# Patient Record
Sex: Female | Born: 1940 | Race: White | Hispanic: No | Marital: Single | Smoking: Never smoker
Health system: Southern US, Community
[De-identification: ages and names within clinical notes are randomized; demographics above are authoritative.]

## PROBLEM LIST (undated history)

## (undated) DIAGNOSIS — I10 Essential (primary) hypertension: Secondary | ICD-10-CM

## (undated) DIAGNOSIS — R079 Chest pain, unspecified: Secondary | ICD-10-CM

## (undated) DIAGNOSIS — G20 Parkinson's disease: Secondary | ICD-10-CM

## (undated) DIAGNOSIS — Z7289 Other problems related to lifestyle: Secondary | ICD-10-CM

## (undated) DIAGNOSIS — I5189 Other ill-defined heart diseases: Secondary | ICD-10-CM

## (undated) DIAGNOSIS — F411 Generalized anxiety disorder: Secondary | ICD-10-CM

## (undated) DIAGNOSIS — R296 Repeated falls: Secondary | ICD-10-CM

## (undated) DIAGNOSIS — G20A1 Parkinson's disease without dyskinesia, without mention of fluctuations: Secondary | ICD-10-CM

## (undated) DIAGNOSIS — Z789 Other specified health status: Secondary | ICD-10-CM

## (undated) DIAGNOSIS — F329 Major depressive disorder, single episode, unspecified: Secondary | ICD-10-CM

## (undated) DIAGNOSIS — E039 Hypothyroidism, unspecified: Secondary | ICD-10-CM

## (undated) DIAGNOSIS — F410 Panic disorder [episodic paroxysmal anxiety] without agoraphobia: Secondary | ICD-10-CM

## (undated) DIAGNOSIS — F32A Depression, unspecified: Secondary | ICD-10-CM

## (undated) DIAGNOSIS — W19XXXA Unspecified fall, initial encounter: Secondary | ICD-10-CM

## (undated) DIAGNOSIS — F1090 Alcohol use, unspecified, uncomplicated: Secondary | ICD-10-CM

## (undated) DIAGNOSIS — E785 Hyperlipidemia, unspecified: Secondary | ICD-10-CM

## (undated) HISTORY — DX: Hypothyroidism, unspecified: E03.9

## (undated) HISTORY — PX: AUGMENTATION MAMMAPLASTY: SUR837

## (undated) HISTORY — PX: DILATION AND CURETTAGE OF UTERUS: SHX78

## (undated) HISTORY — DX: Parkinson's disease: G20

## (undated) HISTORY — DX: Parkinson's disease without dyskinesia, without mention of fluctuations: G20.A1

---

## 2000-06-04 ENCOUNTER — Inpatient Hospital Stay (HOSPITAL_COMMUNITY): Admission: EM | Admit: 2000-06-04 | Discharge: 2000-06-07 | Payer: Self-pay | Admitting: *Deleted

## 2000-06-10 ENCOUNTER — Other Ambulatory Visit (HOSPITAL_COMMUNITY): Admission: RE | Admit: 2000-06-10 | Discharge: 2000-06-28 | Payer: Self-pay | Admitting: Psychiatry

## 2004-06-08 ENCOUNTER — Ambulatory Visit: Payer: Self-pay | Admitting: Psychiatry

## 2004-08-29 ENCOUNTER — Ambulatory Visit: Payer: Self-pay | Admitting: Psychiatry

## 2004-11-01 ENCOUNTER — Ambulatory Visit (HOSPITAL_COMMUNITY): Admission: RE | Admit: 2004-11-01 | Discharge: 2004-11-01 | Payer: Self-pay | Admitting: Family Medicine

## 2004-11-14 ENCOUNTER — Ambulatory Visit: Payer: Self-pay | Admitting: Psychiatry

## 2005-02-13 ENCOUNTER — Ambulatory Visit: Payer: Self-pay | Admitting: Psychiatry

## 2005-05-29 ENCOUNTER — Ambulatory Visit: Payer: Self-pay | Admitting: Psychiatry

## 2005-05-31 ENCOUNTER — Other Ambulatory Visit: Admission: RE | Admit: 2005-05-31 | Discharge: 2005-05-31 | Payer: Self-pay | Admitting: Dermatology

## 2005-08-28 ENCOUNTER — Ambulatory Visit: Payer: Self-pay | Admitting: Psychiatry

## 2005-10-30 ENCOUNTER — Ambulatory Visit (HOSPITAL_COMMUNITY): Payer: Self-pay | Admitting: Psychiatry

## 2005-11-20 ENCOUNTER — Ambulatory Visit (HOSPITAL_COMMUNITY): Payer: Self-pay | Admitting: Psychiatry

## 2005-12-27 ENCOUNTER — Ambulatory Visit (HOSPITAL_COMMUNITY): Payer: Self-pay | Admitting: Psychiatry

## 2006-02-26 ENCOUNTER — Ambulatory Visit (HOSPITAL_COMMUNITY): Payer: Self-pay | Admitting: Psychiatry

## 2006-04-25 ENCOUNTER — Ambulatory Visit (HOSPITAL_COMMUNITY): Payer: Self-pay | Admitting: Psychiatry

## 2006-06-27 ENCOUNTER — Ambulatory Visit (HOSPITAL_COMMUNITY): Payer: Self-pay | Admitting: Psychiatry

## 2006-08-27 ENCOUNTER — Ambulatory Visit (HOSPITAL_COMMUNITY): Payer: Self-pay | Admitting: Psychiatry

## 2006-09-03 ENCOUNTER — Ambulatory Visit (HOSPITAL_COMMUNITY): Payer: Self-pay | Admitting: Psychiatry

## 2006-10-04 ENCOUNTER — Ambulatory Visit (HOSPITAL_COMMUNITY): Payer: Self-pay | Admitting: Psychiatry

## 2006-10-31 ENCOUNTER — Ambulatory Visit (HOSPITAL_COMMUNITY): Payer: Self-pay | Admitting: Psychiatry

## 2006-11-04 ENCOUNTER — Ambulatory Visit (HOSPITAL_COMMUNITY): Payer: Self-pay | Admitting: Psychiatry

## 2006-12-04 ENCOUNTER — Ambulatory Visit (HOSPITAL_COMMUNITY): Payer: Self-pay | Admitting: Psychiatry

## 2006-12-24 ENCOUNTER — Ambulatory Visit (HOSPITAL_COMMUNITY): Payer: Self-pay | Admitting: Psychiatry

## 2007-02-25 ENCOUNTER — Ambulatory Visit (HOSPITAL_COMMUNITY): Payer: Self-pay | Admitting: Psychiatry

## 2007-04-24 ENCOUNTER — Ambulatory Visit (HOSPITAL_COMMUNITY): Payer: Self-pay | Admitting: Psychiatry

## 2007-05-22 ENCOUNTER — Ambulatory Visit (HOSPITAL_COMMUNITY): Payer: Self-pay | Admitting: Psychiatry

## 2007-07-15 ENCOUNTER — Ambulatory Visit (HOSPITAL_COMMUNITY): Payer: Self-pay | Admitting: Psychiatry

## 2007-09-09 ENCOUNTER — Ambulatory Visit (HOSPITAL_COMMUNITY): Payer: Self-pay | Admitting: Psychiatry

## 2007-12-16 ENCOUNTER — Ambulatory Visit (HOSPITAL_COMMUNITY): Payer: Self-pay | Admitting: Psychiatry

## 2008-02-12 ENCOUNTER — Ambulatory Visit (HOSPITAL_COMMUNITY): Payer: Self-pay | Admitting: Psychiatry

## 2008-04-19 ENCOUNTER — Ambulatory Visit (HOSPITAL_COMMUNITY): Admission: RE | Admit: 2008-04-19 | Discharge: 2008-04-19 | Payer: Self-pay | Admitting: Internal Medicine

## 2010-12-15 NOTE — Discharge Summary (Signed)
[facility]  Patient:    Wendy Reynolds, Wendy Reynolds                         MRN: [id number] Adm. Date:  [id number] Disc. Date: [id number] Attending:  Otilio Saber Dictator:   Candi Leash. Orsini, N.P.                           Discharge Summary  HISTORY OF PRESENT ILLNESS:  This client is a 70 year old white divorced female admitted on a voluntary basis for alcohol abuse with increased intake of alcohol the past weekend prior to admission.  The patient states that she has been drinking heavily, drinking approximately 4 L of wine per day.  The patient was drinking to induce sleep and because of loneliness.  The patient states that she has been a problem drinker for about the past 25 years, denies DTs, blackouts, or seizure.  Her sleep has been poor.  Her appetite has been poor.  She has had weight loss of about 4 pounds in about a month.  She also reports depression.  The patient denies any suicidal ideation or homicidal ideation.  The patient has decreased energy, decreased concentration, anhedonia, and some withdrawal.  PAST PSYCHIATRIC HISTORY:  The patient has had outpatient treatment for the past 11 years, sees Dr. Claudette Head, her psychiatrist at [facility].  PAST MEDICAL HISTORY:  Primary care Amma Crear is Dr. Sherwood Gambler with her medical problems including GERD.  Admission medications: Celexa 40 mg p.o. one q.a.m., BuSpar 50 mg one q.a.m. and q.h.s., Neurontin 100 mg q.a.m. and two q.h.s., Klonopin 1 mg q.h.s.  Drug allergies include ASPIRIN and PENICILLIN.  PHYSICAL EXAMINATION:  Physical examination was performed at [facility].  Blood pressure was mildly elevated at 142/94.  Laboratory data: TSH 8.866, slightly elevated.  CBC with differential: Normal limits.  CMET: Within normal limits except CO2 elevated at 72.  Blood alcohol was 6.  UA showed protein 30, bilirubin 1, leukocytes moderate, bacteria were many.  MENTAL STATUS  EXAMINATION:  Older white female casually dressed, cooperative, thin.  Speech is normal and relevant.  The patient does speak pretty fast. Mood is anxious.  Affect is anxious.  Denies any suicidal or homicidal ideation.  Thought processes are logical and coherent without evidence of psychosis.  No hallucinations, no delusions.  Cognitive: Alert and oriented, cognitive functioning is intact.  Judgment is poor.  ADMITTING DIAGNOSES: Axis I:    1. Major depression, recurrent.            2. Alcohol dependence. Axis II:   Deferred. Axis III:  Gastroesophageal reflux disease. Axis IV:   Severe related to economic problems and substance abuse. Axis V:    Current global assessment of functioning is 30, highest this past            year was 70.  HOSPITAL COURSE:  The patient was admitted to the [facility] for alcohol dependence, major depressive disorder.  The patient was continued on her routine medications and Klonopin was ordered on a q.h.s. basis. Phenobarbital protocol was initiated.  Fluids were ordered at a rate of six bottles per day.  The patient was considered to be a fall risk.  The patient was feeling better today.  Her mood and affect were brighter.  There was no suicidal thoughts, no severe withdrawal symptoms.  Detoxification protocol was continued in addition to her current  medicines.  CONDITION:  The patient reported feeling much better.  Her mood and affect were brighter.  She was sleeping and eating well.  There were no current withdrawal symptoms.  The patient denied any suicidal thoughts.  It was felt the patient could be discharged on an outpatient basis.  FOLLOWUP:  CD IOP on the evening of 06/10/00 at 4 p.m.  DISCHARGE MEDICATIONS: 1. Celexa 40 mg one tablet q.d. 2. BuSpar 50 mg one tablet b.i.d. 3. Neurontin 100 mg one tablet q.a.m., two q.h.s. 4. Klonopin 1 mg one tablet q.h.s. 5. Phenobarbital 15 mg one tablet b.i.d. for the next two  days.  DISCHARGE INSTRUCTIONS:  No activity or diet restrictions.  The patient was advised not to drink.  DISCHARGE DIAGNOSES: Axis I:    1. Major depression, recurrent.            2. Alcohol dependence. Axis II:   Deferred. Axis III:  Gastroesophageal reflux disease. Axis IV:   Severe related to economic problems and substance abuse. Axis V:    Current global assessment of functioning is 55, highest this past            year was 70. DD:  07/17/00 TD:  07/17/00 Job: [id number]

## 2012-03-04 ENCOUNTER — Encounter (HOSPITAL_COMMUNITY): Payer: Self-pay

## 2012-03-04 ENCOUNTER — Emergency Department (HOSPITAL_COMMUNITY): Payer: Medicare PPO

## 2012-03-04 ENCOUNTER — Observation Stay (HOSPITAL_COMMUNITY)
Admission: EM | Admit: 2012-03-04 | Discharge: 2012-03-05 | Disposition: A | Discharge status: Home / Self Care | Payer: Medicare PPO | Attending: Internal Medicine | Admitting: Internal Medicine

## 2012-03-04 DIAGNOSIS — F32A Depression, unspecified: Secondary | ICD-10-CM

## 2012-03-04 DIAGNOSIS — I519 Heart disease, unspecified: Secondary | ICD-10-CM | POA: Insufficient documentation

## 2012-03-04 DIAGNOSIS — I10 Essential (primary) hypertension: Secondary | ICD-10-CM

## 2012-03-04 DIAGNOSIS — I059 Rheumatic mitral valve disease, unspecified: Secondary | ICD-10-CM

## 2012-03-04 DIAGNOSIS — E785 Hyperlipidemia, unspecified: Secondary | ICD-10-CM

## 2012-03-04 DIAGNOSIS — R079 Chest pain, unspecified: Principal | ICD-10-CM

## 2012-03-04 DIAGNOSIS — I5189 Other ill-defined heart diseases: Secondary | ICD-10-CM

## 2012-03-04 DIAGNOSIS — F3289 Other specified depressive episodes: Secondary | ICD-10-CM | POA: Insufficient documentation

## 2012-03-04 DIAGNOSIS — F329 Major depressive disorder, single episode, unspecified: Secondary | ICD-10-CM | POA: Diagnosis present

## 2012-03-04 DIAGNOSIS — F411 Generalized anxiety disorder: Secondary | ICD-10-CM

## 2012-03-04 HISTORY — DX: Chest pain, unspecified: R07.9

## 2012-03-04 HISTORY — DX: Other ill-defined heart diseases: I51.89

## 2012-03-04 HISTORY — DX: Alcohol use, unspecified, uncomplicated: F10.90

## 2012-03-04 HISTORY — DX: Hyperlipidemia, unspecified: E78.5

## 2012-03-04 HISTORY — DX: Other problems related to lifestyle: Z72.89

## 2012-03-04 HISTORY — DX: Generalized anxiety disorder: F41.1

## 2012-03-04 HISTORY — DX: Essential (primary) hypertension: I10

## 2012-03-04 HISTORY — DX: Other specified health status: Z78.9

## 2012-03-04 HISTORY — DX: Panic disorder (episodic paroxysmal anxiety): F41.0

## 2012-03-04 HISTORY — DX: Depression, unspecified: F32.A

## 2012-03-04 HISTORY — DX: Major depressive disorder, single episode, unspecified: F32.9

## 2012-03-04 LAB — CBC WITH DIFFERENTIAL/PLATELET
HCT: 41.8 % (ref 36.0–46.0)
Lymphocytes Relative: 28 % (ref 12–46)
Lymphs Abs: 1.3 10*3/uL (ref 0.7–4.0)
MCHC: 34.4 g/dL (ref 30.0–36.0)
MCV: 91.1 fL (ref 78.0–100.0)
Platelets: 228 10*3/uL (ref 150–400)
RDW: 13.2 % (ref 11.5–15.5)

## 2012-03-04 LAB — BASIC METABOLIC PANEL
CO2: 28 mEq/L (ref 19–32)
GFR calc Af Amer: 82 mL/min — ABNORMAL LOW (ref >90)
GFR calc non Af Amer: 71 mL/min — ABNORMAL LOW (ref >90)
Glucose, Bld: 98 mg/dL (ref 70–99)
Sodium: 139 mEq/L (ref 135–145)

## 2012-03-04 LAB — CARDIAC PANEL(CRET KIN+CKTOT+MB+TROPI)
CK, MB: 4.9 ng/mL — ABNORMAL HIGH (ref 0.3–4.0)
CK, MB: 3.3 ng/mL (ref 0.3–4.0)
Relative Index: 2.7 — ABNORMAL HIGH (ref 0.0–2.5)
Total CK: 172 U/L (ref 7–177)
Troponin I: <0.3 ng/mL (ref <0.30)
Troponin I: <0.3 ng/mL (ref <0.30)

## 2012-03-04 LAB — HEPATIC FUNCTION PANEL
ALT: 19 U/L (ref 0–35)
AST: 29 U/L (ref 0–37)
Bilirubin, Direct: <0.1 mg/dL (ref 0.0–0.3)
Total Bilirubin: 0.4 mg/dL (ref 0.3–1.2)

## 2012-03-04 LAB — PROTIME-INR
INR: 0.95 (ref 0.00–1.49)
Prothrombin Time: 12.9 seconds (ref 11.6–15.2)

## 2012-03-04 LAB — TROPONIN I: Troponin I: <0.3 ng/mL (ref <0.30)

## 2012-03-04 MED ORDER — OMEGA-3-ACID ETHYL ESTERS 1 G PO CAPS
1.0000 g | ORAL_CAPSULE | Freq: Every day | ORAL | Status: DC
  Administered 2012-03-05: 1 g via ORAL
  Filled 2012-03-04: qty 1

## 2012-03-04 MED ORDER — ADULT MULTIVITAMIN W/MINERALS CH
1.0000 | ORAL_TABLET | Freq: Every day | ORAL | Status: DC
  Administered 2012-03-05: 1 via ORAL
  Filled 2012-03-04: qty 1

## 2012-03-04 MED ORDER — BUSPIRONE HCL 5 MG PO TABS
5.0000 mg | ORAL_TABLET | Freq: Two times a day (BID) | ORAL | Status: DC
  Administered 2012-03-04 – 2012-03-05 (×2): 5 mg via ORAL
  Filled 2012-03-04 (×2): qty 1

## 2012-03-04 MED ORDER — ASPIRIN 81 MG PO CHEW
81.0000 mg | CHEWABLE_TABLET | Freq: Every day | ORAL | Status: DC
  Administered 2012-03-05: 81 mg via ORAL
  Filled 2012-03-04: qty 1

## 2012-03-04 MED ORDER — MIRTAZAPINE 30 MG PO TBDP
ORAL_TABLET | ORAL | Status: AC
  Filled 2012-03-04: qty 1

## 2012-03-04 MED ORDER — AMLODIPINE BESYLATE 5 MG PO TABS
5.0000 mg | ORAL_TABLET | Freq: Every day | ORAL | Status: DC
  Administered 2012-03-05: 5 mg via ORAL
  Filled 2012-03-04: qty 1

## 2012-03-04 MED ORDER — BUPROPION HCL 100 MG PO TABS
100.0000 mg | ORAL_TABLET | Freq: Three times a day (TID) | ORAL | Status: DC
  Administered 2012-03-04: 100 mg via ORAL
  Filled 2012-03-04 (×3): qty 1

## 2012-03-04 MED ORDER — ALBUTEROL SULFATE (5 MG/ML) 0.5% IN NEBU: 2.5000 mg | INHALATION_SOLUTION | RESPIRATORY_TRACT | Status: DC | PRN

## 2012-03-04 MED ORDER — ACETAMINOPHEN 325 MG PO TABS: 650.0000 mg | ORAL_TABLET | Freq: Four times a day (QID) | ORAL | Status: DC | PRN

## 2012-03-04 MED ORDER — NITROGLYCERIN 0.4 MG SL SUBL
0.4000 mg | SUBLINGUAL_TABLET | SUBLINGUAL | Status: DC | PRN
  Administered 2012-03-04: 0.4 mg via SUBLINGUAL
  Filled 2012-03-04: qty 25

## 2012-03-04 MED ORDER — PANTOPRAZOLE SODIUM 40 MG PO TBEC
40.0000 mg | DELAYED_RELEASE_TABLET | Freq: Every day | ORAL | Status: DC
  Administered 2012-03-04 – 2012-03-05 (×2): 40 mg via ORAL
  Filled 2012-03-04 (×2): qty 1

## 2012-03-04 MED ORDER — ALUM & MAG HYDROXIDE-SIMETH 200-200-20 MG/5ML PO SUSP: 30.0000 mL | Freq: Four times a day (QID) | ORAL | Status: DC | PRN

## 2012-03-04 MED ORDER — ENOXAPARIN SODIUM 40 MG/0.4ML ~~LOC~~ SOLN
40.0000 mg | SUBCUTANEOUS | Status: DC
  Administered 2012-03-04: 40 mg via SUBCUTANEOUS
  Filled 2012-03-04: qty 0.4

## 2012-03-04 MED ORDER — POLYVINYL ALCOHOL 1.4 % OP SOLN
1.0000 [drp] | Freq: Two times a day (BID) | OPHTHALMIC | Status: DC
  Filled 2012-03-04: qty 15

## 2012-03-04 MED ORDER — POLYETHYL GLYCOL-PROPYL GLYCOL 0.4-0.3 % OP SOLN
1.0000 [drp] | Freq: Two times a day (BID) | OPHTHALMIC | Status: DC
  Administered 2012-03-04: 1 [drp] via OPHTHALMIC

## 2012-03-04 MED ORDER — BUPROPION HCL 75 MG PO TABS
ORAL_TABLET | ORAL | Status: AC
  Filled 2012-03-04: qty 2

## 2012-03-04 MED ORDER — ONDANSETRON HCL 4 MG PO TABS: 4.0000 mg | ORAL_TABLET | Freq: Four times a day (QID) | ORAL | Status: DC | PRN

## 2012-03-04 MED ORDER — MORPHINE SULFATE 2 MG/ML IJ SOLN: 2.0000 mg | INTRAMUSCULAR | Status: DC | PRN

## 2012-03-04 MED ORDER — ASPIRIN 81 MG PO CHEW
243.0000 mg | CHEWABLE_TABLET | Freq: Once | ORAL | Status: AC
  Administered 2012-03-04: 243 mg via ORAL
  Filled 2012-03-04: qty 3

## 2012-03-04 MED ORDER — ONDANSETRON HCL 4 MG/2ML IJ SOLN: 4.0000 mg | Freq: Four times a day (QID) | INTRAMUSCULAR | Status: DC | PRN

## 2012-03-04 MED ORDER — MIRTAZAPINE 30 MG PO TBDP
30.0000 mg | ORAL_TABLET | Freq: Every day | ORAL | Status: DC
  Administered 2012-03-04: 30 mg via ORAL
  Filled 2012-03-04: qty 1

## 2012-03-04 MED ORDER — ACETAMINOPHEN 650 MG RE SUPP: 650.0000 mg | Freq: Four times a day (QID) | RECTAL | Status: DC | PRN

## 2012-03-04 MED ORDER — OXYCODONE HCL 5 MG PO TABS: 5.0000 mg | ORAL_TABLET | ORAL | Status: DC | PRN

## 2012-03-04 NOTE — ED Notes (Signed)
MD at bedside. 

## 2012-03-04 NOTE — ED Provider Notes (Signed)
History    This chart was scribed for Brittanya Winburn B. Bernette Mayers, MD, MD by Smitty Pluck. The patient was seen in room APA05 and the patient's care was started at 9:57AM.   CSN: [id number]  Arrival date & time 03/04/12  4696   First MD Initiated Contact with Patient 03/04/12 [phone]      Chief Complaint  Patient presents with  . Chest Pain    (Consider location/radiation/quality/duration/timing/severity/associated sxs/prior treatment) The history is provided by the patient.   Wendy Reynolds is a 71 y.o. female who presents to the Emergency Department sent by PCP (Dr. Sherwood Gambler) complaining of moderate mid sternal chest pain radiating int neck onset 1 day ago. Pt reports that she has tightness feeling in her chest. Pain lasted a couple hours x2 yesterday and returned this AM. Pain has been constant since 6AM today. Denies SOB, nausea, emesis and diaphoresis. Pt has had 81mg  ASA this AM. Denies hx of heart problems and diabetes. Pt reports having HTN. Denies smoking cigarettes. Pt drinks 2-3 glasses of wine daily.  Past Medical History  Diagnosis Date  . Anxiety   . Panic   . Hypertension   . Depression     History reviewed. No pertinent past surgical history.  No family history on file.  History  Substance Use Topics  . Smoking status: Never Smoker   . Smokeless tobacco: Not on file  . Alcohol Use: Yes     occ    OB History    Grav Para Term Preterm Abortions TAB SAB Ect Mult Living                  Review of Systems  All other systems reviewed and are negative.  10 Systems reviewed and all are negative for acute change except as noted in the HPI.   Allergies  Erythromycin and Penicillins  Home Medications  No current outpatient prescriptions on file.  BP 127/74  Pulse 72  Temp 97.7 F (36.5 C) (Oral)  Resp 17  Ht 5\' 2"  (1.575 m)  Wt 102 lb (46.267 kg)  BMI 18.66 kg/m2  SpO2 94%  Physical Exam  Nursing note and vitals reviewed. Constitutional: She is oriented to  person, place, and time. She appears well-developed and well-nourished.  HENT:  Head: Normocephalic and atraumatic.  Eyes: EOM are normal. Pupils are equal, round, and reactive to light.  Neck: Normal range of motion. Neck supple.  Cardiovascular: Normal rate, normal heart sounds and intact distal pulses.   Pulmonary/Chest: Effort normal and breath sounds normal.  Abdominal: Bowel sounds are normal. She exhibits no distension. There is no tenderness.  Musculoskeletal: Normal range of motion. She exhibits no edema and no tenderness.  Neurological: She is alert and oriented to person, place, and time. She has normal strength. No cranial nerve deficit or sensory deficit.  Skin: Skin is warm and dry. No rash noted.  Psychiatric: She has a normal mood and affect.    ED Course  Procedures (including critical care time) DIAGNOSTIC STUDIES: Oxygen Saturation is 98% on room air, normal by my interpretation.    COORDINATION OF CARE: 10:01AM EDP discusses pt ED treatment with pt  10:01AM EDP orders medication: Scheduled Meds:    . aspirin  243 mg Oral Once   Continuous Infusions:  PRN Meds:.nitroGLYCERIN      Labs Reviewed  BASIC METABOLIC PANEL - Abnormal; Notable for the following:    GFR calc non Af Amer 71 (*)     GFR calc  Af Amer 82 (*)     All other components within normal limits  CARDIAC PANEL(CRET KIN+CKTOT+MB+TROPI) - Abnormal; Notable for the following:    CK, MB 4.9 (*)     Relative Index 2.8 (*)     All other components within normal limits  CBC WITH DIFFERENTIAL  TROPONIN I  CARDIAC PANEL(CRET KIN+CKTOT+MB+TROPI)  CARDIAC PANEL(CRET KIN+CKTOT+MB+TROPI)  HEPATIC FUNCTION PANEL  TSH  T4, FREE  LIPASE, BLOOD  PROTIME-INR   Dg Chest 2 View  03/04/2012  *RADIOLOGY REPORT*  Clinical Data: Chest pain.  CHEST - 2 VIEW  Comparison: 11/01/2004  Findings: Heart and mediastinal contours are within normal limits. No focal opacities or effusions.  No acute bony abnormality.   IMPRESSION: No active cardiopulmonary disease.  Original Report Authenticated By: Cyndie Chime, M.D.     1. Chest pain       MDM   Date: 03/04/2012  Rate: 70  Rhythm: normal sinus rhythm  QRS Axis: normal  Intervals: normal  ST/T Wave abnormalities: normal  Conduction Disutrbances: none  Narrative Interpretation: unremarkable       I personally performed the services described in the documentation, which were scribed in my presence. The recorded information has been reviewed and considered.   Labs and imaging unremarkable. Discussed with Dr. Sherrie Mustache who agrees to admit for rule out and further evaluation of her pain.       Cassidee Deats B. Bernette Mayers, MD 03/04/12 1453

## 2012-03-04 NOTE — H&P (Signed)
[facility] History and Physical  Wendy Reynolds:[id number] DOB: 1941-02-27 DOA: 03/04/2012  Referring physician: Effie Shy, M.D. PCP: Cassell Smiles., MD   Chief Complaint: Chest pain  HPI:  The patient is a 71 year old woman with a history significant for generalized anxiety disorder, depression, and hypertension, who presents to the emergency department today with chief complaint of chest pain. Her chest pain started yesterday morning while she was at work. She is a physical therapist. She describes the pain as a heaviness and tightness or pressure-like. It is located in the substernal/central chest area. At the time that it started, it was 4 or 5/10 in intensity. It was intermittent throughout the day. It finally resolved but then it came back. When she went home from work yesterday, she had no chest pain. In anticipation of going to work this morning, her chest pain returned, but with more pressure and more heaviness. There was nothing that made the pain better or worse. It did not improve or worsen with activity or rest. It was associated with radiation to both sides of her neck. There was no associated diaphoresis, nausea, lightheadedness, shortness of breath, pleurisy, cough, fever, or chills. She has had no recent chest wall pain or heartburn. She does admit to anxiety and sometimes a feeling of panic. Her depression she says is under control. She has had an increase in stress associated with work.  In the emergency department, she was given one sublingual nitroglycerin which decreased her pain from 5/10-0. She is hemodynamically stable and afebrile. Her troponin I is within normal limits. Her chest x-ray reveals no active disease. Her EKG reveals normal sinus rhythm with a heart rate of 70 beats per minute and nonspecific T-wave changes in lead 3. Her lab data is virtually unremarkable. She is being admitted for further evaluation and management.   Review of Systems:  As  above in history present illness. Otherwise negative.  Past Medical History  Diagnosis Date  . Panic   . Hypertension   . Depression   . Generalized anxiety disorder 03/04/2012  . Alcohol use    History reviewed. No pertinent past surgical history. Social History: She is divorced. She has 2 children. She is a respiratory therapist. She drinks 2-3 glasses of wine each night. She does not believe she has an alcohol problem. She denies tobacco and illicit drug use.   Allergies  Allergen Reactions  . Erythromycin Nausea And Vomiting  . Penicillins Hives   Family history: Her mother died of throat cancer. Her father had diabetes. He died of head trauma.  Prior to Admission medications   Medication Sig Start Date End Date Taking? Authorizing Provider  amLODipine (NORVASC) 5 MG tablet Take 5 mg by mouth daily.   Yes Historical Provider, MD  aspirin 81 MG chewable tablet Chew 81 mg by mouth daily.   Yes Historical Provider, MD  buPROPion (WELLBUTRIN) 100 MG tablet Take 100 mg by mouth 3 (three) times daily.   Yes Historical Provider, MD  busPIRone (BUSPAR) 5 MG tablet Take 5 mg by mouth 2 (two) times daily.   Yes Historical Provider, MD  fish oil-omega-3 fatty acids 1000 MG capsule Take 1 g by mouth daily.   Yes Historical Provider, MD  Multiple Vitamin (MULTIVITAMIN WITH MINERALS) TABS Take 1 tablet by mouth daily.   Yes Historical Provider, MD  Polyethyl Glycol-Propyl Glycol (SYSTANE OP) Place 1 drop into both eyes 2 (two) times daily.   Yes Historical Provider, MD   Physical Exam:  Filed Vitals:   03/04/12 0944 03/04/12 1022 03/04/12 1206  BP:  127/74 132/87  Pulse: 72 72 67  Temp: 97.7 F (36.5 C)    TempSrc: Oral    Resp: 22 17 16   Height: 5\' 2"  (1.575 m)    Weight: 46.267 kg (102 lb)    SpO2: 98% 94% 98%     General:  Pleasant 71 year old Caucasian woman sitting up in bed, in no acute distress.  Eyes: Pupils equal, round, and reactive to light. Extraocular movements are  intact. Conjunctivae are clear. Sclerae are white.  ENT: Oropharynx reveals moist because membranes. No posterior exudates or erythema.  Neck: Supple, no adenopathy, no thyromegaly, no JVD.  Cardiovascular: S1, S2, with no murmurs rubs or gallops.  Respiratory: Clear to auscultation bilaterally.  Abdomen: Positive bowel sounds, soft, nontender, nondistended.  Skin: Good turgor. No rashes.  Musculoskeletal: Pedal pulses palpable. No acute hot red joints. No pedal edema.  Psychiatric: She is alert and oriented x3. She is a little nervous. Otherwise she is pleasant and is speaking clearly.  Neurologic: Cranial nerves II through XII are intact. Strength is 5 over 5 throughout. Sensation is intact.  Labs on Admission:  Basic Metabolic Panel:  Lab 03/04/12 0981  NA 139  K 4.1  CL 100  CO2 28  GLUCOSE 98  BUN 17  CREATININE 0.81  CALCIUM 9.7  MG --  PHOS --   Liver Function Tests: No results found for this basename: AST:5,ALT:5,ALKPHOS:5,BILITOT:5,PROT:5,ALBUMIN:5 in the last 168 hours No results found for this basename: LIPASE:5,AMYLASE:5 in the last 168 hours No results found for this basename: AMMONIA:5 in the last 168 hours CBC:  Lab 03/04/12 0951  WBC 4.6  NEUTROABS 2.9  HGB 14.4  HCT 41.8  MCV 91.1  PLT 228   Cardiac Enzymes:  Lab 03/04/12 1129 03/04/12 0951  CKTOTAL 172 --  CKMB 4.9* --  CKMBINDEX -- --  TROPONINI <0.30 <0.30    BNP (last 3 results) No results found for this basename: PROBNP:3 in the last 8760 hours CBG: No results found for this basename: GLUCAP:5 in the last 168 hours  Radiological Exams on Admission: Dg Chest 2 View  03/04/2012  *RADIOLOGY REPORT*  Clinical Data: Chest pain.  CHEST - 2 VIEW  Comparison: 11/01/2004  Findings: Heart and mediastinal contours are within normal limits. No focal opacities or effusions.  No acute bony abnormality.  IMPRESSION: No active cardiopulmonary disease.  Original Report Authenticated By: Cyndie Chime, M.D.    EKG: As above in history of present illness  Assessment/Plan Active Problems:  Chest pain  Generalized anxiety disorder  HTN (hypertension)  Depression   38. 71 year old woman with history of chronic anxiety and hypertension, presents with atypical chest pain. She did have relief of of her chest pressure with one sublingual nitroglycerin. The patient does not have multiple risk factors for cardiac disease other than hypertension and age. She has no family history of heart disease. Her EKG has some T-wave abnormalities in the third lead, otherwise it appears to be unremarkable. Her initial cardiac enzymes are within normal limits, which is reassuring. She will be admitted for 24-hour observation for further evaluation. We will continue her chronic antidepressants and anxiolytics. We will add proton pump inhibitor therapy with Protonix. We will continue as needed supplemental nitroglycerin. Of note, she drinks 2-3 glasses of wine nightly. She will be monitored for signs of alcohol withdrawal syndrome.     Plan:  1. The patient will be admitted  for observation. 2. For treatment of her pain, will continue sublingual nitroglycerin as needed. Will add as needed analgesics orally and IV. 3. We will start Protonix. 4. We will continue her chronic medications. 5. For further evaluation, we will order a 2-D echocardiogram, cardiac enzymes, TSH, free T4, PT/INR, and fasting lipid panel.     Code Status: Full code  Disposition Plan: Home when clinically improved.  Time spent: One hour  [facility] Pager [phone]  If 7PM-7AM, please contact night-coverage [email or web address] Password [email or web address] 03/04/2012, 2:30 PM

## 2012-03-04 NOTE — ED Notes (Signed)
Mid sternal cp off/on since yesterday, radiates up neck --denies any sob, no n/v or fever

## 2012-03-04 NOTE — Progress Notes (Signed)
*  PRELIMINARY RESULTS* Echocardiogram 2D Echocardiogram has been performed.  Caswell Corwin 03/04/2012, 1:41 PM

## 2012-03-05 ENCOUNTER — Encounter (HOSPITAL_COMMUNITY): Payer: Self-pay | Admitting: Internal Medicine

## 2012-03-05 DIAGNOSIS — I5189 Other ill-defined heart diseases: Secondary | ICD-10-CM

## 2012-03-05 DIAGNOSIS — E785 Hyperlipidemia, unspecified: Secondary | ICD-10-CM

## 2012-03-05 DIAGNOSIS — I519 Heart disease, unspecified: Secondary | ICD-10-CM

## 2012-03-05 HISTORY — DX: Hyperlipidemia, unspecified: E78.5

## 2012-03-05 HISTORY — DX: Other ill-defined heart diseases: I51.89

## 2012-03-05 LAB — LIPID PANEL
Cholesterol: 235 mg/dL — ABNORMAL HIGH (ref 0–200)
HDL: 110 mg/dL (ref >39)
Total CHOL/HDL Ratio: 2.1 RATIO
Triglycerides: 116 mg/dL (ref <150)
VLDL: 23 mg/dL (ref 0–40)

## 2012-03-05 LAB — CBC
Hemoglobin: 13.8 g/dL (ref 12.0–15.0)
Platelets: 220 10*3/uL (ref 150–400)
RBC: 4.43 MIL/uL (ref 3.87–5.11)
WBC: 4.6 10*3/uL (ref 4.0–10.5)

## 2012-03-05 LAB — COMPREHENSIVE METABOLIC PANEL
AST: 27 U/L (ref 0–37)
BUN: 15 mg/dL (ref 6–23)
CO2: 28 mEq/L (ref 19–32)
Calcium: 9.5 mg/dL (ref 8.4–10.5)
Chloride: 104 mEq/L (ref 96–112)
Creatinine, Ser: 0.89 mg/dL (ref 0.50–1.10)
GFR calc non Af Amer: 63 mL/min — ABNORMAL LOW (ref >90)
Total Bilirubin: 0.6 mg/dL (ref 0.3–1.2)

## 2012-03-05 MED ORDER — MIRTAZAPINE 30 MG PO TBDP: 30.0000 mg | ORAL_TABLET | Freq: Every day | ORAL | Status: DC

## 2012-03-05 MED ORDER — ATORVASTATIN CALCIUM 10 MG PO TABS: 5.0000 mg | ORAL_TABLET | Freq: Every day | ORAL | Status: DC

## 2012-03-05 MED ORDER — OMEPRAZOLE 20 MG PO CPDR: 20.0000 mg | DELAYED_RELEASE_CAPSULE | Freq: Every day | ORAL | Status: DC

## 2012-03-05 MED FILL — Polyvinyl Alcohol Ophth Soln 1.4%: OPHTHALMIC | Qty: 15 | Status: AC

## 2012-03-05 NOTE — Discharge Summary (Signed)
Physician Discharge Summary  Wendy Reynolds ZOX:[id number] DOB: [birth year] DOA: 03/04/2012  PCP: Cassell Smiles., MD  Admit date: 03/04/2012 Discharge date: 03/05/2012  Recommendations for Outpatient Follow-up:  1. The patient was discharged to home. She will followup with Dr. Sherwood Gambler in one week.  Discharge Diagnoses:  Active Problems:  Chest pain  Generalized anxiety disorder  HTN (hypertension)  Depression  Diastolic dysfunction 1. Chest pain. Myocardial infarction ruled out. Likely anxiety related. 2. Grade 1 diastolic dysfunction per 2-D echocardiogram. Ejection fraction was 60-65%. No regional wall motion abnormalities. 3. Generalized anxiety disorder. 4. Depression. 5. Hypertension. 6. Hyperlipidemia. Fasting lipid profile revealed a total cholesterol of 235, triglycerides of 116, HDL cholesterol of 110, and LDL cholesterol of 102.   Discharge Condition: Improved.  Diet recommendation: Heart healthy.  Wt Readings from Last 3 Encounters:  03/04/12 49.896 kg (110 lb)    History of present illness:  The patient is a 71 year old woman with a history significant for generalized anxiety disorder, depression, and hypertension, who presented to the emergency department on 03/04/2012 with a chief complaint of chest pain. In the emergency department, she was given one sublingual nitroglycerin which decreased her pain from 5/10-0. She was afebrile and hemodynamically stable. Her troponin I was within normal limits. Her chest x-ray revealed no active disease. Her EKG revealed normal sinus rhythm with heart rate of 70 beats per minute and nonspecific T-wave changes. Her lab data were virtually unremarkable. She was admitted for further evaluation and management.  Hospital Course:  Given the patient's symptomatology and her temporal relationship with work, it was believed that her chest pain was anxiety driven. She was restarted on all of her chronic medications including the ones for depression  and anxiety. She was maintained on aspirin therapy and amlodipine for treatment of chronic hypertension. She was started on supportive treatment with as needed IV analgesics and as needed antiemetics.. Proton pump inhibitor therapy was started with Protonix. Sublingual nitroglycerin was ordered as needed for chest pain. For further evaluation, a number of studies were ordered. All of her cardiac enzymes were well within normal limits. Her TSH and free T4 were within normal limits. Her lipase and liver transaminases were within normal limits. Her followup EKG revealed normal sinus rhythm with no acute abnormalities. Her 2-D echocardiogram revealed preserved LV function, but with grade 1 diastolic dysfunction. The results of her fasting lipid profile were dictated above. Given these results, at the time of discharge, she was prescribed Lipitor.  The patient's pain completely resolved. In fact, she stated that she had the best sleep in the hospital that she has had in weeks. Upon further discussion, the patient had been becoming more anxious and stressed out at work. In fact, over the last couple days, in anticipation of going to work in the mornings, she develops palpitations and chest pain. She was advised to continue her chronic medications for anxiety. She was also encouraged to consider taken a vacation or cutting back on her hours at work. She was advised to discuss this further with her primary care physician.  Procedures: 1. 2-D echocardiogram:Study Conclusions  - Left ventricle: The cavity size was normal. Wall thickness was normal. Systolic function was normal. The estimated ejection fraction was in the range of 60% to 65%. Wall motion was normal; there were no regional wall motion abnormalities. Doppler parameters are consistent with abnormal left ventricular relaxation (grade 1 diastolic dysfunction). - Mitral valve: Mild regurgitation. - Tricuspid valve: Trivial regurgitation. Peak  gradient: 12mm Hg (  D). - Pericardium, extracardiac: There was no pericardial effusion. Transthoracic echocardiography. M-mode, complete 2D, spectral Doppler, and color Doppler. Height: Height: 157.5cm. Height: 62in. Weight: Weight: 46.3kg. Weight: 101.8lb. Body mass index: BMI: 18.7kg/m^2. Body surface area: BSA: 1.70m^2. Patient status: Inpatient. Location: Bedside.     Consultations:  None   Discharge Exam: Filed Vitals:   03/05/12 0455  BP: 137/85  Pulse: 70  Temp: 97.8 F (36.6 C)  Resp: 20   Filed Vitals:   03/04/12 1206 03/04/12 1300 03/04/12 2102 03/05/12 0455  BP: 132/87 125/86 135/84 137/85  Pulse: 67 65 69 70  Temp:  97.9 F (36.6 C) 98 F (36.7 C) 97.8 F (36.6 C)  TempSrc:  Oral Oral Oral  Resp: 16 16 20 20   Height:  5\' 2"  (1.575 m)    Weight:  49.896 kg (110 lb)    SpO2: 98% 98% 98% 98%    General: Pleasant 71 year old Caucasian woman sitting up in bed, in no acute distress.  Cardiovascular:S1, S2, with no murmurs rubs or gallops.  Respiratory: Clear to auscultation bilaterally. Neuropsychiatric: Pleasant affect. No anxiety or anxiousness.   Discharge Instructions  Discharge Orders    Future Orders Please Complete By Expires   Diet - low sodium heart healthy      Increase activity slowly        Medication List  As of 03/05/2012  9:22 AM   TAKE these medications         amLODipine 5 MG tablet   Commonly known as: NORVASC   Take 5 mg by mouth daily.      aspirin 81 MG chewable tablet   Chew 81 mg by mouth daily.      atorvastatin 10 MG tablet   Commonly known as: LIPITOR   Take 0.5 tablets (5 mg total) by mouth daily.      buPROPion 100 MG tablet   Commonly known as: WELLBUTRIN   Take 100 mg by mouth 3 (three) times daily.      busPIRone 5 MG tablet   Commonly known as: BUSPAR   Take 5 mg by mouth 2 (two) times daily.      fish oil-omega-3 fatty acids 1000 MG capsule   Take 1 g by mouth daily.      mirtazapine 30 MG  disintegrating tablet   Commonly known as: REMERON SOL-TAB   Take 1 tablet (30 mg total) by mouth at bedtime.      multivitamin with minerals Tabs   Take 1 tablet by mouth daily.      omeprazole 20 MG capsule   Commonly known as: PRILOSEC   Take 1 capsule (20 mg total) by mouth daily.      SYSTANE OP   Place 1 drop into both eyes 2 (two) times daily.           Follow-up Information    Follow up with Cassell Smiles., MD. Schedule an appointment as soon as possible for a visit in 1 week.   Contact information:   [address] [place] [address] [phone]           The results of significant diagnostics from this hospitalization (including imaging, microbiology, ancillary and laboratory) are listed below for reference.    Significant Diagnostic Studies: Dg Chest 2 View  03/04/2012  *RADIOLOGY REPORT*  Clinical Data: Chest pain.  CHEST - 2 VIEW  Comparison: 11/01/2004  Findings: Heart and mediastinal contours are within normal limits. No focal opacities or  effusions.  No acute bony abnormality.  IMPRESSION: No active cardiopulmonary disease.  Original Report Authenticated By: Cyndie Chime, M.D.    Microbiology: No results found for this or any previous visit (from the past 240 hour(s)).   Labs: Basic Metabolic Panel:  Lab 03/05/12 4098 03/04/12 0951  NA 139 139  K 4.1 4.1  CL 104 100  CO2 28 28  GLUCOSE 94 98  BUN 15 17  CREATININE 0.89 0.81  CALCIUM 9.5 9.7  MG -- --  PHOS -- --   Liver Function Tests:  Lab 03/05/12 0519 03/04/12 1129  AST 27 29  ALT 17 19  ALKPHOS 57 57  BILITOT 0.6 0.4  PROT 6.6 6.8  ALBUMIN 3.7 3.8    Lab 03/04/12 1129  LIPASE 32  AMYLASE --   No results found for this basename: AMMONIA:5 in the last 168 hours CBC:  Lab 03/05/12 0519 03/04/12 0951  WBC 4.6 4.6  NEUTROABS -- 2.9  HGB 13.8 14.4  HCT 40.7 41.8  MCV 91.9 91.1  PLT 220 228   Cardiac Enzymes:  Lab 03/04/12 2313  03/04/12 1726 03/04/12 1129 03/04/12 0951  CKTOTAL 122 160 172 --  CKMB 3.3 4.2* 4.9* --  CKMBINDEX -- -- -- --  TROPONINI <0.30 <0.30 <0.30 <0.30   BNP: BNP (last 3 results) No results found for this basename: PROBNP:3 in the last 8760 hours CBG: No results found for this basename: GLUCAP:5 in the last 168 hours  Time coordinating discharge: Less than 35  minutes  Signed:  Brannon Levene  [facility] 03/05/2012, 9:22 AM

## 2012-03-05 NOTE — Progress Notes (Signed)
Utilization Review Complete  

## 2012-03-05 NOTE — Progress Notes (Signed)
Patient discharged home.  Instructions on new medications given and follow up appointment in place.  Patient verbalizes understanding of instructions.  Given prescriptions for new meds.  IV removed - WNL.  No questions at this time or chest pain.  Patient stable for discharge

## 2012-04-24 ENCOUNTER — Ambulatory Visit (INDEPENDENT_AMBULATORY_CARE_PROVIDER_SITE_OTHER): Payer: Medicare PPO | Admitting: Psychiatry

## 2012-04-24 ENCOUNTER — Encounter (HOSPITAL_COMMUNITY): Payer: Self-pay | Admitting: Psychiatry

## 2012-04-24 DIAGNOSIS — F419 Anxiety disorder, unspecified: Secondary | ICD-10-CM

## 2012-04-24 DIAGNOSIS — F411 Generalized anxiety disorder: Secondary | ICD-10-CM

## 2012-04-24 DIAGNOSIS — F339 Major depressive disorder, recurrent, unspecified: Secondary | ICD-10-CM

## 2012-04-24 MED ORDER — CITALOPRAM HYDROBROMIDE 20 MG PO TABS: 20.0000 mg | ORAL_TABLET | Freq: Every day | ORAL | Status: DC

## 2012-04-24 MED ORDER — LORAZEPAM 0.5 MG PO TABS: 0.5000 mg | ORAL_TABLET | Freq: Every day | ORAL | Status: DC

## 2012-04-24 NOTE — Progress Notes (Addendum)
Chief complaint I'm having a lot of anxiety.  History of presenting illness Patient is 71 year old Caucasian divorced currently unemployed female self-referred for seeking treatment for her depression anxiety.  Patient is known to this office from past.  She was seeing in this office since 2003 until 2009 she stop coming and getting treatment from primary care physician.  She was getting Effexor amitriptyline and Xanax however her primary care physician switched from Effexor to Wellbutrin and her amitriptyline was stopped.  Patient felt that Effexor was making her blood pressure up and she was feeling more hyper.  Now she is more anxious and nervous. Her biggest concern is her job.  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  3 weeks ago she stopped working with the hope that she will get a new job at [employer].  She supposed to start work next Tuesday however she has not received any letter.  Patient endorse that she has a lot of anxiety and nervousness.  6 weeks ago she endorse chest pain and she was taken to the emergency room however it turned out to be anxiety related.  Patient endorse poor sleep racing thoughts anxiety spells and they nervous about her future.  She endorse poor energy poor concentration and anhedonia.  She is more isolated and withdrawn from the past.  She stays most of the time by herself.  She use to enjoy spending time with her horses however lately she has been not spending time with them.  She felt that Wellbutrin is not working very well.  Recently her dose has been increased by her primary care physician however she feel more tremulous and have difficulty in writing.  She sleeping 3-4 hours.  She is taking Xanax and Remeron and BuSpar along with her Wellbutrin.  She denies any  active or passive suicidal thoughts.  She denies any hallucination idiot she denies any agitation anger or paranoid thinking.   Current psychiatric medication Xanax 0.5 mg up to 4 times a day Remeron 30 mg at bedtime Wellbutrin 100 mg 3 times a day BuSpar 5 mg twice a day All of psychiatric medication as given by Dr. Sherwood Gambler who is her primary care physician.  Past psychiatric history Patient has a long history of depression and anxiety.  She denies any previous history of suicidal attempt or any inpatient psychiatric treatment.  However she endorse one admission do to alcohol-related.  In the past she has taken numerous psychotropic medication.  She was seeing in this office since 2003 and taken Prozac, Effexor, amitriptyline, Cymbalta, trazodone, Celexa, Neurontin and Ambien.  Patient denies any history of psychosis paranoia or manic-like symptoms.  Family history Patient endorse multiple family member had psychiatric illness.  Her grandmother, cousins, mother has significant depression and had tried suicidal attempt.  Her daughter also tried suicidal attempt and currently living with the patient.  Psychosocial history Patient lives with her daughter who had recently had suicidal attempt.  Patient has one son who lives in [place] however patient has limited contact with him.  Patient has been divorced.  Patient denies any history of physical sexual or verbal abuse.  Patient enjoys horse riding.  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  Alcohol and substance use history Patient admitted history of alcohol abuse in  the past.  She has at least one admission due to alcohol-related.  She continues to drink at least one drink every night.  Patient denies any recent intoxication withdrawal symptoms.  She denies any illegal substance use.  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  Recently she quit her work and hoping to join [employer].  Medical history Patient see Dr. Sherwood Gambler.  She has a history of hyperlipidemia and hypertension.  Recently she was admitted for chest pain.  Mental status examination.   Patient is elderly woman who is casually dressed and fairly groomed.  She appears very anxious and tremulous.  Her speech is fast at times rambling.  Her thought process is also fast but logical linear and goal-directed.  She described her mood is very anxious and her affect is mood appropriate.  She was tremulous and has difficulty writing.  Her attention and concentration is distracted.  She denies any active or passive suicidal thoughts or homicidal thoughts.  She denies any auditory or visual hallucination.  There were no psychotic symptoms present at this time.  She has good fund of knowledge.  She's cooperative.  There was no flight of idea or loose association.  She's alert and oriented x3.  Her insight judgment and impulse control is okay.  Assessment Axis I generalized anxiety disorder, Maj. depressive disorder recurrent, alcohol abuse by history Axis II deferred Axis III hypertension and hyperlipidemia Axis IV moderate Axis V 65-70  Plan I talked with the patient in length about her symptoms.  I do believe her symptoms of anxiety is getting worse.  Patient has noticed more tremors and anxiety since Wellbutrin increase.  She also taking Xanax at bedtime for sleep however she tried during the day which making her very sedated.  She is very nervous about her job and finances.  I recommend to stop Wellbutrin and try Celexa which she had taken in the past when she was seeing in this office with fairly good response.  I also recommend to try Ativan however we talk in detail to stop drinking due to interaction of her psychiatric illness, medication.  Patient acknowledged and agreed.  I also recommend to see therapist for coping and social skills.  She'll continue her BuSpar and Remeron. I will see her again in 2 weeks.   Talked about safety plan that anytime having suicidal thoughts or homicidal thoughts than she need to call 911 or go to local emergency room.  Time spent 60 minutes.  Portion of this note is generated with voice dictation software and may contain typographical error.         stopped working in a nursing facility but she stopped working here a half ago when she do not like her job.

## 2012-04-28 ENCOUNTER — Ambulatory Visit (INDEPENDENT_AMBULATORY_CARE_PROVIDER_SITE_OTHER): Payer: Medicare PPO | Admitting: Psychology

## 2012-04-28 DIAGNOSIS — F419 Anxiety disorder, unspecified: Secondary | ICD-10-CM

## 2012-04-28 DIAGNOSIS — F411 Generalized anxiety disorder: Secondary | ICD-10-CM

## 2012-04-29 ENCOUNTER — Encounter (HOSPITAL_COMMUNITY): Payer: Self-pay | Admitting: Psychology

## 2012-04-29 NOTE — Progress Notes (Signed)
Patient:  Wendy Reynolds   DOB: 10-26-1940  MR Number: [id number]  Location: [facility] [address] [place] [address] Dept: [phone]  Start: 3 PM End: 4 PM  Provider/Observer:     Hershal Coria PSYD  Chief Complaint:      Chief Complaint  Patient presents with  . Anxiety  . Depression  . Stress    Reason For Service:     The patient is referred by her treating psychiatrist Dr. Lolly Mustache.   the patient was seen previously in our practice by Dr. Katrinka Blazing and is returned because of significant return of her symptoms. She describes significant anxiety and nervousness and clearly displayed a great deal of shaking in her hands and observable features of anxiety and worry. The patient reports that she had been seeing her primary care physician for her medications after she stopped seeing Dr. Katrinka Blazing. She reports that she stopped taking Effexor and started taking Wellbutrin and did fairly well. 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 She reports that they were  Extremely stressful towards her and that she ended up having to leave this job or was let go when they found out that she was going to start another job in a month. The patient has started his new job that is very stressful for her as there are a lot of chronic medical record she is having to learn. The patient also describes significant family stress in the past. The patient reports that her daughter had been diagnosed with borderline personality disorder and during one of her numerous suicide attempts she was hospitalized at the [facility] and upon discharge she promptly went up to the top of the parking deck and jumped off head first. She suffered significant spinal cord injury along with numerous other medical and physical injuries. Her  daughter has recovered to some degree and is able to walk a very short distance with a cane now and use her right arm to some degree.   Interventions Strategy:   cognitive/behavioral psychotherapeutic interventions.    Participation Level:   Active  Participation Quality:  The patient participated actively but was clearly in extreme anxiety at least during initial portion of our visit.      Behavioral Observation:  Well Groomed, Alert, and Appropriate.   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 The patient reports that she is struggling to learn the electronic medical records but that this job has to be a better situation and place her in her previous job.   Content of Session:    review current symptoms and continue to work on therapeutic interventions for issues related to anxiety and stress responses.   Current Status:    the patient continues to experience significant levels of anxiety which has been a long-term issue for her.   Patient Progress:    stable  Target Goals:    target goals include reducing the intensity, frequency, and duration of significant anxiety symptoms.   Last Reviewed:    04/28/2012   Goals Addressed Today:     today we worked on coping skills primarily related to cognitive interventions around issues of anxiety as well as  some baseline behavioral issues such as increasing walking, watching her diet, and working on sleep hygiene issues.   Impression/Diagnosis:    the patient has a long history of significant anxiety and has been treated for many years including through our office. The patient has been treated with SSRI medications in the past and then was switched to Wellbutrin. She is now added back in SSRI medication.   Diagnosis:    Axis I:  1. Anxiety disorder         Axis II: No diagnosis

## 2012-05-06 ENCOUNTER — Encounter (HOSPITAL_COMMUNITY): Payer: Self-pay | Admitting: Psychiatry

## 2012-05-06 ENCOUNTER — Ambulatory Visit (INDEPENDENT_AMBULATORY_CARE_PROVIDER_SITE_OTHER): Payer: Self-pay | Admitting: Psychiatry

## 2012-05-06 VITALS — Wt 112.0 lb

## 2012-05-06 DIAGNOSIS — F411 Generalized anxiety disorder: Secondary | ICD-10-CM

## 2012-05-06 DIAGNOSIS — F419 Anxiety disorder, unspecified: Secondary | ICD-10-CM

## 2012-05-06 DIAGNOSIS — F339 Major depressive disorder, recurrent, unspecified: Secondary | ICD-10-CM

## 2012-05-06 MED ORDER — CITALOPRAM HYDROBROMIDE 10 MG PO TABS: ORAL_TABLET | ORAL | Status: DC

## 2012-05-06 MED ORDER — LORAZEPAM 0.5 MG PO TABS: 0.5000 mg | ORAL_TABLET | Freq: Every day | ORAL | Status: DC

## 2012-05-06 NOTE — Progress Notes (Signed)
Chief complaint I am doing better on Celexa.  History of presenting illness Patient came for her followup appointment.  She was last seen on September 26.  She was started on Celexa and her Wellbutrin was stopped.  She is feeling somewhat better on Celexa.  She denies any jitteriness or tremors.  Her anxiety and depression is also less intense from the past.  She stopped taking Xanax.  She is taking Ativan which is helping her anxiety and sleep.  Last week she was very worried when her previous job called to complete paper which was left .  Patient became very nervous however she was able to go and finished the paperwork.  She is happy now.  She got the job at [employer] .  She's also spending time with her horses .  She sleeping better.  She is taking Remeron but Ativan .  She's not drinking at she relies drinking may cause further worsening of her illness.  She is tolerating her Celexa.  She denies any agitation anger mood swing.  The patient feel her BuSpar is doing nothing and wondering if she can stop taking it.  She denies any tremors or shakes.  Overall she feels better .  Her appetite is also better.  She has gained some weight from the past.  She start seeing therapist and have a good session.  Current psychiatric medication Ativan 0.5 mg at bedtime  Celexa 20 mg daily Remeron 30 mg at bedtime given by primary care physician.  Past psychiatric history Patient has a long history of depression and anxiety.  She denies any previous history of suicidal attempt or any inpatient psychiatric treatment.  However she endorse one admission do to alcohol-related.  In the past she has taken numerous psychotropic medication.  She was seeing in this office since 2003 and taken Prozac, Effexor, amitriptyline, Cymbalta, trazodone, Celexa, Neurontin and Ambien.  Patient denies any history of psychosis paranoia or manic-like symptoms.  Family history Patient endorse multiple family member had  psychiatric illness.  Her grandmother, cousins, mother has significant depression and had tried suicidal attempt.  Her daughter also tried suicidal attempt and currently living with the patient.  Psychosocial history Patient lives with her daughter who had recently had suicidal attempt.  Patient has one son who lives in [place] however patient has limited contact with him.  Patient has been divorced.  Patient denies any history of physical sexual or verbal abuse.  Patient enjoys horse riding.  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  Alcohol and substance use history Patient admitted history of alcohol abuse in the past.  She has at least one admission due to alcohol-related.  She was drinking at least one drink every night.  However patient claimed to be sober since her last visit.    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  Medical history Patient see Dr. Sherwood Gambler.  She has a history of hyperlipidemia and hypertension.  Recently she was admitted for chest pain.  Mental status examination.   Patient is elderly woman who is casually dressed and fairly groomed.  She appears anxious but cooperative.  She maintained good eye contact.  Her speech is fast with normal tone and volume.  Her thought process is also logical.  There were no flight of ideas or loose association.  She denies any active or passive suicidal thoughts or homicidal thoughts.  She described her mood is better  and her affect is improved from the past.  She denies any auditory or visual hallucination.  Her fund of knowledge is adequate.  There were no psychotic symptoms present at this time.  Her attention and concentration is improved from the past.  She's alert and oriented x3.  Her insight judgment and impulse control is okay.  Assessment Axis I generalized anxiety disorder, Maj. depressive disorder recurrent, alcohol abuse by history Axis II deferred Axis  III hypertension and hyperlipidemia Axis IV moderate Axis V 65-70  Plan I discussed in detail the risk and benefits of medication.  Her anxiety is much improved however she continued to have residual symptoms.  I recommend to try Celexa 30 mg.  I will discontinue BuSpar.  She will take Ativan as prescribed.  Her Remeron is given by her primary care physician.  I recommend to see therapist regularly.  We talk about alcohol use and interaction with her medication however patient claimed to be sober since her last visit.  I recommend to call us if she has any question or concern about the medication if she feels worsening of the symptom.  Greater than 50% of the time spent on counseling and coordination of care.  Time spent 25 minutes.  I also discussed that is a possibility she may see a new psychiatrist in the future since I moving full-time in [place] office.  Followup in 4 weeks.  Portion of this note is generated with voice dictation software and may contain typographical error.

## 2012-06-02 ENCOUNTER — Ambulatory Visit (HOSPITAL_COMMUNITY): Payer: Self-pay | Admitting: Psychology

## 2012-06-05 ENCOUNTER — Ambulatory Visit (HOSPITAL_COMMUNITY): Payer: Self-pay | Admitting: Psychiatry

## 2012-06-16 ENCOUNTER — Ambulatory Visit (HOSPITAL_COMMUNITY): Payer: Self-pay | Admitting: Psychiatry

## 2012-06-16 ENCOUNTER — Telehealth (HOSPITAL_COMMUNITY): Payer: Self-pay | Admitting: *Deleted

## 2012-06-17 ENCOUNTER — Encounter (HOSPITAL_COMMUNITY): Payer: Self-pay | Admitting: Psychiatry

## 2012-06-17 ENCOUNTER — Ambulatory Visit (INDEPENDENT_AMBULATORY_CARE_PROVIDER_SITE_OTHER): Payer: Self-pay | Admitting: Psychiatry

## 2012-06-17 VITALS — BP 120/84 | HR 64 | Ht 63.5 in | Wt 111.6 lb

## 2012-06-17 DIAGNOSIS — F339 Major depressive disorder, recurrent, unspecified: Secondary | ICD-10-CM

## 2012-06-17 DIAGNOSIS — F411 Generalized anxiety disorder: Secondary | ICD-10-CM

## 2012-06-17 DIAGNOSIS — F5105 Insomnia due to other mental disorder: Secondary | ICD-10-CM | POA: Insufficient documentation

## 2012-06-17 DIAGNOSIS — F419 Anxiety disorder, unspecified: Secondary | ICD-10-CM

## 2012-06-17 DIAGNOSIS — F329 Major depressive disorder, single episode, unspecified: Secondary | ICD-10-CM

## 2012-06-17 MED ORDER — MIRTAZAPINE 15 MG PO TABS: 7.5000 mg | ORAL_TABLET | Freq: Every day | ORAL | Status: DC

## 2012-06-17 MED ORDER — GABAPENTIN 100 MG PO CAPS: 100.0000 mg | ORAL_CAPSULE | Freq: Three times a day (TID) | ORAL | Status: DC

## 2012-06-17 MED ORDER — CITALOPRAM HYDROBROMIDE 10 MG PO TABS: ORAL_TABLET | ORAL | Status: DC

## 2012-06-17 NOTE — Progress Notes (Signed)
Chief complaint I am doing better on Celexa.  I am sleeping well on Ativan.  I usually wake up one time, but I fall back asleep just fine.   History of presenting illness Patient came for her followup appointment.  She reports that she has stopped the Xanax all together.  She has one time middle insomnia each night.  This primarily occurs on days that she works.  She is facing higher and higher levels of productivity expectations.  She gets most anxious when she has time on her hands.  Discussed with her what she does for her spiritual practice.  She reports that she struggles with that in that she has been described as "spit on a hot griddle".  Offered her some music that might help her calm down so she can meditate in a more serene way.  Mentioned to her that Ativan is actually alcohol in pill form.  She reports that she was in AA for a few years, but did not relate well to that.  She got back into drinking some wine, but has been able to stop herself and get back into horseback riding.  Her chiropractor has gotten her into some protein diet plans and is bulking up some with her exercise.  NO chest pain recently.   Current psychiatric medication Ativan 0.5 mg at bedtime  Celexa 20 mg daily Remeron 30 mg at bedtime given by primary care physician.  Past psychiatric history Patient has a long history of depression and anxiety.  She denies any previous history of suicidal attempt or any inpatient psychiatric treatment.  However she endorse one admission do to alcohol-related.  In the past she has taken numerous psychotropic medication.  She was seeing in this office since 2003 and taken Prozac, Effexor, amitriptyline, Cymbalta, trazodone, Celexa, Neurontin and Ambien.  Patient denies any history of psychosis paranoia or manic-like symptoms.  Family history Patient endorse multiple family member had psychiatric illness.  Her grandmother, cousins, mother has significant depression and had tried suicidal  attempt.  Her daughter also tried suicidal attempt and currently living with the patient.  Psychosocial history Patient lives with her daughter who had recently had suicidal attempt.  Patient has one son who lives in [place] however patient has limited contact with him.  Patient has been divorced.  Patient denies any history of physical sexual or verbal abuse.  Patient enjoys horse riding.  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  Alcohol and substance use history Patient admitted history of alcohol abuse in the past.  She has at least one admission due to alcohol-related.  She was drinking at least one drink every night.  However patient claimed to be sober since her last visit.    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  Medical history Patient see Dr. Sherwood Gambler.  She has a history of hyperlipidemia and hypertension.  Recently she was admitted for chest pain in May.  No chest pain since.  Mental status examination.   Patient is elderly woman who is casually dressed and fairly groomed.  She appears anxious but cooperative.  She maintained good eye contact.  Her speech is fast with normal tone and volume.  Her thought process is also logical.  There were no flight of ideas or loose association.  She denies any active or passive suicidal thoughts or homicidal thoughts.  She described her mood is better and her affect is improved from the  past.  She denies any auditory or visual hallucination.  Her fund of knowledge is adequate.  There were no psychotic symptoms present at this time.  Her attention and concentration is improved from the past.  She's alert and oriented x3.  Her insight judgment and impulse control is okay.  Assessment Axis I generalized anxiety disorder, Maj. depressive disorder recurrent, alcohol abuse by history Axis II deferred Axis III hypertension and hyperlipidemia Axis IV moderate Axis V  65-70  Plan I reviewed CC, tobacco/med/surg Hx, meds effects/ side effects, problem list, therapies and responses as well as her current status and how she is managing that.  She was intrigued by the use of music for meditation.  She is agreeable with switching off the Ativan and onto Neurontin.  Discussed with her taking fish oil twice a day to help rehab her brain.  Also challenge her to use beholding beauty every day to help with her spiritual practices.

## 2012-06-17 NOTE — Patient Instructions (Addendum)
May take 1 or 2 or 3 Neurontin for sleep.  Take Fish oil twice a day to help rebuild the brain.  "I am Wishes F[employer]illed Meditation" by Marylene Buerger and Lyndal Pulley may be helpful for meditation  Music can help still your spirit to get better meditation experience.   "Native Wisdom for [employer] by Camelia Phenes could be very helpful.  MAKE TIME for yourself  MAKE TIME FOR MEDITATION.

## 2012-07-14 ENCOUNTER — Encounter (HOSPITAL_COMMUNITY): Payer: Self-pay | Admitting: Psychiatry

## 2012-07-14 ENCOUNTER — Ambulatory Visit (INDEPENDENT_AMBULATORY_CARE_PROVIDER_SITE_OTHER): Payer: Medicare HMO | Admitting: Psychiatry

## 2012-07-14 ENCOUNTER — Telehealth (HOSPITAL_COMMUNITY): Payer: Self-pay | Admitting: Psychiatry

## 2012-07-14 VITALS — Wt 114.2 lb

## 2012-07-14 DIAGNOSIS — F411 Generalized anxiety disorder: Secondary | ICD-10-CM

## 2012-07-14 DIAGNOSIS — F339 Major depressive disorder, recurrent, unspecified: Secondary | ICD-10-CM

## 2012-07-14 DIAGNOSIS — F329 Major depressive disorder, single episode, unspecified: Secondary | ICD-10-CM

## 2012-07-14 DIAGNOSIS — F5105 Insomnia due to other mental disorder: Secondary | ICD-10-CM

## 2012-07-14 DIAGNOSIS — F419 Anxiety disorder, unspecified: Secondary | ICD-10-CM

## 2012-07-14 MED ORDER — CITALOPRAM HYDROBROMIDE 20 MG PO TABS: 30.0000 mg | ORAL_TABLET | Freq: Every day | ORAL | Status: DC

## 2012-07-14 MED ORDER — GABAPENTIN 100 MG PO CAPS: 100.0000 mg | ORAL_CAPSULE | Freq: Three times a day (TID) | ORAL | Status: DC

## 2012-07-14 MED ORDER — MIRTAZAPINE 15 MG PO TABS: 7.5000 mg | ORAL_TABLET | Freq: Every day | ORAL | Status: DC

## 2012-07-14 NOTE — Telephone Encounter (Signed)
Phone message completed in the phone message section.  

## 2012-07-14 NOTE — Progress Notes (Signed)
Chief complaint Chief Complaint  Patient presents with  . Anxiety  . Depression  . Follow-up  . Medication Refill   Subjective: "My job is driving me totally crazy.  It is burning me out.  I have gotten the book and am listening to the meditation some".  History of presenting illness Patient came for her followup appointment.  Pt reports that she is compliant with the psychotropic medications with some benefit and some side effects.  I'm sleeping better and not getting up and eatting in the middle of the night.  I am finding the book very helpful.  Pt asks how to take the Neurontin I suggested that it works best three times a day.   She is off the Klonopin and not noticing much  Improvement in her mentation. She shakes when she gets nervous.  I suggested that she ask her PCP to switch her to inderal for help her BP and her anxiety.  She was most excited about that idea.  She seems very hungry for suggestions on how to help her condition.  Offered her some ideas on how to deal some with her employer and how to structure her work space to help her stay calm.  Current psychiatric medication Celexa 30 mg daily Remeron 30 mg at bedtime given by primary care physician.  Past psychiatric history Patient has a long history of depression and anxiety.  She denies any previous history of suicidal attempt or any inpatient psychiatric treatment.  However she endorse one admission do to alcohol-related.  In the past she has taken numerous psychotropic medication.  She was seeing in this office since 2003 and taken Prozac, Effexor, amitriptyline, Cymbalta, trazodone, Celexa, Neurontin and Ambien.  Patient denies any history of psychosis paranoia or manic-like symptoms.  Family history Patient endorse multiple family member had psychiatric illness.  Her grandmother, cousins, mother has significant depression and had tried suicidal attempt.  Her daughter also tried suicidal attempt and currently living with the  patient.  Psychosocial history Patient lives with her daughter who had recently had suicidal attempt.  Patient has one son who lives in [place] however patient has limited contact with him.  Patient has been divorced.  Patient denies any history of physical sexual or verbal abuse.  Patient enjoys horse riding.  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  Alcohol and substance use history Patient admitted history of alcohol abuse in the past.  She has at least one admission due to alcohol-related.  She was drinking at least one drink every night.  However patient claimed to be sober since her last visit.    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  Medical history Patient see Dr. Sherwood Gambler.  She has a history of hyperlipidemia and hypertension.  Recently she was admitted for chest pain in May.  No chest pain since.  Mental status examination.   Patient is elderly woman who is casually dressed and fairly groomed.  She appears anxious but cooperative.  She maintained good eye contact.  Her speech is fast with normal tone and volume.  Her thought process is also logical.  There were no flight of ideas or loose association.  She denies any active or passive suicidal thoughts or homicidal thoughts.  She described her mood is better and her affect is improved from the past.  She denies any auditory or visual hallucination.  Her fund of knowledge is adequate.  There  were no psychotic symptoms present at this time.  Her attention and concentration is improved from the past.  She's alert and oriented x3.  Her insight judgment and impulse control is okay.  Assessment Axis I generalized anxiety disorder, Maj. depressive disorder recurrent, alcohol abuse by history Axis II deferred Axis III hypertension and hyperlipidemia Axis IV moderate Axis V 65-70  Plan I took her vitals.  I reviewed CC, tobacco/med/surg Hx, meds effects/ side  effects, problem list, therapies and responses as well as current situation/symptoms discussed options. See orders and pt instructions for more details.

## 2012-07-14 NOTE — Patient Instructions (Signed)
Use a photo of your horse as a wallpaper or screen saver on your computer  Be clear with your employer about what you need to get the job done.  Ask your PCP to consider switching you to [facility] for BP control. It helps anxiety in the periphery.   Take Neurontin three separate times a day;

## 2012-08-19 ENCOUNTER — Telehealth (HOSPITAL_COMMUNITY): Payer: Self-pay | Admitting: Psychiatry

## 2012-08-19 ENCOUNTER — Encounter (HOSPITAL_COMMUNITY): Payer: Self-pay | Admitting: Psychiatry

## 2012-08-19 ENCOUNTER — Ambulatory Visit (INDEPENDENT_AMBULATORY_CARE_PROVIDER_SITE_OTHER): Payer: Medicare HMO | Admitting: Psychiatry

## 2012-08-19 VITALS — Wt 111.6 lb

## 2012-08-19 DIAGNOSIS — F32A Depression, unspecified: Secondary | ICD-10-CM

## 2012-08-19 DIAGNOSIS — F339 Major depressive disorder, recurrent, unspecified: Secondary | ICD-10-CM

## 2012-08-19 DIAGNOSIS — F5105 Insomnia due to other mental disorder: Secondary | ICD-10-CM

## 2012-08-19 DIAGNOSIS — F419 Anxiety disorder, unspecified: Secondary | ICD-10-CM

## 2012-08-19 DIAGNOSIS — F1010 Alcohol abuse, uncomplicated: Secondary | ICD-10-CM

## 2012-08-19 DIAGNOSIS — F411 Generalized anxiety disorder: Secondary | ICD-10-CM

## 2012-08-19 DIAGNOSIS — F329 Major depressive disorder, single episode, unspecified: Secondary | ICD-10-CM

## 2012-08-19 MED ORDER — PROPRANOLOL HCL 10 MG PO TABS: 10.0000 mg | ORAL_TABLET | Freq: Four times a day (QID) | ORAL | Status: DC

## 2012-08-19 MED ORDER — CITALOPRAM HYDROBROMIDE 20 MG PO TABS: 30.0000 mg | ORAL_TABLET | Freq: Every day | ORAL | Status: DC

## 2012-08-19 MED ORDER — GABAPENTIN 100 MG PO CAPS: 100.0000 mg | ORAL_CAPSULE | Freq: Every day | ORAL | Status: DC

## 2012-08-19 NOTE — Telephone Encounter (Signed)
Phone message completed in the phone message section.  

## 2012-08-19 NOTE — Progress Notes (Signed)
[facility] [address] Progress Note Wendy Reynolds MRN: [id number] DOB: 03-May-1941 Age: 72 y.o.  Date: 08/19/2012 Start Time: 9:46 AM End Time: 10:05 AM  Chief Complaint: Chief Complaint  Patient presents with  . Depression  . Follow-up  . Medication Refill    Subjective: "My job is driving me totally crazy still.  It is burning me out.  I am using the book and am listening to the meditation some".  History of presenting illness Patient came for her followup appointment.  Pt reports that she is compliant with some of the psychotropic medications with good benefit and no noticeable side effects.  She has stopped the Neutrontin because it is not working and the Remeron because of dizziness.  Will start Inderal for her.  Discussed several options to change her life so the job won't kill her.  She thought that she could go back to hippo therapy and just spend time with her black horse.  Discussed using visualization and reminders for her to make her world change to the better.    Current psychiatric medication Celexa 30 mg daily  Past psychiatric history Patient has a long history of depression and anxiety.  She denies any previous history of suicidal attempt or any inpatient psychiatric treatment.  However she endorse one admission do to alcohol-related.  In the past she has taken numerous psychotropic medication.  She was seeing in this office since 2003 and taken Prozac, Effexor, amitriptyline, Cymbalta, trazodone, Celexa, Neurontin and Ambien.  Patient denies any history of psychosis paranoia or manic-like symptoms.  Family history Patient endorse multiple family member had psychiatric illness.  Her grandmother, cousins, mother has significant depression and had tried suicidal attempt.  Her daughter also tried suicidal attempt and currently living with the patient.  Psychosocial history Patient lives with her daughter who had recently had suicidal attempt.  Patient has one son  who lives in [place] however patient has limited contact with him.  Patient has been divorced.  Patient denies any history of physical sexual or verbal abuse.  Patient enjoys horse riding.  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  Alcohol and substance use history Patient admitted history of alcohol abuse in the past.  She has at least one admission due to alcohol-related.  She was drinking at least one drink every night.  However patient claimed to be sober since her last visit.    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  Medical history Patient see Dr. Sherwood Gambler.  She has a history of hyperlipidemia and hypertension.  Recently she was admitted for chest pain in May.  No chest pain since.  Mental status examination.   Patient is elderly woman who is casually dressed and fairly groomed.  She appears anxious but cooperative.  She maintained good eye contact.  Her speech is fast with normal tone and volume.  Her thought process is also logical.  There were no flight of ideas or loose association.  She denies any active or passive suicidal thoughts or homicidal thoughts.  She described her mood is better and her affect is improved from the past.  She denies any auditory or visual hallucination.  Her fund of knowledge is adequate.  There were no psychotic symptoms present at this time.  Her attention and concentration is improved from the past.  She's alert and oriented x3.  Her insight judgment and impulse control is okay.  Assessment Axis  I generalized anxiety disorder, Maj. depressive disorder recurrent, alcohol abuse by history Axis II deferred Axis III hypertension and hyperlipidemia Axis IV moderate Axis V 65-70  Plan/Discussion/Summary: I took her vitals.  I reviewed CC, tobacco/med/surg Hx, meds effects/ side effects, problem list, therapies and responses as well as current situation/symptoms discussed options. See  orders and pt instructions for more details.  Orson Aloe, MD, [facility]

## 2012-08-19 NOTE — Patient Instructions (Addendum)
Try using doses of the Neuorntin(any where form 1 to 4) or getting to sleep.  Yoga is a very helpful exercise method.  On TV or on line [employer]ai[employer] is a source of high quality information about yoga and videos on yoga.  Renee Ramus is the world's number one video yoga instructor according to some experts.  There are exceptional health benefits that can be achieved through yoga.  The main principles of yoga is acceptance, no competition, no comparison, and no judgement.  It is exceptional in helping people meditate and get to a very relaxed state.   Relaxation is the ultimate solution for you.  You can seek it through tub baths, bubble baths, essential oils or incense, walking or chatting with friends, listening to soft music, watching a candle burn and just letting all thoughts go and appreciating the true essence of the Creator.   Call if problems or concerns.

## 2012-08-25 ENCOUNTER — Telehealth (HOSPITAL_COMMUNITY): Payer: Self-pay | Admitting: Psychiatry

## 2012-08-25 DIAGNOSIS — F5105 Insomnia due to other mental disorder: Secondary | ICD-10-CM

## 2012-08-28 ENCOUNTER — Telehealth (HOSPITAL_COMMUNITY): Payer: Self-pay | Admitting: *Deleted

## 2012-08-28 NOTE — Telephone Encounter (Signed)
Phone call from pt forwarded to Dr.Arfeen in Dr.Walker's absence: "ext. [phone], work #. phone call from patient. was put on Inderal 10mg . 1 to 2 -four times a day.   she also takes Neurontin 100mg  take 1 to 4 at night.   she is not sleeping, waking up dizzy and nauseated."     Dr.Arfeen asked that pt be contacted with following instructions: May stop Inderal if desired.Follow up with Dr.Walker next week,. Patient contacted.She states she had adjusted dosage times of medicine and now feels much better. States sleeping better, anxiety less and has stopped shaking. She is taking medication as follows: Neurontin 100 mg 4 times daily with last dose at bedtime Inderal 10 mg takes 1 tablets 3 x day and takes 2 tablets at bedtime.

## 2012-09-01 ENCOUNTER — Telehealth (HOSPITAL_COMMUNITY): Payer: Self-pay | Admitting: Psychiatry

## 2012-09-01 MED ORDER — HYDROXYZINE PAMOATE 25 MG PO CAPS: 25.0000 mg | ORAL_CAPSULE | Freq: Every evening | ORAL | Status: DC | PRN

## 2012-09-01 NOTE — Telephone Encounter (Signed)
Pt described benefit from the Inderal and really appreciates the benefit for her anxiety.  The Neurontin is just not doing anything for her insomnia.  Will switch to Vistaril for that.

## 2012-09-02 ENCOUNTER — Telehealth (HOSPITAL_COMMUNITY): Payer: Self-pay | Admitting: Psychiatry

## 2012-09-03 ENCOUNTER — Telehealth (HOSPITAL_COMMUNITY): Payer: Self-pay | Admitting: Psychiatry

## 2012-09-03 DIAGNOSIS — F5105 Insomnia due to other mental disorder: Secondary | ICD-10-CM

## 2012-09-03 NOTE — Telephone Encounter (Signed)
Vistaril script had been reordered yesterday. This was a repeat call.

## 2012-09-03 NOTE — Telephone Encounter (Signed)
Phone message completed in the phone message section.  

## 2012-09-29 ENCOUNTER — Ambulatory Visit (HOSPITAL_COMMUNITY): Payer: Self-pay | Admitting: Psychiatry

## 2012-10-07 ENCOUNTER — Ambulatory Visit (HOSPITAL_COMMUNITY): Payer: Self-pay | Admitting: Psychiatry

## 2012-10-17 ENCOUNTER — Telehealth (HOSPITAL_COMMUNITY): Payer: Self-pay | Admitting: Psychiatry

## 2012-10-17 NOTE — Telephone Encounter (Signed)
Has appointment ion 3-26 wil refill meds then.

## 2012-10-22 ENCOUNTER — Ambulatory Visit (INDEPENDENT_AMBULATORY_CARE_PROVIDER_SITE_OTHER): Payer: Medicare HMO | Admitting: Psychiatry

## 2012-10-22 ENCOUNTER — Encounter (HOSPITAL_COMMUNITY): Payer: Self-pay | Admitting: Psychiatry

## 2012-10-22 VITALS — Wt 113.8 lb

## 2012-10-22 DIAGNOSIS — F419 Anxiety disorder, unspecified: Secondary | ICD-10-CM

## 2012-10-22 DIAGNOSIS — G250 Essential tremor: Secondary | ICD-10-CM | POA: Insufficient documentation

## 2012-10-22 DIAGNOSIS — F5105 Insomnia due to other mental disorder: Secondary | ICD-10-CM

## 2012-10-22 DIAGNOSIS — F329 Major depressive disorder, single episode, unspecified: Secondary | ICD-10-CM

## 2012-10-22 DIAGNOSIS — F411 Generalized anxiety disorder: Secondary | ICD-10-CM

## 2012-10-22 DIAGNOSIS — F339 Major depressive disorder, recurrent, unspecified: Secondary | ICD-10-CM

## 2012-10-22 DIAGNOSIS — F4010 Social phobia, unspecified: Secondary | ICD-10-CM

## 2012-10-22 MED ORDER — CITALOPRAM HYDROBROMIDE 20 MG PO TABS: 40.0000 mg | ORAL_TABLET | Freq: Every day | ORAL | Status: DC

## 2012-10-22 MED ORDER — HYDROXYZINE PAMOATE 25 MG PO CAPS: 25.0000 mg | ORAL_CAPSULE | Freq: Every evening | ORAL | Status: DC | PRN

## 2012-10-22 MED ORDER — PROPRANOLOL HCL ER 60 MG PO CP24: 60.0000 mg | ORAL_CAPSULE | Freq: Every day | ORAL | Status: DC

## 2012-10-22 NOTE — Patient Instructions (Signed)
"  I am Wishes [employer]" by Marylene Buerger and Lyndal Pulley may be helpful MUSIC for getting to sleep or for meditating You can order it from on line.  You might find the chill channel on Pandora and explore the artists that you like better.   At a small parts computer store like [employer] in [place] they will have Conservator, museum/gallery.  We have to claim our wellness.  Take care of yourself.  No one else is standing up to do the job and only you know what you need.   GET SERIOUS about taking care of yourself.  Do the next right thing and that often means doing something to care for yourself along the lines of are you hungry, are you angry, are you lonely, are you tired, are you scared?  [employer] is what that stands for.

## 2012-10-22 NOTE — Progress Notes (Signed)
[facility] [address] Progress Note Wendy Reynolds MRN: [id number] DOB: 1940/11/03 Age: 72 y.o.  Date: 10/22/2012 Start Time: 9:00 AM End Time: 9:25 AM  Chief Complaint: Chief Complaint  Patient presents with  . Depression  . Follow-up  . Medication Refill    Subjective: "My job is boring".  History of presenting illness Patient came for her followup appointment.  Pt reports that she is compliant with all of the psychotropic medications with good benefit and no noticeable side effects.  She is noting vast improvement in the shaking on the Inderal.  She is noting a slight amount of depression.  This could be due to the Inderal effect or could be from some of her frustrations with her life.  Discussed several approaches to her job and getting her meditation going better.   She is truly working hard at all these things.   Gave pt the statement on a stickie "I am good enough".  She noted that that is exactly what she needs to meditate on each morning.   Current psychiatric medication Celexa 30 mg daily Inderal 10 mg 1,1, and 2 at bedtime.  Past psychiatric history Patient has a long history of depression and anxiety.  She denies any previous history of suicidal attempt or any inpatient psychiatric treatment.  However she endorse one admission do to alcohol-related.  In the past she has taken numerous psychotropic medication.  She was seeing in this office since 2003 and taken Prozac, Effexor, amitriptyline, Cymbalta, trazodone, Celexa, Neurontin and Ambien.  Patient denies any history of psychosis paranoia or manic-like symptoms.  Family history Patient endorse multiple family member had psychiatric illness.  Her grandmother, cousins, mother has significant depression and had tried suicidal attempt.  Her daughter also tried suicidal attempt and currently living with the patient. family history includes Alcohol abuse in her cousin, father, maternal aunt, and mother and Depression in  her cousin, daughter, and maternal grandfather.  Psychosocial history Patient lives with her daughter who had recently had suicidal attempt.  Patient has one son who lives in [place] however patient has limited contact with him.  Patient has been divorced.  Patient denies any history of physical sexual or verbal abuse.  Patient enjoys horse riding.  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  Alcohol and substance use history Patient admitted history of alcohol abuse in the past.  She has at least one admission due to alcohol-related.  She was drinking at least one drink every night.  However patient claimed to be sober since her last visit.    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  Medical history Patient see Dr. Sherwood Gambler.  She has a history of hyperlipidemia and hypertension.  Recently she was admitted for chest pain in May.  No chest pain since.  Mental status examination.   Patient is elderly woman who is casually dressed and fairly groomed.  She appears anxious but cooperative.  She maintained good eye contact.  Her speech is fast with normal tone and volume.  Her thought process is also logical.  There were no flight of ideas or loose association.  She denies any active or passive suicidal thoughts or homicidal thoughts.  She described her mood is better and her affect is improved from the past.  She denies any auditory or visual hallucination.  Her fund of knowledge is adequate.  There were no psychotic symptoms present at this time.  Her attention and concentration is improved from the past.  She's alert and oriented x3.  Her insight judgment and impulse control is okay.  Lab Results:  Results for orders placed during the hospital encounter of 03/04/12 (from the past 8736 hour(s))  CBC WITH DIFFERENTIAL   Collection Time    03/04/12  9:51 AM      Result Value Range   WBC 4.6  4.0 - 10.5 K/uL   RBC 4.59  3.87 -  5.11 MIL/uL   Hemoglobin 14.4  12.0 - 15.0 g/dL   HCT 16.1  09.6 - 04.5 %   MCV 91.1  78.0 - 100.0 fL   MCH 31.4  26.0 - 34.0 pg   MCHC 34.4  30.0 - 36.0 g/dL   RDW 40.9  81.1 - 91.4 %   Platelets 228  150 - 400 K/uL   Neutrophils Relative 62  43 - 77 %   Neutro Abs 2.9  1.7 - 7.7 K/uL   Lymphocytes Relative 28  12 - 46 %   Lymphs Abs 1.3  0.7 - 4.0 K/uL   Monocytes Relative 9  3 - 12 %   Monocytes Absolute 0.4  0.1 - 1.0 K/uL   Eosinophils Relative 1  0 - 5 %   Eosinophils Absolute 0.0  0.0 - 0.7 K/uL   Basophils Relative 0  0 - 1 %   Basophils Absolute 0.0  0.0 - 0.1 K/uL  TROPONIN I   Collection Time    03/04/12  9:51 AM      Result Value Range   Troponin I <0.30  <0.30 ng/mL  BASIC METABOLIC PANEL   Collection Time    03/04/12  9:51 AM      Result Value Range   Sodium 139  135 - 145 mEq/L   Potassium 4.1  3.5 - 5.1 mEq/L   Chloride 100  96 - 112 mEq/L   CO2 28  19 - 32 mEq/L   Glucose, Bld 98  70 - 99 mg/dL   BUN 17  6 - 23 mg/dL   Creatinine, Ser 7.82  0.50 - 1.10 mg/dL   Calcium 9.7  8.4 - 95.6 mg/dL   GFR calc non Af Amer 71 (*) >90 mL/min   GFR calc Af Amer 82 (*) >90 mL/min  CARDIAC PANEL(CRET KIN+CKTOT+MB+TROPI)   Collection Time    03/04/12 11:29 AM      Result Value Range   Total CK 172  7 - 177 U/L   CK, MB 4.9 (*) 0.3 - 4.0 ng/mL   Troponin I <0.30  <0.30 ng/mL   Relative Index 2.8 (*) 0.0 - 2.5  HEPATIC FUNCTION PANEL   Collection Time    03/04/12 11:29 AM      Result Value Range   Total Protein 6.8  6.0 - 8.3 g/dL   Albumin 3.8  3.5 - 5.2 g/dL   AST 29  0 - 37 U/L   ALT 19  0 - 35 U/L   Alkaline Phosphatase 57  39 - 117 U/L   Total Bilirubin 0.4  0.3 - 1.2 mg/dL   Bilirubin, Direct <2.1  0.0 - 0.3 mg/dL   Indirect Bilirubin NOT CALCULATED  0.3 - 0.9 mg/dL  TSH   Collection Time    03/04/12 11:29 AM      Result Value Range   TSH 3.910  0.350 - 4.500 uIU/mL  T4, FREE   Collection Time    03/04/12 11:29 AM  Result Value Range   Free  T4 1.19  0.80 - 1.80 ng/dL  LIPASE, BLOOD   Collection Time    03/04/12 11:29 AM      Result Value Range   Lipase 32  11 - 59 U/L  PROTIME-INR   Collection Time    03/04/12 11:29 AM      Result Value Range   Prothrombin Time 12.9  11.6 - 15.2 seconds   INR 0.95  0.00 - 1.49  CARDIAC PANEL(CRET KIN+CKTOT+MB+TROPI)   Collection Time    03/04/12  5:26 PM      Result Value Range   Total CK 160  7 - 177 U/L   CK, MB 4.2 (*) 0.3 - 4.0 ng/mL   Troponin I <0.30  <0.30 ng/mL   Relative Index 2.6 (*) 0.0 - 2.5  CARDIAC PANEL(CRET KIN+CKTOT+MB+TROPI)   Collection Time    03/04/12 11:13 PM      Result Value Range   Total CK 122  7 - 177 U/L   CK, MB 3.3  0.3 - 4.0 ng/mL   Troponin I <0.30  <0.30 ng/mL   Relative Index 2.7 (*) 0.0 - 2.5  COMPREHENSIVE METABOLIC PANEL   Collection Time    03/05/12  5:19 AM      Result Value Range   Sodium 139  135 - 145 mEq/L   Potassium 4.1  3.5 - 5.1 mEq/L   Chloride 104  96 - 112 mEq/L   CO2 28  19 - 32 mEq/L   Glucose, Bld 94  70 - 99 mg/dL   BUN 15  6 - 23 mg/dL   Creatinine, Ser 0.98  0.50 - 1.10 mg/dL   Calcium 9.5  8.4 - 11.9 mg/dL   Total Protein 6.6  6.0 - 8.3 g/dL   Albumin 3.7  3.5 - 5.2 g/dL   AST 27  0 - 37 U/L   ALT 17  0 - 35 U/L   Alkaline Phosphatase 57  39 - 117 U/L   Total Bilirubin 0.6  0.3 - 1.2 mg/dL   GFR calc non Af Amer 63 (*) >90 mL/min   GFR calc Af Amer 73 (*) >90 mL/min  CBC   Collection Time    03/05/12  5:19 AM      Result Value Range   WBC 4.6  4.0 - 10.5 K/uL   RBC 4.43  3.87 - 5.11 MIL/uL   Hemoglobin 13.8  12.0 - 15.0 g/dL   HCT 14.7  82.9 - 56.2 %   MCV 91.9  78.0 - 100.0 fL   MCH 31.2  26.0 - 34.0 pg   MCHC 33.9  30.0 - 36.0 g/dL   RDW 13.0  86.5 - 78.4 %   Platelets 220  150 - 400 K/uL  LIPID PANEL   Collection Time    03/05/12  5:20 AM      Result Value Range   Cholesterol 235 (*) 0 - 200 mg/dL   Triglycerides 696  <295 mg/dL   HDL 284  >13 mg/dL   Total CHOL/HDL Ratio 2.1     VLDL 23  0 -  40 mg/dL   LDL Cholesterol 244 (*) 0 - 99 mg/dL  PCP draws routine labs and nothing is emerging as of concern.  Assessment Axis I generalized anxiety disorder, Maj. depressive disorder recurrent, alcohol abuse by history Axis II deferred Axis III hypertension and hyperlipidemia Axis IV moderate Axis V 65-70  Plan/Discussion: I took her vitals.  I  reviewed CC, tobacco/med/surg Hx, meds effects/ side effects, problem list, therapies and responses as well as current situation/symptoms discussed options. Continue current effective medications. See orders and pt instructions for more details.  Medical Decision Making Problem Points:  Established problem, stable/improving (1), Review of last therapy session (1) and Review of psycho-social stressors (1) Data Points:  Review or order clinical lab tests (1) Review of medication regiment & side effects (2)  I certify that outpatient services furnished can reasonably be expected to improve the patient's condition.   Orson Aloe, MD, [facility]

## 2012-12-11 ENCOUNTER — Telehealth (HOSPITAL_COMMUNITY): Payer: Self-pay | Admitting: Psychiatry

## 2012-12-11 DIAGNOSIS — F4010 Social phobia, unspecified: Secondary | ICD-10-CM

## 2012-12-11 MED ORDER — PROPRANOLOL HCL ER 60 MG PO CP24: 60.0000 mg | ORAL_CAPSULE | Freq: Every day | ORAL | Status: DC

## 2012-12-11 NOTE — Telephone Encounter (Signed)
Patient out of town without Inderal [place]. Will call in 4 tablets to pharmacy at [phone].

## 2012-12-23 ENCOUNTER — Ambulatory Visit (HOSPITAL_COMMUNITY): Payer: Self-pay | Admitting: Psychiatry

## 2012-12-30 ENCOUNTER — Encounter (HOSPITAL_COMMUNITY): Payer: Self-pay | Admitting: Psychiatry

## 2012-12-30 ENCOUNTER — Ambulatory Visit (INDEPENDENT_AMBULATORY_CARE_PROVIDER_SITE_OTHER): Payer: Medicare HMO | Admitting: Psychiatry

## 2012-12-30 VITALS — BP 125/70 | HR 55 | Ht 62.75 in | Wt 112.6 lb

## 2012-12-30 DIAGNOSIS — F339 Major depressive disorder, recurrent, unspecified: Secondary | ICD-10-CM

## 2012-12-30 DIAGNOSIS — F411 Generalized anxiety disorder: Secondary | ICD-10-CM

## 2012-12-30 DIAGNOSIS — F329 Major depressive disorder, single episode, unspecified: Secondary | ICD-10-CM

## 2012-12-30 DIAGNOSIS — F5105 Insomnia due to other mental disorder: Secondary | ICD-10-CM

## 2012-12-30 MED ORDER — PROPRANOLOL HCL 10 MG PO TABS: ORAL_TABLET | ORAL | Status: DC

## 2012-12-30 NOTE — Progress Notes (Signed)
[facility] [id number] Progress Note PAYTYN MESTA MRN: [id number] DOB: 04/02/1941 Age: 72 y.o.  Date: 12/30/2012 Start Time: 8:40 AM End Time: 9:05 AM  Chief Complaint: Chief Complaint  Patient presents with  . Depression  . Follow-up  . Medication Refill    Subjective: "My medical doctor thinks my tremors could be due to the Celexa.  I had had tremors before I started Celexa.  The Inderal helped the tremors the best.  He cut the Celexa and the tremors are no better.  He is sending me to a neurologist".  History of presenting illness Patient came for her followup appointment.  Pt reports that she is compliant with all of the psychotropic medications with good benefit and some side effects.  She is noting sedation on the increased dose of LA Inderal.  She asks to go back to the lower dose, will do.  Current psychiatric medication Celexa 20 mg daily Inderal LA 60 mg 2100  Past psychiatric history Patient has a long history of depression and anxiety.  She denies any previous history of suicidal attempt or any inpatient psychiatric treatment.  However she endorse one admission do to alcohol-related.  In the past she has taken numerous psychotropic medication.  She was seeing in this office since 2003 and taken Prozac, Effexor, amitriptyline, Cymbalta, trazodone, Celexa, Neurontin and Ambien.  Patient denies any history of psychosis paranoia or manic-like symptoms.  Allergies: Allergies  Allergen Reactions  . Erythromycin Nausea And Vomiting  . Penicillins Hives   Medical History: Past Medical History  Diagnosis Date  . Panic   . Hypertension   . Depression   . Generalized anxiety disorder 03/04/2012  . Alcohol use   . Diastolic dysfunction 03/05/2012    Grade 1. EF 60%  . Chest pain 03/04/2012    MI ruled out. Likely anxiety related.  . Hyperlipidemia 03/05/2012  Patient see Dr. Sherwood Gambler.  She has a history of hyperlipidemia and hypertension.  Recently she was admitted for  chest pain in May 2013.  No chest pain since.  Surgical History: History reviewed. No pertinent past surgical history. Family History: family history includes Alcohol abuse in her cousin, father, maternal aunt, and mother and Depression in her cousin, daughter, and maternal grandfather. Patient endorse multiple family member had psychiatric illness.  Her grandmother, cousins, mother has significant depression and had tried suicidal attempt.  Her daughter also tried suicidal attempt and currently living with the patient. Reviewed and nothing has changed.  Psychosocial history Patient lives with her daughter who had recently had suicidal attempt.  Patient has one son who lives in [place] however patient has limited contact with him.  Patient has been divorced.  Patient denies any history of physical sexual or verbal abuse.  Patient enjoys horse riding.  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  Alcohol and substance use history Patient admitted history of alcohol abuse in the past.  She has at least one admission due to alcohol-related.  She was drinking at least one drink every night.  However patient claimed to be sober since her last visit.    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  Mental status examination.   Patient is elderly woman who is casually dressed and fairly groomed.  She appears anxious but cooperative.  She maintained good eye contact.  Her speech is fast with normal tone and volume.  Her thought process is also logical.  There were no flight of ideas or loose association.  She denies any active or passive suicidal thoughts or homicidal thoughts.  She described her mood is better and her affect is improved from the past.  She denies any auditory or visual hallucination.  Her fund of knowledge is adequate.  There were no psychotic symptoms present at this time.  Her attention and concentration is improved  from the past.  She's alert and oriented x3.  Her insight judgment and impulse control is okay.  Lab Results:  Results for orders placed during the hospital encounter of 03/04/12 (from the past 8736 hour(s))  CBC WITH DIFFERENTIAL   Collection Time    03/04/12  9:51 AM      Result Value Range   WBC 4.6  4.0 - 10.5 K/uL   RBC 4.59  3.87 - 5.11 MIL/uL   Hemoglobin 14.4  12.0 - 15.0 g/dL   HCT 16.1  09.6 - 04.5 %   MCV 91.1  78.0 - 100.0 fL   MCH 31.4  26.0 - 34.0 pg   MCHC 34.4  30.0 - 36.0 g/dL   RDW 40.9  81.1 - 91.4 %   Platelets 228  150 - 400 K/uL   Neutrophils Relative % 62  43 - 77 %   Neutro Abs 2.9  1.7 - 7.7 K/uL   Lymphocytes Relative 28  12 - 46 %   Lymphs Abs 1.3  0.7 - 4.0 K/uL   Monocytes Relative 9  3 - 12 %   Monocytes Absolute 0.4  0.1 - 1.0 K/uL   Eosinophils Relative 1  0 - 5 %   Eosinophils Absolute 0.0  0.0 - 0.7 K/uL   Basophils Relative 0  0 - 1 %   Basophils Absolute 0.0  0.0 - 0.1 K/uL  TROPONIN I   Collection Time    03/04/12  9:51 AM      Result Value Range   Troponin I <0.30  <0.30 ng/mL  BASIC METABOLIC PANEL   Collection Time    03/04/12  9:51 AM      Result Value Range   Sodium 139  135 - 145 mEq/L   Potassium 4.1  3.5 - 5.1 mEq/L   Chloride 100  96 - 112 mEq/L   CO2 28  19 - 32 mEq/L   Glucose, Bld 98  70 - 99 mg/dL   BUN 17  6 - 23 mg/dL   Creatinine, Ser 7.82  0.50 - 1.10 mg/dL   Calcium 9.7  8.4 - 95.6 mg/dL   GFR calc non Af Amer 71 (*) >90 mL/min   GFR calc Af Amer 82 (*) >90 mL/min  CARDIAC PANEL(CRET KIN+CKTOT+MB+TROPI)   Collection Time    03/04/12 11:29 AM      Result Value Range   Total CK 172  7 - 177 U/L   CK, MB 4.9 (*) 0.3 - 4.0 ng/mL   Troponin I <0.30  <0.30 ng/mL   Relative Index 2.8 (*) 0.0 - 2.5  HEPATIC FUNCTION PANEL   Collection Time    03/04/12 11:29 AM      Result Value Range   Total Protein 6.8  6.0 - 8.3 g/dL   Albumin 3.8  3.5 - 5.2 g/dL   AST 29  0 - 37 U/L   ALT 19  0 - 35 U/L   Alkaline  Phosphatase 57  39 - 117 U/L   Total Bilirubin 0.4  0.3 - 1.2 mg/dL  Bilirubin, Direct <0.1  0.0 - 0.3 mg/dL   Indirect Bilirubin NOT CALCULATED  0.3 - 0.9 mg/dL  TSH   Collection Time    03/04/12 11:29 AM      Result Value Range   TSH 3.910  0.350 - 4.500 uIU/mL  T4, FREE   Collection Time    03/04/12 11:29 AM      Result Value Range   Free T4 1.19  0.80 - 1.80 ng/dL  LIPASE, BLOOD   Collection Time    03/04/12 11:29 AM      Result Value Range   Lipase 32  11 - 59 U/L  PROTIME-INR   Collection Time    03/04/12 11:29 AM      Result Value Range   Prothrombin Time 12.9  11.6 - 15.2 seconds   INR 0.95  0.00 - 1.49  CARDIAC PANEL(CRET KIN+CKTOT+MB+TROPI)   Collection Time    03/04/12  5:26 PM      Result Value Range   Total CK 160  7 - 177 U/L   CK, MB 4.2 (*) 0.3 - 4.0 ng/mL   Troponin I <0.30  <0.30 ng/mL   Relative Index 2.6 (*) 0.0 - 2.5  CARDIAC PANEL(CRET KIN+CKTOT+MB+TROPI)   Collection Time    03/04/12 11:13 PM      Result Value Range   Total CK 122  7 - 177 U/L   CK, MB 3.3  0.3 - 4.0 ng/mL   Troponin I <0.30  <0.30 ng/mL   Relative Index 2.7 (*) 0.0 - 2.5  COMPREHENSIVE METABOLIC PANEL   Collection Time    03/05/12  5:19 AM      Result Value Range   Sodium 139  135 - 145 mEq/L   Potassium 4.1  3.5 - 5.1 mEq/L   Chloride 104  96 - 112 mEq/L   CO2 28  19 - 32 mEq/L   Glucose, Bld 94  70 - 99 mg/dL   BUN 15  6 - 23 mg/dL   Creatinine, Ser 0.98  0.50 - 1.10 mg/dL   Calcium 9.5  8.4 - 11.9 mg/dL   Total Protein 6.6  6.0 - 8.3 g/dL   Albumin 3.7  3.5 - 5.2 g/dL   AST 27  0 - 37 U/L   ALT 17  0 - 35 U/L   Alkaline Phosphatase 57  39 - 117 U/L   Total Bilirubin 0.6  0.3 - 1.2 mg/dL   GFR calc non Af Amer 63 (*) >90 mL/min   GFR calc Af Amer 73 (*) >90 mL/min  CBC   Collection Time    03/05/12  5:19 AM      Result Value Range   WBC 4.6  4.0 - 10.5 K/uL   RBC 4.43  3.87 - 5.11 MIL/uL   Hemoglobin 13.8  12.0 - 15.0 g/dL   HCT 14.7  82.9 - 56.2 %   MCV  91.9  78.0 - 100.0 fL   MCH 31.2  26.0 - 34.0 pg   MCHC 33.9  30.0 - 36.0 g/dL   RDW 13.0  86.5 - 78.4 %   Platelets 220  150 - 400 K/uL  LIPID PANEL   Collection Time    03/05/12  5:20 AM      Result Value Range   Cholesterol 235 (*) 0 - 200 mg/dL   Triglycerides 696  <295 mg/dL   HDL 284  >13 mg/dL   Total CHOL/HDL Ratio 2.1     VLDL  23  0 - 40 mg/dL   LDL Cholesterol 161 (*) 0 - 99 mg/dL  PCP just drew a bunch of labs and nothing is emerging as of concern.  Assessment Axis I generalized anxiety disorder, Maj. depressive disorder recurrent, alcohol abuse by history Axis II deferred Axis III hypertension and hyperlipidemia Axis IV moderate Axis V 65-70  Plan/Discussion: I took her vitals.  I reviewed CC, tobacco/med/surg Hx, meds effects/ side effects, problem list, therapies and responses as well as current situation/symptoms discussed options. Try back on lower dose of Inderal and stay on lower dose of Celexa.  Let's see what the neurologist has to say. See orders and pt instructions for more details. MEDICATIONS this encounter: Meds ordered this encounter  Medications  . propranolol (INDERAL) 10 MG tablet    Sig: Take by mouth ONE in AM and afternoon and TWO at bedtime for anxiety    Dispense:  120 tablet    Refill:  1   Medical Decision Making Problem Points:  Established problem, stable/improving (1), Review of last therapy session (1) and Review of psycho-social stressors (1) Data Points:  Review or order clinical lab tests (1) Review of medication regiment & side effects (2)  I certify that outpatient services furnished can reasonably be expected to improve the patient's condition.   Orson Aloe, MD, [facility]

## 2012-12-30 NOTE — Patient Instructions (Signed)
Set a timer for 8 or a certain number minutes and walk for that amount of time in the house or in the yard.  Mark the number of minutes on a calendar for that day.  Do that every day this week.  Then next week increase the time by 1 minutes and then mark the calendar with the number of minutes for that day.  Each week increase your exercise by one minute.  Keep a record of this so you can see the progress you are making.  Do this every day, just like eating and sleeping.  It is good for pain control, depression, and for your soul/spirit.  Bring the record in for your next visit so we can talk about your effort and how you feel with the new exercise program going and working for you.  Relaxation is the ultimate solution for you.  You can seek it through tub baths, bubble baths, essential oils or incense, walking or chatting with friends, listening to soft music, watching a candle burn and just letting all thoughts go and appreciating the true essence of the Creator.  Pets or animals may be very helpful.  You might spend some time with them and then go do more directed meditation.  "I am Wishes Fulfilled Meditation" by Wayne W Dyer and James F Twyman may be helpful MUSIC for getting to sleep or for meditating You can order it from on line.  You might find the Chill channel on Pandora and explore the artists that you like better.   Take care of yourself.  No one else is standing up to do the job and only you know what you need.   GET SERIOUS about taking care of yourself.  Do the next right thing and that often means doing something to care for yourself along the lines of are you hungry, are you angry, are you lonely, are you tired, are you scared?  [employer] is what that stands for.  Call if problems or concerns. 

## 2013-01-26 ENCOUNTER — Other Ambulatory Visit (HOSPITAL_COMMUNITY): Payer: Self-pay | Admitting: Psychiatry

## 2013-01-26 DIAGNOSIS — F419 Anxiety disorder, unspecified: Secondary | ICD-10-CM

## 2013-01-26 DIAGNOSIS — F411 Generalized anxiety disorder: Secondary | ICD-10-CM

## 2013-01-26 MED ORDER — CITALOPRAM HYDROBROMIDE 20 MG PO TABS: 20.0000 mg | ORAL_TABLET | Freq: Every day | ORAL | Status: DC

## 2013-02-12 ENCOUNTER — Ambulatory Visit (INDEPENDENT_AMBULATORY_CARE_PROVIDER_SITE_OTHER): Payer: Medicare HMO | Admitting: Psychiatry

## 2013-02-12 ENCOUNTER — Encounter (HOSPITAL_COMMUNITY): Payer: Self-pay | Admitting: Psychiatry

## 2013-02-12 VITALS — Wt 112.0 lb

## 2013-02-12 DIAGNOSIS — F5105 Insomnia due to other mental disorder: Secondary | ICD-10-CM

## 2013-02-12 DIAGNOSIS — F489 Nonpsychotic mental disorder, unspecified: Secondary | ICD-10-CM

## 2013-02-12 MED ORDER — HYDROXYZINE PAMOATE 25 MG PO CAPS: 25.0000 mg | ORAL_CAPSULE | Freq: Every evening | ORAL | Status: DC | PRN

## 2013-02-12 NOTE — Progress Notes (Signed)
[facility] [id number] Progress Note Wendy Reynolds MRN: [id number] DOB: 24-Oct-1940 Age: 72 y.o.  Date: 02/12/2013  Chief Complaint  Patient presents with  . Follow-up  . Medication Problem   History of present illness. Patient is 72 year old Caucasian employed female who came for her followup appointment.  She's compliant with Celexa however she has concern because recently she is diagnosed with Parkinson.  She was given a new medication Aziliect by her neurologist who has drug drug interaction with antidepressant.  Patient is wondering if she can try a different antidepressant.  I called the pharmacy this is monoamine oxydase inhibitor and has drug drug interaction with almost every SSRI and antidepressant.  Patient is complaining of tremors or shakes.  She's taking Inderal is helping her.  She sleeping better.  She has some issues with her balance when she denies any active or passive suicidal thoughts or homicidal thoughts.  She doesn't crying spells.  She's working.  She's not drinking or using any illegal substance.  Current psychiatric medication Celexa 20 mg daily Inderal LA 60 mg 2100  Past psychiatric history Patient has a long history of depression and anxiety.  She denies any previous history of suicidal attempt or any inpatient psychiatric treatment.  However she endorse one admission do to alcohol-related.  In the past she has taken numerous psychotropic medication.  She was seeing in this office since 2003 and taken Prozac, Effexor, amitriptyline, Cymbalta, trazodone, Celexa, Neurontin and Ambien.  Patient denies any history of psychosis paranoia or manic-like symptoms.  Allergies: Allergies  Allergen Reactions  . Erythromycin Nausea And Vomiting  . Penicillins Hives   Medical History: Past Medical History  Diagnosis Date  . Panic   . Hypertension   . Depression   . Generalized anxiety disorder 03/04/2012  . Alcohol use   . Diastolic dysfunction 03/05/2012    Grade  1. EF 60%  . Chest pain 03/04/2012    MI ruled out. Likely anxiety related.  . Hyperlipidemia 03/05/2012  Patient see Dr. Sherwood Gambler.  She has a history of hyperlipidemia and hypertension.  Recently she was admitted for chest pain in May 2013.  No chest pain since.  Surgical History: No past surgical history on file. Family History: family history includes Alcohol abuse in her cousin, father, maternal aunt, and mother and Depression in her cousin, daughter, and maternal grandfather. Patient endorse multiple family member had psychiatric illness.  Her grandmother, cousins, mother has significant depression and had tried suicidal attempt.  Her daughter also tried suicidal attempt and currently living with the patient. Reviewed and nothing has changed.  Psychosocial history Patient lives with her daughter who had recently had suicidal attempt.  Patient has one son who lives in [place] however patient has limited contact with him.  Patient has been divorced.  Patient denies any history of physical sexual or verbal abuse.  Patient enjoys horse riding.  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  Alcohol and substance use history Patient admitted history of alcohol abuse in the past.  She has at least one admission due to alcohol-related.  She was drinking at least one drink every night.  However patient claimed to be sober since her last visit.    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  Mental status examination.   Patient is elderly woman who is casually dressed and fairly groomed.  She appears anxious but cooperative.  She maintained fair eye  contact.  Her speech is fast with normal tone and volume.  Her thought process is also logical.  There were no flight of ideas or loose association.  She denies any active or passive suicidal thoughts or homicidal thoughts.  She has some tremors or denies any muscle stiffness.  She described  her mood as anxious and her affect is mood appropriate.  She denies any auditory or visual hallucination.  Her fund of knowledge is adequate.  There were no psychotic symptoms present at this time.  Her attention and concentration is okay.  She's alert and oriented x3.  Her insight judgment and impulse control is okay.  Lab Results:  Results for orders placed during the hospital encounter of 03/04/12 (from the past 8736 hour(s))  CBC WITH DIFFERENTIAL   Collection Time    03/04/12  9:51 AM      Result Value Range   WBC 4.6  4.0 - 10.5 K/uL   RBC 4.59  3.87 - 5.11 MIL/uL   Hemoglobin 14.4  12.0 - 15.0 g/dL   HCT 16.1  09.6 - 04.5 %   MCV 91.1  78.0 - 100.0 fL   MCH 31.4  26.0 - 34.0 pg   MCHC 34.4  30.0 - 36.0 g/dL   RDW 40.9  81.1 - 91.4 %   Platelets 228  150 - 400 K/uL   Neutrophils Relative % 62  43 - 77 %   Neutro Abs 2.9  1.7 - 7.7 K/uL   Lymphocytes Relative 28  12 - 46 %   Lymphs Abs 1.3  0.7 - 4.0 K/uL   Monocytes Relative 9  3 - 12 %   Monocytes Absolute 0.4  0.1 - 1.0 K/uL   Eosinophils Relative 1  0 - 5 %   Eosinophils Absolute 0.0  0.0 - 0.7 K/uL   Basophils Relative 0  0 - 1 %   Basophils Absolute 0.0  0.0 - 0.1 K/uL  TROPONIN I   Collection Time    03/04/12  9:51 AM      Result Value Range   Troponin I <0.30  <0.30 ng/mL  BASIC METABOLIC PANEL   Collection Time    03/04/12  9:51 AM      Result Value Range   Sodium 139  135 - 145 mEq/L   Potassium 4.1  3.5 - 5.1 mEq/L   Chloride 100  96 - 112 mEq/L   CO2 28  19 - 32 mEq/L   Glucose, Bld 98  70 - 99 mg/dL   BUN 17  6 - 23 mg/dL   Creatinine, Ser 7.82  0.50 - 1.10 mg/dL   Calcium 9.7  8.4 - 95.6 mg/dL   GFR calc non Af Amer 71 (*) >90 mL/min   GFR calc Af Amer 82 (*) >90 mL/min  CARDIAC PANEL(CRET KIN+CKTOT+MB+TROPI)   Collection Time    03/04/12 11:29 AM      Result Value Range   Total CK 172  7 - 177 U/L   CK, MB 4.9 (*) 0.3 - 4.0 ng/mL   Troponin I <0.30  <0.30 ng/mL   Relative Index 2.8 (*) 0.0 -  2.5  HEPATIC FUNCTION PANEL   Collection Time    03/04/12 11:29 AM      Result Value Range   Total Protein 6.8  6.0 - 8.3 g/dL   Albumin 3.8  3.5 - 5.2 g/dL   AST 29  0 - 37 U/L   ALT 19  0 -  35 U/L   Alkaline Phosphatase 57  39 - 117 U/L   Total Bilirubin 0.4  0.3 - 1.2 mg/dL   Bilirubin, Direct <1.6  0.0 - 0.3 mg/dL   Indirect Bilirubin NOT CALCULATED  0.3 - 0.9 mg/dL  TSH   Collection Time    03/04/12 11:29 AM      Result Value Range   TSH 3.910  0.350 - 4.500 uIU/mL  T4, FREE   Collection Time    03/04/12 11:29 AM      Result Value Range   Free T4 1.19  0.80 - 1.80 ng/dL  LIPASE, BLOOD   Collection Time    03/04/12 11:29 AM      Result Value Range   Lipase 32  11 - 59 U/L  PROTIME-INR   Collection Time    03/04/12 11:29 AM      Result Value Range   Prothrombin Time 12.9  11.6 - 15.2 seconds   INR 0.95  0.00 - 1.49  CARDIAC PANEL(CRET KIN+CKTOT+MB+TROPI)   Collection Time    03/04/12  5:26 PM      Result Value Range   Total CK 160  7 - 177 U/L   CK, MB 4.2 (*) 0.3 - 4.0 ng/mL   Troponin I <0.30  <0.30 ng/mL   Relative Index 2.6 (*) 0.0 - 2.5  CARDIAC PANEL(CRET KIN+CKTOT+MB+TROPI)   Collection Time    03/04/12 11:13 PM      Result Value Range   Total CK 122  7 - 177 U/L   CK, MB 3.3  0.3 - 4.0 ng/mL   Troponin I <0.30  <0.30 ng/mL   Relative Index 2.7 (*) 0.0 - 2.5  COMPREHENSIVE METABOLIC PANEL   Collection Time    03/05/12  5:19 AM      Result Value Range   Sodium 139  135 - 145 mEq/L   Potassium 4.1  3.5 - 5.1 mEq/L   Chloride 104  96 - 112 mEq/L   CO2 28  19 - 32 mEq/L   Glucose, Bld 94  70 - 99 mg/dL   BUN 15  6 - 23 mg/dL   Creatinine, Ser 1.09  0.50 - 1.10 mg/dL   Calcium 9.5  8.4 - 60.4 mg/dL   Total Protein 6.6  6.0 - 8.3 g/dL   Albumin 3.7  3.5 - 5.2 g/dL   AST 27  0 - 37 U/L   ALT 17  0 - 35 U/L   Alkaline Phosphatase 57  39 - 117 U/L   Total Bilirubin 0.6  0.3 - 1.2 mg/dL   GFR calc non Af Amer 63 (*) >90 mL/min   GFR calc Af Amer 73  (*) >90 mL/min  CBC   Collection Time    03/05/12  5:19 AM      Result Value Range   WBC 4.6  4.0 - 10.5 K/uL   RBC 4.43  3.87 - 5.11 MIL/uL   Hemoglobin 13.8  12.0 - 15.0 g/dL   HCT 54.0  98.1 - 19.1 %   MCV 91.9  78.0 - 100.0 fL   MCH 31.2  26.0 - 34.0 pg   MCHC 33.9  30.0 - 36.0 g/dL   RDW 47.8  29.5 - 62.1 %   Platelets 220  150 - 400 K/uL  LIPID PANEL   Collection Time    03/05/12  5:20 AM      Result Value Range   Cholesterol 235 (*) 0 - 200 mg/dL  Triglycerides 116  <150 mg/dL   HDL 213  >08 mg/dL   Total CHOL/HDL Ratio 2.1     VLDL 23  0 - 40 mg/dL   LDL Cholesterol 657 (*) 0 - 99 mg/dL  PCP just drew a bunch of labs and nothing is emerging as of concern.  Assessment Axis I generalized anxiety disorder, Maj. depressive disorder recurrent, alcohol abuse by history Axis II deferred Axis III hypertension and hyperlipidemia Axis IV moderate Axis V 65-70  Plan/Discussion: I called pharmacy and the pharmacist could not provide a medication that does not have interaction with her new Parkinson medication.  I strongly recommended to continue Celexa and discuss with her neurologist to try Sinemet which has no interaction with any antidepressant.  Patient has not started her new medication and a consult neurologist for further recommendation.  She'll continue Celexa at Inderal LA present dose.  She's taking Vistaril sometime for sleep and anxiety.  Recommend to call us back if she is any question of conservatively worsening of the symptom.  Followup in 2 months.  MEDICATIONS this encounter: Meds ordered this encounter  Medications  . hydrOXYzine (VISTARIL) 25 MG capsule    Sig: Take 1 capsule (25 mg total) by mouth at bedtime as needed and may repeat dose one time if needed (insomnia).    Dispense:  60 capsule    Refill:  1   Medical Decision Making Problem Points:  Established problem, stable/improving (1), New problem, with no additional work-up planned (3), Review of  last therapy session (1) and Review of psycho-social stressors (1) Data Points:  Decision to obtain old records (1) Review or order clinical lab tests (1) Review and summation of old records (2) Review of medication regiment & side effects (2)  I certify that outpatient services furnished can reasonably be expected to improve the patient's condition.   Cleotis Nipper., MD, MSPH

## 2013-02-27 ENCOUNTER — Ambulatory Visit (HOSPITAL_COMMUNITY): Payer: Self-pay | Admitting: Psychiatry

## 2013-03-31 ENCOUNTER — Other Ambulatory Visit (HOSPITAL_COMMUNITY): Payer: Self-pay | Admitting: Psychiatry

## 2013-03-31 DIAGNOSIS — F411 Generalized anxiety disorder: Secondary | ICD-10-CM

## 2013-03-31 DIAGNOSIS — F419 Anxiety disorder, unspecified: Secondary | ICD-10-CM

## 2013-03-31 MED ORDER — CITALOPRAM HYDROBROMIDE 20 MG PO TABS: 20.0000 mg | ORAL_TABLET | Freq: Every day | ORAL | Status: DC

## 2013-04-06 ENCOUNTER — Telehealth (HOSPITAL_COMMUNITY): Payer: Self-pay | Admitting: Psychiatry

## 2013-04-07 ENCOUNTER — Other Ambulatory Visit (HOSPITAL_COMMUNITY): Payer: Self-pay | Admitting: Psychiatry

## 2013-04-07 DIAGNOSIS — F411 Generalized anxiety disorder: Secondary | ICD-10-CM

## 2013-04-07 MED ORDER — AMLODIPINE BESYLATE 5 MG PO TABS: 5.0000 mg | ORAL_TABLET | Freq: Every day | ORAL | Status: DC

## 2013-04-07 MED ORDER — PROPRANOLOL HCL 10 MG PO TABS: ORAL_TABLET | ORAL | Status: DC

## 2013-04-07 NOTE — Telephone Encounter (Signed)
Sent to pharmacy 

## 2013-05-15 ENCOUNTER — Encounter (HOSPITAL_COMMUNITY): Payer: Self-pay | Admitting: Psychiatry

## 2013-05-15 ENCOUNTER — Ambulatory Visit (HOSPITAL_COMMUNITY): Payer: Self-pay | Admitting: Psychiatry

## 2013-05-15 ENCOUNTER — Ambulatory Visit (INDEPENDENT_AMBULATORY_CARE_PROVIDER_SITE_OTHER): Payer: Medicare HMO | Admitting: Psychiatry

## 2013-05-15 VITALS — BP 110/80 | Ht 62.0 in | Wt 109.0 lb

## 2013-05-15 DIAGNOSIS — G20 Parkinson's disease: Secondary | ICD-10-CM

## 2013-05-15 DIAGNOSIS — F411 Generalized anxiety disorder: Secondary | ICD-10-CM

## 2013-05-15 DIAGNOSIS — G20A1 Parkinson's disease without dyskinesia, without mention of fluctuations: Secondary | ICD-10-CM

## 2013-05-15 DIAGNOSIS — F339 Major depressive disorder, recurrent, unspecified: Secondary | ICD-10-CM

## 2013-05-15 DIAGNOSIS — F419 Anxiety disorder, unspecified: Secondary | ICD-10-CM

## 2013-05-15 MED ORDER — ALPRAZOLAM 0.5 MG PO TABS: 0.5000 mg | ORAL_TABLET | Freq: Every day | ORAL | Status: DC

## 2013-05-15 MED ORDER — PROPRANOLOL HCL 10 MG PO TABS: ORAL_TABLET | ORAL | Status: DC

## 2013-05-15 MED ORDER — CITALOPRAM HYDROBROMIDE 20 MG PO TABS: 20.0000 mg | ORAL_TABLET | Freq: Every day | ORAL | Status: DC

## 2013-05-15 NOTE — Progress Notes (Signed)
Patient ID: Wendy Reynolds, female   DOB: March 27, 1941, 72 y.o.   MRN: [id number] [facility] [id number] Progress Note Wendy Reynolds MRN: [id number] DOB: [birth year] Age: 72 y.o.  Date: 05/15/2013  Chief Complaint  Patient presents with  . Anxiety  . Depression  . Follow-up   History of present illness. This patient is a 72 year old divorced white female who lives with her daughter in [place]. 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  The patient states that she's had depression and anxiety for years but primarily anxiety. She now has been diagnosed with Parkinson's disease and she shakes a lot. The propranolol has helped a lot with tremor. She's also on Symmetrel from her neurologist. Overall she functions very well. She owns a ride several horses. Her daughter has severe depression and tried to kill her self 12 years ago by jumping off a parking ramp and is now in a wheelchair. The daughter is very functional now. The patient states that her main complaint is difficulty with sleeping. She sleeps with a Vistaril for 3-4 hours and then wakes up. She was on a low dose of Xanax in the past and it worked better. I don't see any contraindication other than she drinks a glass or 2 of wine at night and this will need to be curtailed. She is in agreement.  Current psychiatric medication Celexa 20 mg daily Inderal LA 60 mg 2100 Vistaril 50 mg each bedtime Past psychiatric history Patient has a long history of depression and anxiety.  She denies any previous history of suicidal attempt or any inpatient psychiatric treatment.  However she endorse one admission do to alcohol-related.  In the past she has taken numerous psychotropic medication.  She was seeing in this office since 2003 and taken Prozac, Effexor, amitriptyline, Cymbalta, trazodone, Celexa, Neurontin and Ambien.  Patient denies any history of psychosis paranoia or manic-like symptoms.  Allergies: Allergies   Allergen Reactions  . Erythromycin Nausea And Vomiting  . Penicillins Hives   Medical History: Past Medical History  Diagnosis Date  . Panic   . Hypertension   . Depression   . Generalized anxiety disorder 03/04/2012  . Alcohol use   . Diastolic dysfunction 03/05/2012    Grade 1. EF 60%  . Chest pain 03/04/2012    MI ruled out. Likely anxiety related.  . Hyperlipidemia 03/05/2012  Patient see Dr. Sherwood Gambler.  She has a history of hyperlipidemia and hypertension.  Recently she was admitted for chest pain in May 2013.  No chest pain since.  Surgical History: History reviewed. No pertinent past surgical history. Family History: family history includes Alcohol abuse in her cousin, father, maternal aunt, and mother; Depression in her cousin, daughter, and maternal grandfather. Patient endorse multiple family member had psychiatric illness.  Her grandmother, cousins, mother has significant depression and had tried suicidal attempt.  Her daughter also tried suicidal attempt and currently living with the patient. Reviewed and nothing has changed.  Psychosocial history Patient lives with her daughter who had recently had suicidal attempt.  Patient has one son who lives in [place] however patient has limited contact with him.  Patient has been divorced.  Patient denies any history of physical sexual or verbal abuse.  Patient enjoys horse riding.  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  Alcohol and substance use history Patient admitted history of alcohol abuse in the past.  She has at least one admission due to alcohol-related.  She was drinking at  least one drink every night.  However patient claimed to be sober since her last visit.    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  Mental status examination.   Patient is elderly woman who is casually dressed and fairly groomed.  She appears anxious but cooperative.  She  maintained fair eye contact.  Her speech is fast with normal tone and volume.  Her thought process is also logical.  There were no flight of ideas or loose association.  She denies any active or passive suicidal thoughts or homicidal thoughts.  She has some tremors but denies denies any muscle stiffness.  She described her mood as anxious and her affect is mood appropriate.  She denies any auditory or visual hallucination.  Her fund of knowledge is adequate.  There were no psychotic symptoms present at this time.  Her attention and concentration is okay.  She's alert and oriented x3.  Her insight judgment and impulse control is okay.  Lab Results:  No results found for this or any previous visit (from the past 8736 hour(s)).PCP just drew a bunch of labs and nothing is emerging as of concern.  Assessment Axis I generalized anxiety disorder, Maj. depressive disorder recurrent, alcohol abuse by history Axis II deferred Axis III hypertension and hyperlipidemia Axis IV moderate Axis V 65-70  Plan/Discussion: The patient is still on Celexa but only at the 10 mg dose and she feels like she did slightly better at the 20 mg dose. It had been cut down because it was thought to cause more tremor but she didn't notice that it change when she cut it down. This does not helping her sleep and I don't see any problem with a very small dose of Xanax at bedtime. She'll continue the propranolol which is helped tremendously with her tremor. She'll return in 3 months  MEDICATIONS this encounter: Meds ordered this encounter  Medications  . citalopram (CELEXA) 20 MG tablet    Sig: Take 1 tablet (20 mg total) by mouth daily.    Dispense:  30 tablet    Refill:  2  . propranolol (INDERAL) 10 MG tablet    Sig: Take by mouth ONE in AM and afternoon and TWO at bedtime for anxiety    Dispense:  120 tablet    Refill:  2  . ALPRAZolam (XANAX) 0.5 MG tablet    Sig: Take 1 tablet (0.5 mg total) by mouth at bedtime.     Dispense:  30 tablet    Refill:  2  . amantadine (SYMMETREL) 100 MG capsule    Sig:    Medical Decision Making Problem Points:  Established problem, stable/improving (1), New problem, with no additional work-up planned (3), Review of last therapy session (1) and Review of psycho-social stressors (1) Data Points:  Decision to obtain old records (1) Review or order clinical lab tests (1) Review and summation of old records (2) Review of medication regiment & side effects (2)  I certify that outpatient services furnished can reasonably be expected to improve the patient's condition.   Diannia Ruder, MD

## 2013-05-31 ENCOUNTER — Other Ambulatory Visit (HOSPITAL_COMMUNITY): Payer: Self-pay | Admitting: Psychiatry

## 2013-08-14 ENCOUNTER — Ambulatory Visit (INDEPENDENT_AMBULATORY_CARE_PROVIDER_SITE_OTHER): Payer: Medicare HMO | Admitting: Psychiatry

## 2013-08-14 ENCOUNTER — Encounter (HOSPITAL_COMMUNITY): Payer: Self-pay | Admitting: Psychiatry

## 2013-08-14 ENCOUNTER — Telehealth (HOSPITAL_COMMUNITY): Payer: Self-pay

## 2013-08-14 VITALS — BP 130/82 | Ht 62.0 in | Wt 107.0 lb

## 2013-08-14 DIAGNOSIS — F419 Anxiety disorder, unspecified: Secondary | ICD-10-CM

## 2013-08-14 DIAGNOSIS — G20 Parkinson's disease: Secondary | ICD-10-CM

## 2013-08-14 DIAGNOSIS — F411 Generalized anxiety disorder: Secondary | ICD-10-CM

## 2013-08-14 DIAGNOSIS — F339 Major depressive disorder, recurrent, unspecified: Secondary | ICD-10-CM

## 2013-08-14 MED ORDER — CITALOPRAM HYDROBROMIDE 20 MG PO TABS: 20.0000 mg | ORAL_TABLET | Freq: Every day | ORAL | Status: DC

## 2013-08-14 MED ORDER — PROPRANOLOL HCL 10 MG PO TABS: 10.0000 mg | ORAL_TABLET | Freq: Every morning | ORAL | Status: DC

## 2013-08-14 MED ORDER — ALPRAZOLAM 0.5 MG PO TABS: ORAL_TABLET | ORAL | Status: DC

## 2013-08-14 NOTE — Telephone Encounter (Signed)
Clarified propranolol dosage

## 2013-08-14 NOTE — Progress Notes (Signed)
Patient ID: Wendy Reynolds, female   DOB: [birth year], 73 y.o.   MRN: [id number] Patient ID: Wendy Reynolds, female   DOB: May 13, 1941, 73 y.o.   MRN: [id number] [facility] [address] Progress Note Wendy Reynolds MRN: [id number] DOB: October 29, 1940 Age: 73 y.o.  Date: 08/14/2013  Chief Complaint  Patient presents with  . Anxiety  . Depression  . Follow-up   History of present illness. This patient is a 73 year old divorced white female who lives with her daughter in [place]. 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  The patient states that she's had depression and anxiety for years but primarily anxiety. She now has been diagnosed with Parkinson's disease and she shakes a lot. The propranolol has helped a lot with tremor. She's also on Symmetrel from her neurologist. Overall she functions very well. She owns  several horses. Her daughter has severe depression and tried to kill her self 12 years ago by jumping off a parking ramp and is now in a wheelchair. The daughter is very functional now. The patient states that her main complaint is difficulty with sleeping. She sleeps with a Vistaril for 3-4 hours and then wakes up. She was on a low dose of Xanax in the past and it worked better. I don't see any contraindication other than she drinks a glass or 2 of wine at night and this will need to be curtailed. She is in agreement  The patient returns after 3 months. In general her mood has been good. Her neurologist cut her down from Inderal 10 mg 3 times a day to just once a day because her balance was off. She has better control her balance on the lower dose of Inderal. She has had to work a lot. She still enjoys riding her horse. Her main complaint is still sleep the Xanax helps her sleep until about 2 in the morning and then she wakes up and can't get back to sleep. I told her we could try another Xanax if she wakes up at night. If this affects balance however she will need to cut  back again.  Current psychiatric medication Celexa 20 mg daily Inderal 10 mg qan Vistaril 50 mg each bedtime Past psychiatric history Patient has a long history of depression and anxiety.  She denies any previous history of suicidal attempt or any inpatient psychiatric treatment.  However she endorse one admission do to alcohol-related.  In the past she has taken numerous psychotropic medication.  She was seeing in this office since 2003 and taken Prozac, Effexor, amitriptyline, Cymbalta, trazodone, Celexa, Neurontin and Ambien.  Patient denies any history of psychosis paranoia or manic-like symptoms.  Allergies: Allergies  Allergen Reactions  . Erythromycin Nausea And Vomiting  . Penicillins Hives   Medical History: Past Medical History  Diagnosis Date  . Panic   . Hypertension   . Depression   . Generalized anxiety disorder 03/04/2012  . Alcohol use   . Diastolic dysfunction 03/05/2012    Grade 1. EF 60%  . Chest pain 03/04/2012    MI ruled out. Likely anxiety related.  . Hyperlipidemia 03/05/2012  Patient see Dr. Sherwood Gambler.  She has a history of hyperlipidemia and hypertension.  Recently she was admitted for chest pain in May 2013.  No chest pain since.  Surgical History: History reviewed. No pertinent past surgical history. Family History: family history includes Alcohol abuse in her cousin, father, maternal aunt, and mother; Depression in her cousin, daughter, and  maternal grandfather. Patient endorse multiple family member had psychiatric illness.  Her grandmother, cousins, mother has significant depression and had tried suicidal attempt.  Her daughter also tried suicidal attempt and currently living with the patient. Reviewed and nothing has changed.  Psychosocial history Patient lives with her daughter who had recently had suicidal attempt.  Patient has one son who lives in [place] however patient has limited contact with him.  Patient has been divorced.  Patient denies any history  of physical sexual or verbal abuse.  Patient enjoys horse riding.  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  Alcohol and substance use history Patient admitted history of alcohol abuse in the past.  She has at least one admission due to alcohol-related.  She was drinking at least one drink every night.  However patient claimed to be sober since her last visit.    Education and work history Patient has a Naval architectcollege education.  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  Mental status examination.   Patient is elderly woman who is casually dressed and fairly groomed.  She appears anxious but cooperative.  She maintained fair eye contact.  Her speech is fast with normal tone and volume.  Her thought process is also logical.  There were no flight of ideas or loose association.  She denies any active or passive suicidal thoughts or homicidal thoughts.  She has some tremors but denies denies any muscle stiffness.  She described her mood as anxious and her affect is mood appropriate.  She denies any auditory or visual hallucination.  Her fund of knowledge is adequate.  There were no psychotic symptoms present at this time.  Her attention and concentration is okay.  She's alert and oriented x3.  Her insight judgment and impulse control is okay.  Lab Results:  No results found for this or any previous visit (from the past 8736 hour(s)).PCP just drew a bunch of labs and nothing is emerging as of concern.  Assessment Axis I generalized anxiety disorder, Maj. depressive disorder recurrent, alcohol abuse by history Axis II deferred Axis III hypertension and hyperlipidemia, Parkinson's disease Axis IV moderate Axis V 65-70  Plan/Discussion: The patient has benefited from Celexa to higher dose. Her depression is improved. Her anxiety is fairly well controlled even on the lower dose of Inderal. Because she is having difficulty sleeping we can increase Xanax to 0.5 mg at bedtime with an additional dose  if she wakes up.. She'll return in 3 months  MEDICATIONS this encounter: Meds ordered this encounter  Medications  . propranolol (INDERAL) 10 MG tablet    Sig: Take 1 tablet (10 mg total) by mouth every morning.    Dispense:  30 tablet    Refill:  3  . citalopram (CELEXA) 20 MG tablet    Sig: Take 1 tablet (20 mg total) by mouth daily.    Dispense:  30 tablet    Refill:  2  . ALPRAZolam (XANAX) 0.5 MG tablet    Sig: Take two at bedtime    Dispense:  60 tablet    Refill:  2   Medical Decision Making Problem Points:  Established problem, stable/improving (1), New problem, with no additional work-up planned (3), Review of last therapy session (1) and Review of psycho-social stressors (1) Data Points:  Decision to obtain old records (1) Review or order clinical lab tests (1) Review and summation of old records (2) Review of medication regiment & side effects (2)  I certify that outpatient  services furnished can reasonably be expected to improve the patient's condition.   Diannia Ruder, MD

## 2013-11-13 ENCOUNTER — Ambulatory Visit (HOSPITAL_COMMUNITY): Payer: Self-pay | Admitting: Psychiatry

## 2013-12-23 ENCOUNTER — Ambulatory Visit (HOSPITAL_COMMUNITY): Payer: Self-pay | Admitting: Psychiatry

## 2013-12-23 ENCOUNTER — Encounter (HOSPITAL_COMMUNITY): Payer: Self-pay | Admitting: Psychiatry

## 2013-12-23 ENCOUNTER — Ambulatory Visit (INDEPENDENT_AMBULATORY_CARE_PROVIDER_SITE_OTHER): Payer: Medicare HMO | Admitting: Psychiatry

## 2013-12-23 VITALS — BP 120/80 | Ht 62.0 in | Wt 104.0 lb

## 2013-12-23 DIAGNOSIS — F411 Generalized anxiety disorder: Secondary | ICD-10-CM

## 2013-12-23 DIAGNOSIS — F339 Major depressive disorder, recurrent, unspecified: Secondary | ICD-10-CM

## 2013-12-23 DIAGNOSIS — G20 Parkinson's disease: Secondary | ICD-10-CM

## 2013-12-23 DIAGNOSIS — F419 Anxiety disorder, unspecified: Secondary | ICD-10-CM

## 2013-12-23 MED ORDER — PROPRANOLOL HCL 10 MG PO TABS: 10.0000 mg | ORAL_TABLET | Freq: Every morning | ORAL | Status: DC

## 2013-12-23 MED ORDER — ALPRAZOLAM 0.5 MG PO TABS: ORAL_TABLET | ORAL | Status: DC

## 2013-12-23 MED ORDER — CITALOPRAM HYDROBROMIDE 20 MG PO TABS: 20.0000 mg | ORAL_TABLET | Freq: Every day | ORAL | Status: DC

## 2013-12-23 NOTE — Progress Notes (Signed)
Patient ID: Wendy Reynolds, female   DOB: 12-Apr-1941, 73 y.o.   MRN: [id number] Patient ID: Wendy Reynolds, female   DOB: 03-15-1941, 73 y.o.   MRN: [id number] Patient ID: Wendy Reynolds, female   DOB: 1941-06-21, 73 y.o.   MRN: [id number] [facility] [id number] Progress Note Wendy Reynolds MRN: [id number] DOB: 16-Feb-1941 Age: 73 y.o.  Date: 12/23/2013  Chief Complaint  Patient presents with  . Anxiety  . Depression  . Follow-up   History of present illness. This patient is a 73 year old divorced white female who lives with her daughter in [place]. 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  The patient states that she's had depression and anxiety for years but primarily anxiety. She now has been diagnosed with Parkinson's disease and she shakes a lot. The propranolol has helped a lot with tremor. She's also on Symmetrel from her neurologist. Overall she functions very well. She owns  several horses. Her daughter has severe depression and tried to kill her self 12 years ago by jumping off a parking ramp and is now in a wheelchair. The daughter is very functional now. The patient states that her main complaint is difficulty with sleeping. She sleeps with a Vistaril for 3-4 hours and then wakes up. She was on a low dose of Xanax in the past and it worked better. I don't see any contraindication other than she drinks a glass or 2 of wine at night and this will need to be curtailed. She is in agreement  The patient returns after 5 months. She's very stressed from her job. She works long hours to keep up with that documentation in the nursing home. 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 She's very stressed at work and doesn't eat lunch and she has lost about 8 pounds in the past year. I've encouraged her to eat more and she agrees. She's sleeping well and her mood is generally good. She still very much enjoys riding her horses  Current  psychiatric medication Celexa 20 mg daily Inderal 10 mg qan Vistaril 50 mg each bedtime Past psychiatric history Patient has a long history of depression and anxiety.  She denies any previous history of suicidal attempt or any inpatient psychiatric treatment.  However she endorse one admission do to alcohol-related.  In the past she has taken numerous psychotropic medication.  She was seeing in this office since 2003 and taken Prozac, Effexor, amitriptyline, Cymbalta, trazodone, Celexa, Neurontin and Ambien.  Patient denies any history of psychosis paranoia or manic-like symptoms.  Allergies: Allergies  Allergen Reactions  . Erythromycin Nausea And Vomiting  . Penicillins Hives   Medical History: Past Medical History  Diagnosis Date  . Panic   . Hypertension   . Depression   . Generalized anxiety disorder 03/04/2012  . Alcohol use   . Diastolic dysfunction 03/05/2012    Grade 1. EF 60%  . Chest pain 03/04/2012    MI ruled out. Likely anxiety related.  . Hyperlipidemia 03/05/2012  Patient see Dr. Sherwood Gambler.  She has a history of hyperlipidemia and hypertension.  Recently she was admitted for chest pain in May 2013.  No chest pain since.  Surgical History: History reviewed. No pertinent past surgical history. Family History: family history includes Alcohol abuse in her cousin, father, maternal aunt, and mother; Depression in her cousin, daughter, and maternal grandfather. Patient endorse multiple family member had psychiatric illness.  Her grandmother, cousins,  mother has significant depression and had tried suicidal attempt.  Her daughter also tried suicidal attempt and currently living with the patient. Reviewed and nothing has changed.  Psychosocial history Patient lives with her daughter who had recently had suicidal attempt.  Patient has one son who lives in [place] however patient has limited contact with him.  Patient has been divorced.  Patient denies any history of physical sexual or  verbal abuse.  Patient enjoys horse riding.  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  Alcohol and substance use history Patient admitted history of alcohol abuse in the past.  She has at least one admission due to alcohol-related.  She was drinking at least one drink every night.  However patient claimed to be sober since her last visit.    Education and work history Patient has a [employer] architectcollege education.  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  Mental status examination.   Patient is elderly woman who is casually dressed and fairly groomed.  She appears anxious but cooperative.  She maintained fair eye contact.  Her speech is fast with normal tone and volume.  Her thought process is also logical.  There were no flight of ideas or loose association.  She denies any active or passive suicidal thoughts or homicidal thoughts.  She has minimal tremors but denies denies any muscle stiffness.  She described her mood as anxious and her affect is mood appropriate.  She denies any auditory or visual hallucination.  Her fund of knowledge is adequate.  There were no psychotic symptoms present at this time.  Her attention and concentration is okay.  She's alert and oriented x3.  Her insight judgment and impulse control is okay.  Lab Results:  No results found for this or any previous visit (from the past 8736 hour(s)).PCP just drew a bunch of labs and nothing is emerging as of concern.  Assessment Axis I generalized anxiety disorder, Maj. depressive disorder recurrent, alcohol abuse by history Axis II deferred Axis III hypertension and hyperlipidemia, Parkinson's disease Axis IV moderate Axis V 65-70  Plan/Discussion: The patient has benefited from Celexa to higher dose. Her depression is improved. Her anxiety is fairly well controlled even on the lower dose of Inderal. She can continue Xanax to 0.5 mg at bedtime with an additional dose if she wakes up.. She'll return in 4 months  MEDICATIONS  this encounter: Meds ordered this encounter  Medications  . propranolol (INDERAL) 10 MG tablet    Sig: Take 1 tablet (10 mg total) by mouth every morning.    Dispense:  30 tablet    Refill:  3  . citalopram (CELEXA) 20 MG tablet    Sig: Take 1 tablet (20 mg total) by mouth daily.    Dispense:  30 tablet    Refill:  2  . ALPRAZolam (XANAX) 0.5 MG tablet    Sig: Take two at bedtime    Dispense:  60 tablet    Refill:  2   Medical Decision Making Problem Points:  Established problem, stable/improving (1), New problem, with no additional work-up planned (3), Review of last therapy session (1) and Review of psycho-social stressors (1) Data Points:  Decision to obtain old records (1) Review or order clinical lab tests (1) Review and summation of old records (2) Review of medication regiment & side effects (2)  I certify that outpatient services furnished can reasonably be expected to improve the patient's condition.   Wendy Rudereborah Jerricka Carvey, MD

## 2014-04-26 ENCOUNTER — Ambulatory Visit (HOSPITAL_COMMUNITY): Payer: Self-pay | Admitting: Psychiatry

## 2014-04-26 ENCOUNTER — Encounter (HOSPITAL_COMMUNITY): Payer: Self-pay | Admitting: Psychiatry

## 2014-04-29 ENCOUNTER — Encounter (HOSPITAL_COMMUNITY): Payer: Self-pay | Admitting: Emergency Medicine

## 2014-04-29 ENCOUNTER — Emergency Department (HOSPITAL_COMMUNITY)
Admission: EM | Admit: 2014-04-29 | Discharge: 2014-04-29 | Disposition: A | Discharge status: Home / Self Care | Payer: Medicare HMO | Attending: Emergency Medicine | Admitting: Emergency Medicine

## 2014-04-29 DIAGNOSIS — I10 Essential (primary) hypertension: Secondary | ICD-10-CM | POA: Insufficient documentation

## 2014-04-29 DIAGNOSIS — S0500XA Injury of conjunctiva and corneal abrasion without foreign body, unspecified eye, initial encounter: Secondary | ICD-10-CM | POA: Diagnosis not present

## 2014-04-29 DIAGNOSIS — Z7982 Long term (current) use of aspirin: Secondary | ICD-10-CM | POA: Insufficient documentation

## 2014-04-29 DIAGNOSIS — Z880 Allergy status to penicillin: Secondary | ICD-10-CM | POA: Insufficient documentation

## 2014-04-29 DIAGNOSIS — Y9389 Activity, other specified: Secondary | ICD-10-CM | POA: Diagnosis not present

## 2014-04-29 DIAGNOSIS — F329 Major depressive disorder, single episode, unspecified: Secondary | ICD-10-CM | POA: Diagnosis not present

## 2014-04-29 DIAGNOSIS — Z79899 Other long term (current) drug therapy: Secondary | ICD-10-CM | POA: Insufficient documentation

## 2014-04-29 DIAGNOSIS — X58XXXA Exposure to other specified factors, initial encounter: Secondary | ICD-10-CM | POA: Insufficient documentation

## 2014-04-29 DIAGNOSIS — E785 Hyperlipidemia, unspecified: Secondary | ICD-10-CM | POA: Insufficient documentation

## 2014-04-29 DIAGNOSIS — Y9289 Other specified places as the place of occurrence of the external cause: Secondary | ICD-10-CM | POA: Diagnosis not present

## 2014-04-29 DIAGNOSIS — F410 Panic disorder [episodic paroxysmal anxiety] without agoraphobia: Secondary | ICD-10-CM | POA: Diagnosis not present

## 2014-04-29 DIAGNOSIS — Y990 Civilian activity done for income or pay: Secondary | ICD-10-CM | POA: Diagnosis not present

## 2014-04-29 DIAGNOSIS — S0590XA Unspecified injury of unspecified eye and orbit, initial encounter: Secondary | ICD-10-CM | POA: Diagnosis present

## 2014-04-29 MED ORDER — TETRACAINE HCL 0.5 % OP SOLN
OPHTHALMIC | Status: AC
  Administered 2014-04-29: 18:00:00
  Filled 2014-04-29: qty 2

## 2014-04-29 MED ORDER — CIPROFLOXACIN HCL 0.3 % OP SOLN
OPHTHALMIC | Status: AC
  Filled 2014-04-29: qty 2.5

## 2014-04-29 MED ORDER — CIPROFLOXACIN HCL 0.3 % OP SOLN
1.0000 [drp] | Freq: Once | OPHTHALMIC | Status: AC
  Administered 2014-04-29: 1 [drp] via OPHTHALMIC
  Filled 2014-04-29: qty 2.5

## 2014-04-29 MED ORDER — TETRACAINE HCL 0.5 % OP SOLN
1.0000 [drp] | Freq: Once | OPHTHALMIC | Status: AC
  Administered 2014-04-29: 1 [drp] via OPHTHALMIC

## 2014-04-29 MED ORDER — KETOROLAC TROMETHAMINE 0.5 % OP SOLN
1.0000 [drp] | Freq: Once | OPHTHALMIC | Status: AC
  Administered 2014-04-29: 1 [drp] via OPHTHALMIC
  Filled 2014-04-29: qty 5

## 2014-04-29 NOTE — ED Notes (Signed)
bil eye discomfort.   Os  20/70  OD  20/50  OU  20/40    With glasses. Says eyes are  Dry

## 2014-04-29 NOTE — ED Notes (Signed)
[facility] to test for glaucoma. Pt reports redness and burning to bilateral eyes.

## 2014-04-29 NOTE — ED Provider Notes (Signed)
Patient has a history of dry eyes followed by a optometrist. She's been taking over-the-counter Sustiva. She states today she woke up and she was having discomfort in one of her eyes and the like and eyelashes her something in her eye. It then developed into her other eye. She still has a feeling of a foreign body sensation. She denies contact use. She denies being around sun lamps or UV lamps or welding. She has never had a problem like this before.  Patient has bilateral scleral injection the left is worse than the right. On slit-lamp exam she has some diffuse punctate uptake mainly in the center of the cornea bilaterally.  Medical screening examination/treatment/procedure(s) were conducted as a shared visit with non-physician practitioner(s) and myself.  I personally evaluated the patient during the encounter.   EKG Interpretation None       Devoria AlbeIva Amol Domanski, MD, Armando GangFACEP   Ward GivensIva L Zuleika Gallus, MD 04/29/14 [phone]

## 2014-04-29 NOTE — ED Provider Notes (Signed)
CSN: [id number]     Arrival date & time 04/29/14  1714 History   First MD Initiated Contact with Patient 04/29/14 1737     Chief Complaint  Patient presents with  . Eye Pain     (Consider location/radiation/quality/duration/timing/severity/associated sxs/prior Treatment) The history is provided by the patient.   Wendy Reynolds is a 73 y.o. female presenting with a 1 day history of bilateral eye burning pain along with photophobia.  The symptoms started yesterday while at work when she felt possibly a foreign body (an eyelash?) get in her eye.  She has flushed the eye without relief of pain, and now today the right eye also has similar discomfort. She denies drainage from either eye other than increased tearing.  She reports chronic dry eye for which she used symmetrel, but has been worsened the past month.  She has found it harder to read the smaller numbers on her computer since the symptoms started, otherwise her visual acuity is ok.  Her tetanus is utd.     Past Medical History  Diagnosis Date  . Panic   . Hypertension   . Depression   . Generalized anxiety disorder 03/04/2012  . Alcohol use   . Diastolic dysfunction 03/05/2012    Grade 1. EF 60%  . Chest pain 03/04/2012    MI ruled out. Likely anxiety related.  . Hyperlipidemia 03/05/2012   History reviewed. No pertinent past surgical history. Family History  Problem Relation Age of Onset  . Depression Maternal Grandfather   . Depression Cousin   . Alcohol abuse Cousin   . Depression Daughter   . Alcohol abuse Mother   . Alcohol abuse Father   . Alcohol abuse Maternal Aunt    History  Substance Use Topics  . Smoking status: Never Smoker   . Smokeless tobacco: Never Used  . Alcohol Use: No     Comment: occ   OB History   Grav Para Term Preterm Abortions TAB SAB Ect Mult Living                 Review of Systems  Constitutional: Negative for fever.  HENT: Negative for congestion and sore throat.   Eyes: Positive for  photophobia, pain and redness.  Respiratory: Negative for chest tightness and shortness of breath.   Cardiovascular: Negative for chest pain.  Gastrointestinal: Negative for nausea and abdominal pain.  Genitourinary: Negative.   Musculoskeletal: Negative for arthralgias, joint swelling and neck pain.  Skin: Negative.  Negative for rash and wound.  Neurological: Negative for dizziness, weakness, light-headedness, numbness and headaches.  Psychiatric/Behavioral: Negative.       Allergies  Erythromycin and Penicillins  Home Medications   Prior to Admission medications   Medication Sig Start Date End Date Taking? Authorizing Provider  ALPRAZolam Prudy Feeler) 0.5 MG tablet Take two at bedtime 12/23/13   Diannia Ruder, MD  amantadine (SYMMETREL) 100 MG capsule  04/29/13   Historical Provider, MD  amLODipine (NORVASC) 5 MG tablet Take 1 tablet (5 mg total) by mouth daily. 04/07/13   Diannia Ruder, MD  aspirin 81 MG chewable tablet Chew 81 mg by mouth daily.    Historical Provider, MD  atorvastatin (LIPITOR) 10 MG tablet Take 0.5 tablets (5 mg total) by mouth daily. 03/05/12 03/05/13  Elliot Cousin, MD  citalopram (CELEXA) 20 MG tablet Take 1 tablet (20 mg total) by mouth daily. 12/23/13   Diannia Ruder, MD  fish oil-omega-3 fatty acids 1000 MG capsule Take 1 g by mouth  daily.    Historical Provider, MD  Multiple Vitamin (MULTIVITAMIN WITH MINERALS) TABS Take 1 tablet by mouth daily.    Historical Provider, MD  omeprazole (PRILOSEC) 20 MG capsule Take 1 capsule (20 mg total) by mouth daily. 03/05/12 03/05/13  Elliot Cousinenise Fisher, MD  Polyethyl Glycol-Propyl Glycol (SYSTANE OP) Place 1 drop into both eyes 2 (two) times daily.    Historical Provider, MD  propranolol (INDERAL) 10 MG tablet Take 1 tablet (10 mg total) by mouth every morning. 12/23/13   Diannia Rudereborah Ross, MD   BP 129/76  Pulse 67  Temp(Src) 97.9 F (36.6 C) (Oral)  Resp 20  Ht 5\' 2"  (1.575 m)  Wt 102 lb (46.267 kg)  BMI 18.65 kg/m2  SpO2 100% Physical  Exam  Constitutional: She is oriented to person, place, and time. She appears well-developed and well-nourished.  HENT:  Head: Normocephalic and atraumatic.  Right Ear: Tympanic membrane and ear canal normal.  Left Ear: Tympanic membrane and ear canal normal.  Nose: No mucosal edema or rhinorrhea.  Mouth/Throat: Uvula is midline, oropharynx is clear and moist and mucous membranes are normal. No oropharyngeal exudate, posterior oropharyngeal edema, posterior oropharyngeal erythema or tonsillar abscesses.  Eyes: EOM are normal. Pupils are equal, round, and reactive to light. Lids are everted and swept, no foreign bodies found. Right conjunctiva is injected. Left conjunctiva is injected.  Slit lamp exam:      The right eye shows fluorescein uptake.       The left eye shows fluorescein uptake.  bil eye discomfort.   Os  20/70  OD  20/50  OU  20/40   Right ocular pressure 8 mm/hg,  Left 6 mm/hg.  Punctate dye uptake, left greater than right,  Mid corneas bilaterally.  Cardiovascular: Normal rate and normal heart sounds.   Pulmonary/Chest: Effort normal. No respiratory distress. She has no wheezes. She has no rales.  Abdominal: Soft. There is no tenderness.  Musculoskeletal: Normal range of motion.  Neurological: She is alert and oriented to person, place, and time.  Skin: Skin is warm and dry. No rash noted.  Psychiatric: She has a normal mood and affect.    ED Course  Procedures (including critical care time) Labs Review Labs Reviewed - No data to display  Imaging Review No results found.   EKG Interpretation None      MDM   Final diagnoses:  Corneal abrasion, unspecified laterality, initial encounter    Patient was also seen by Dr. Lynelle DoctorKnapp.  She was given 2 eyedrops including ketorolac and Cipro.  Referral to Dr. Bing PlumeHaynes she will call in the morning for an appointment time tomorrow.  Suspect chronic dry eye may have caused the corneal irritation.  No foreign body  seen.    Burgess AmorJulie Maninder Deboer, PA-C 04/29/14 2001

## 2014-04-29 NOTE — Discharge Instructions (Signed)
Corneal Abrasion The cornea is the clear covering at the front and center of the eye. When looking at the colored portion of the eye (iris), you are looking through the cornea. This very thin tissue is made up of many layers. The surface layer is a single layer of cells (corneal epithelium) and is one of the most sensitive tissues in the body. If a scratch or injury causes the corneal epithelium to come off, it is called a corneal abrasion. If the injury extends to the tissues below the epithelium, the condition is called a corneal ulcer. CAUSES   Scratches.  Trauma.  Foreign body in the eye. Some people have recurrences of abrasions in the area of the original injury even after it has healed (recurrent erosion syndrome). Recurrent erosion syndrome generally improves and goes away with time. SYMPTOMS   Eye pain.  Difficulty or inability to keep the injured eye open.  The eye becomes very sensitive to light.  Recurrent erosions tend to happen suddenly, first thing in the morning, usually after waking up and opening the eye. DIAGNOSIS  Your health care provider can diagnose a corneal abrasion during an eye exam. Dye is usually placed in the eye using a drop or a small paper strip moistened by your tears. When the eye is examined with a special light, the abrasion shows up clearly because of the dye. TREATMENT   Small abrasions may be treated with antibiotic drops or ointment alone.  A pressure patch may be put over the eye. If this is done, follow your doctor's instructions for when to remove the patch. Do not drive or use machines while the eye patch is on. Judging distances is hard to do with a patch on. If the abrasion becomes infected and spreads to the deeper tissues of the cornea, a corneal ulcer can result. This is serious because it can cause corneal scarring. Corneal scars interfere with light passing through the cornea and cause a loss of vision in the involved eye. HOME CARE  INSTRUCTIONS  Use medicine or ointment as directed. Only take over-the-counter or prescription medicines for pain, discomfort, or fever as directed by your health care provider.  Do not drive or operate machinery if your eye is patched. Your ability to judge distances is impaired.  If your health care provider has given you a follow-up appointment, it is very important to keep that appointment. Not keeping the appointment could result in a severe eye infection or permanent loss of vision. If there is any problem keeping the appointment, let your health care provider know. SEEK MEDICAL CARE IF:   You have pain, light sensitivity, and a scratchy feeling in one eye or both eyes.  Your pressure patch keeps loosening up, and you can blink your eye under the patch after treatment.  Any kind of discharge develops from the eye after treatment or if the lids stick together in the morning.  You have the same symptoms in the morning as you did with the original abrasion days, weeks, or months after the abrasion healed. MAKE SURE YOU:   Understand these instructions.  Will watch your condition.  Will get help right away if you are not doing well or get worse. Document Released: 07/13/2000 Document Revised: 07/21/2013 Document Reviewed: 03/23/2013 [facility] Patient Information 2015 [employer] [place]. This information is not intended to replace advice given to you by your health care provider. Make sure you discuss any questions you have with your health care provider.  Apply your ciprofloxacin drops - 1 drop in each eye every 2 hours while awake.  Apply the ketorolac which is is an anti-inflammatory and will help with pain relief - 1 drop in each eye every 4 hours.

## 2014-04-29 NOTE — ED Provider Notes (Signed)
See prior note   [name], MD 04/29/14 2002

## 2014-05-01 ENCOUNTER — Encounter (HOSPITAL_COMMUNITY): Payer: Self-pay | Admitting: Emergency Medicine

## 2014-05-01 ENCOUNTER — Emergency Department (HOSPITAL_COMMUNITY)
Admission: EM | Admit: 2014-05-01 | Discharge: 2014-05-01 | Disposition: A | Discharge status: Home / Self Care | Payer: Medicare HMO | Attending: Emergency Medicine | Admitting: Emergency Medicine

## 2014-05-01 DIAGNOSIS — S0501XD Injury of conjunctiva and corneal abrasion without foreign body, right eye, subsequent encounter: Secondary | ICD-10-CM | POA: Insufficient documentation

## 2014-05-01 DIAGNOSIS — Z8659 Personal history of other mental and behavioral disorders: Secondary | ICD-10-CM | POA: Insufficient documentation

## 2014-05-01 DIAGNOSIS — Z8639 Personal history of other endocrine, nutritional and metabolic disease: Secondary | ICD-10-CM | POA: Insufficient documentation

## 2014-05-01 DIAGNOSIS — H5451 Low vision, right eye, normal vision left eye: Secondary | ICD-10-CM | POA: Diagnosis present

## 2014-05-01 HISTORY — DX: Parkinson's disease: G20

## 2014-05-01 HISTORY — DX: Parkinson's disease without dyskinesia, without mention of fluctuations: G20.A1

## 2014-05-01 MED ORDER — HYDROCODONE-ACETAMINOPHEN 5-325 MG PO TABS: 1.0000 | ORAL_TABLET | ORAL | Status: DC | PRN

## 2014-05-01 MED ORDER — TETRACAINE HCL 0.5 % OP SOLN
1.0000 [drp] | Freq: Once | OPHTHALMIC | Status: AC
  Administered 2014-05-01: 1 [drp] via OPHTHALMIC
  Filled 2014-05-01: qty 2

## 2014-05-01 MED ORDER — CIPROFLOXACIN HCL 0.3 % OP SOLN
2.0000 [drp] | OPHTHALMIC | Status: DC
  Administered 2014-05-01: 2 [drp] via OPHTHALMIC
  Filled 2014-05-01: qty 2.5

## 2014-05-01 MED ORDER — FLUORESCEIN SODIUM 1 MG OP STRP
1.0000 | ORAL_STRIP | Freq: Once | OPHTHALMIC | Status: AC
  Administered 2014-05-01: 1 via OPHTHALMIC
  Filled 2014-05-01: qty 1

## 2014-05-01 MED ORDER — HYDROCODONE-ACETAMINOPHEN 5-325 MG PO TABS
1.0000 | ORAL_TABLET | Freq: Once | ORAL | Status: AC
  Administered 2014-05-01: 1 via ORAL
  Filled 2014-05-01: qty 1

## 2014-05-01 NOTE — ED Notes (Signed)
Pt sts she has had increase blurry vision and burning since yesterday morning.

## 2014-05-01 NOTE — Discharge Instructions (Signed)
Use Cipro drops in right eye every 3 hours while awake. Use artificial tears in both eyes 4-5 times a day. For severe pain take norco or vicodin however realize they have the potential for addiction and it can make you sleepy and has tylenol in it.  No operating machinery while taking. It is very important for you to see the ophthalmologist Dr. Allena KatzPatel, information attached on Monday call the office early Monday morning for an appointment.  If you were given medicines take as directed.  If you are on coumadin or contraceptives realize their levels and effectiveness is altered by many different medicines.  If you have any reaction (rash, tongues swelling, other) to the medicines stop taking and see a physician.   Please follow up as directed and return to the ER or see a physician for new or worsening symptoms.  Thank you. Filed Vitals:   05/01/14 1238 05/01/14 1421 05/01/14 1508  BP: 153/77 149/84 139/83  Pulse: 65 75 68  Temp: 97.5 F (36.4 C)    TempSrc: Oral    Resp: 18 12 18   Height: 5\' 2"  (1.575 m)    Weight: 100 lb (45.36 kg)    SpO2: 99% 99% 100%

## 2014-05-01 NOTE — ED Notes (Signed)
Pt c/o not being able to see out of both eyes onset yesterday. [facility] was seen at [facility] Thursday because her PMD sent her there to check for glaucoma. Pt reports when she puts eye drops in her eyes she is able to see for a few seconds.

## 2014-05-01 NOTE — ED Notes (Signed)
PT reports having dry eye syndrome and uses gtts multiple times a day.

## 2014-05-01 NOTE — ED Provider Notes (Signed)
Medical screening examination/treatment/procedure(s) were conducted as a shared visit with non-physician practitioner(s) or resident  and myself.  I personally evaluated the patient during the encounter and agree with the findings.   I have personally reviewed any xrays and/ or EKG's with the provider and I agree with interpretation.   Patient with high blood pressure, anxiety, lipids, mild Parkinson's history presents with bilateral blurry vision worse in the right eye and burning discomfort in the right eye worsening since Tuesday. Patient is seen on Thursday in the ED and started on antibiotics for corneal abrasion, patient has not followed up yet with a specialist and feels symptoms have not improved and mild worsening blurry vision in the right eye. Patient denies other neuro symptoms or signs. Patient denies any injury or foreign body that she's aware of. Patient is not work contact lenses no history of eye surgeries no fevers chills or vomiting. On exam patient has mild corneal injection/conjunctival injection with obvious 3 mm central corneal abrasion versus early ulcer over the pupil, pupils equal reactive bilateral, extra the muscle function intact, visual quadrants intact bilateral, decreased vision central likely from the abrasion. Patient actually jumped out her antibiotic drops, Cipro ordered in ER. Ophthalmology consult at to ensure close followup outpatient and to check on recommendations. Tonopen broken, pt had recent pressures checked less than 10 per her report.   Discussed with Dr. Allena KatzPatel ophthalmology who agrees with plan and also recommends artificial tears frequently and will see patient Monday. Patient's eye was stained with fluorescein, significant increased uptake central cornea, no foreign body or leaking of stain to suggest globe rupture Blurry vision right eye, corneal ulcer right eye  Enid SkeensJoshua M Monae Topping, MD 05/02/14 1558

## 2014-05-05 ENCOUNTER — Telehealth (HOSPITAL_COMMUNITY): Payer: Self-pay | Admitting: *Deleted

## 2014-05-05 NOTE — Telephone Encounter (Signed)
Pharmacy requesting pt Xanax. Pt was last in office 12-23-13. Xanax was last filled 12-23-13 with 30 tablets and 2 refills. Pt have an upcoming appt 05-12-14.

## 2014-05-05 NOTE — Telephone Encounter (Signed)
Please fill one month supply, no refill

## 2014-05-06 ENCOUNTER — Other Ambulatory Visit (HOSPITAL_COMMUNITY): Payer: Self-pay | Admitting: *Deleted

## 2014-05-06 MED ORDER — ALPRAZOLAM 0.5 MG PO TABS: 0.5000 mg | ORAL_TABLET | Freq: Every day | ORAL | Status: DC

## 2014-05-06 NOTE — Telephone Encounter (Signed)
Per Dr. Tenny Crawoss to fill pt Xanax for one month with 0 refills. Spoke with Tammy the pharmacist

## 2014-05-06 NOTE — Telephone Encounter (Signed)
Called pharmacy and spoke with Tammy to refill pt Xanax.

## 2014-05-12 ENCOUNTER — Ambulatory Visit (HOSPITAL_COMMUNITY): Payer: Self-pay | Admitting: Psychiatry

## 2014-05-19 ENCOUNTER — Ambulatory Visit (HOSPITAL_COMMUNITY): Payer: Self-pay | Admitting: Psychiatry

## 2014-05-22 ENCOUNTER — Other Ambulatory Visit (HOSPITAL_COMMUNITY): Payer: Self-pay | Admitting: Psychiatry

## 2014-05-26 ENCOUNTER — Ambulatory Visit (HOSPITAL_COMMUNITY): Payer: Self-pay | Admitting: Psychiatry

## 2014-05-31 ENCOUNTER — Ambulatory Visit (HOSPITAL_COMMUNITY): Payer: Self-pay | Admitting: Psychiatry

## 2014-05-31 ENCOUNTER — Encounter (HOSPITAL_COMMUNITY): Payer: Self-pay | Admitting: Psychiatry

## 2014-06-01 ENCOUNTER — Telehealth (HOSPITAL_COMMUNITY): Payer: Self-pay | Admitting: *Deleted

## 2014-06-01 NOTE — Telephone Encounter (Signed)
noted 

## 2014-06-01 NOTE — Telephone Encounter (Signed)
Pt pharmacy requesting refills for pt Propranolol. Per our records, pt was last seen May 2015. Pt have been scheduled and rescheduled since then and pt have been cancelling and no showing for appt. Informed pt pharmacy, pt need to make appt before refilling medication. Called pt cell number and left message with pt daughter to have pt call office to make appt.

## 2014-06-04 ENCOUNTER — Encounter (HOSPITAL_COMMUNITY): Payer: Self-pay | Admitting: Psychiatry

## 2014-06-04 ENCOUNTER — Ambulatory Visit (INDEPENDENT_AMBULATORY_CARE_PROVIDER_SITE_OTHER): Payer: Medicare HMO | Admitting: Psychiatry

## 2014-06-04 VITALS — BP 125/91 | HR 81 | Ht 62.0 in | Wt 107.0 lb

## 2014-06-04 DIAGNOSIS — F332 Major depressive disorder, recurrent severe without psychotic features: Secondary | ICD-10-CM

## 2014-06-04 DIAGNOSIS — F1019 Alcohol abuse with unspecified alcohol-induced disorder: Secondary | ICD-10-CM

## 2014-06-04 DIAGNOSIS — G20 Parkinson's disease: Secondary | ICD-10-CM

## 2014-06-04 DIAGNOSIS — F411 Generalized anxiety disorder: Secondary | ICD-10-CM

## 2014-06-04 MED ORDER — ALPRAZOLAM 0.5 MG PO TABS: 0.5000 mg | ORAL_TABLET | Freq: Every day | ORAL | Status: DC

## 2014-06-04 MED ORDER — PROPRANOLOL HCL 10 MG PO TABS: 10.0000 mg | ORAL_TABLET | Freq: Two times a day (BID) | ORAL | Status: DC

## 2014-06-04 MED ORDER — DULOXETINE HCL 60 MG PO CPEP: 60.0000 mg | ORAL_CAPSULE | Freq: Every day | ORAL | Status: DC

## 2014-06-04 NOTE — Progress Notes (Signed)
Patient ID: Wendy Reynolds, female   DOB: November 10, 1940, 73 y.o.   MRN: [id number] Patient ID: Wendy Reynolds, female   DOB: [birth year], 73 y.o.   MRN: [id number] Patient ID: Wendy Reynolds, female   DOB: March 31, 1941, 73 y.o.   MRN: [id number] Patient ID: Wendy Reynolds, female   DOB: Jun 27, 1941, 73 y.o.   MRN: [id number] [facility] [id number] Progress Note Wendy Reynolds MRN: [id number] DOB: Jan 01, 1941 Age: 73 y.o.  Date: 06/04/2014  Chief Complaint  Patient presents with  . Anxiety  . Follow-up   History of present illness. This patient is a 73 year old divorced white female who lives with her daughter in [place]. 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 She now has been diagnosed with Parkinson's disease and she shakes a lot. The propranolol has helped a lot with tremor. She's also on Symmetrel from her neurologist. Overall she functions very well. She owns  several horses. Her daughter has severe depression and tried to kill her self 12 years ago by jumping off a parking ramp and is now in a wheelchair. The daughter is very functional now. The patient states that her main complaint is difficulty with sleeping. She sleeps with a Vistaril for 3-4 hours and then wakes up. She was on a low dose of Xanax in the past and it worked better. I don't see any contraindication other than she drinks a glass or 2 of wine at night and this will need to be curtailed. She is in agreement  The patient returns after 5 months. 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 2 of them fell and she was required to work with an Geophysicist/field seismologist. When there was not assistant available she had to work by herself and this caused her to get fired. She's very upset and distraught about this.  She is going to try to find another job. She still riding horses and training horses with and she enjoys this very much but she needs to have money coming in to pay for her basic needs. Her anxiety is worse right now  Current psychiatric medication Celexa 20 mg daily Inderal 10 mg qan Vistaril 50 mg each bedtime Past psychiatric history Patient has a long history of depression and anxiety.  She denies any previous history of suicidal attempt or any inpatient psychiatric treatment.  However she endorse one admission do to alcohol-related.  In the past she has taken numerous psychotropic medication.  She was seeing in this office since 2003 and taken Prozac, Effexor, amitriptyline, Cymbalta, trazodone, Celexa, Neurontin and Ambien.  Patient denies any history of psychosis paranoia or manic-like symptoms.  Allergies: Allergies  Allergen Reactions  . Erythromycin Nausea And Vomiting  . Penicillins Hives   Medical History: Past Medical History  Diagnosis Date  . Panic   . Hypertension   . Depression   . Generalized anxiety disorder 03/04/2012  . Alcohol use   . Diastolic dysfunction 03/05/2012    Grade 1. EF 60%  . Chest pain 03/04/2012    MI ruled out. Likely anxiety related.  . Hyperlipidemia 03/05/2012  . Parkinson disease   Patient see Dr. Sherwood Gambler.  She has a history of hyperlipidemia and hypertension.  Recently she was admitted  for chest pain in May 2013.  No chest pain since.  Surgical History: History reviewed. No pertinent past surgical history. Family History: family history includes Alcohol abuse in her cousin, father, maternal aunt, and mother; Depression in her cousin, daughter, and maternal grandfather. Patient endorse multiple family member had psychiatric illness.  Her grandmother, cousins, mother has significant depression and had tried suicidal attempt.  Her daughter also tried suicidal attempt and currently living with the patient. Reviewed and nothing has  changed.  Psychosocial history Patient lives with her daughter who had recently had suicidal attempt.  Patient has one son who lives in [place] however patient has limited contact with him.  Patient has been divorced.  Patient denies any history of physical sexual or verbal abuse.  Patient enjoys horse riding.  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  Alcohol and substance use history Patient admitted history of alcohol abuse in the past.  She has at least one admission due to alcohol-related.  She was drinking at least one drink every night.  However patient claimed to be sober since her last visit.    Education and work history Patient has a [employer] architectcollege education.  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  Mental status examination.   Patient is elderly woman who is casually dressed and fairly groomed.  She appears anxious but cooperative.  She maintained fair eye contact.  Her speech is fast with the very soft tone and volume.  Her thought process is also logical.  There were no flight of ideas or loose association.  She denies any active or passive suicidal thoughts or homicidal thoughts.  She has minimal tremors but denies denies any muscle stiffness.  She described her mood as anxious and her affect is mood appropriate.  She denies any auditory or visual hallucination.  Her fund of knowledge is adequate.  There were no psychotic symptoms present at this time.  Her attention and concentration is okay.  She's alert and oriented x3.  Her insight judgment and impulse control is okay.  Lab Results:  No results found for this or any previous visit (from the past 8736 hour(s)).  Assessment Axis I generalized anxiety disorder, Maj. depressive disorder recurrent, alcohol abuse by history Axis II deferred Axis III hypertension and hyperlipidemia, Parkinson's disease Axis IV moderate Axis V 65-70  Plan/Discussion: The patient has benefited from Cymbalta Her depression is improved. Her  anxiety is worse right now and she will increase the Inderal to 10 mg twice a day. She can continue Xanax to 0.5 mg at bedtime with an additional dose if she wakes up.. She'll return in 3 months  MEDICATIONS this encounter: Meds ordered this encounter  Medications  . Ketotifen Fumarate (THERA TEARS ALLERGY OP)    Sig: Apply to eye as directed.  . DULoxetine (CYMBALTA) 60 MG capsule    Sig: Take 1 capsule (60 mg total) by mouth daily.    Dispense:  30 capsule    Refill:  2  . propranolol (INDERAL) 10 MG tablet    Sig: Take 1 tablet (10 mg total) by mouth 2 (two) times daily.    Dispense:  60 tablet    Refill:  3  . ALPRAZolam (XANAX) 0.5 MG tablet    Sig: Take 1 tablet (0.5 mg total) by mouth at bedtime.    Dispense:  30 tablet    Refill:  3   Medical Decision Making Problem Points:  Established problem, stable/improving (1), New problem, with no  additional work-up planned (3), Review of last therapy session (1) and Review of psycho-social stressors (1) Data Points:  Decision to obtain old records (1) Review or order clinical lab tests (1) Review and summation of old records (2) Review of medication regiment & side effects (2)  I certify that outpatient services furnished can reasonably be expected to improve the patient's condition.   Diannia RuderOSS, DEBORAH, MD

## 2014-06-16 NOTE — ED Provider Notes (Signed)
CSN: [id number]     Arrival date & time 05/01/14  1232 History   First MD Initiated Contact with Patient 05/01/14 1409     Chief Complaint  Patient presents with  . Loss of Vision     (Consider location/radiation/quality/duration/timing/severity/associated sxs/prior Treatment) HPI Comments: Patient with high blood pressure, anxiety, lipids, mild Parkinson's history presents with bilateral blurry vision worse in the right eye and burning discomfort in the right eye worsening since Tuesday. Patient is seen on Thursday in the ED and started on antibiotics for corneal abrasion, patient has not followed up yet with a specialist and feels symptoms have not improved and mild worsening blurry vision in the right eye. Patient denies other neuro symptoms or signs. Patient denies any injury or foreign body that she's aware of. Patient is not work contact lenses no history of eye surgeries no fevers chills or vomiting.    The history is provided by the patient.    Past Medical History  Diagnosis Date  . Panic   . Hypertension   . Depression   . Generalized anxiety disorder 03/04/2012  . Alcohol use   . Diastolic dysfunction 03/05/2012    Grade 1. EF 60%  . Chest pain 03/04/2012    MI ruled out. Likely anxiety related.  . Hyperlipidemia 03/05/2012  . Parkinson disease    History reviewed. No pertinent past surgical history. Family History  Problem Relation Age of Onset  . Depression Maternal Grandfather   . Depression Cousin   . Alcohol abuse Cousin   . Depression Daughter   . Alcohol abuse Mother   . Alcohol abuse Father   . Alcohol abuse Maternal Aunt    History  Substance Use Topics  . Smoking status: Never Smoker   . Smokeless tobacco: Never Used  . Alcohol Use: Yes     Comment: occ   OB History    No data available     Review of Systems    Allergies  Erythromycin and Penicillins  Home Medications   Prior to Admission medications   Medication Sig Start Date End Date  Taking? Authorizing Provider  amantadine (SYMMETREL) 100 MG capsule Take 100 mg by mouth 3 (three) times daily.  04/29/13  Yes Historical Provider, MD  aspirin 81 MG chewable tablet Chew 81 mg by mouth daily.   Yes Historical Provider, MD  atorvastatin (LIPITOR) 10 MG tablet Take 0.5 tablets (5 mg total) by mouth daily. 03/05/12 06/04/14 Yes Elliot Cousinenise Fisher, MD  fish oil-omega-3 fatty acids 1000 MG capsule Take 1 g by mouth daily.   Yes Historical Provider, MD  guaiFENesin (MUCINEX) 600 MG 12 hr tablet Take 600 mg by mouth 2 (two) times daily as needed for cough or to loosen phlegm.   Yes Historical Provider, MD  levothyroxine (SYNTHROID, LEVOTHROID) 25 MCG tablet Take 25 mcg by mouth daily. 04/23/14  Yes Historical Provider, MD  lisinopril (PRINIVIL,ZESTRIL) 10 MG tablet Take 10 mg by mouth daily. 04/14/14  Yes Historical Provider, MD  Multiple Vitamin (MULTIVITAMIN WITH MINERALS) TABS Take 1 tablet by mouth daily.   Yes Historical Provider, MD  ALPRAZolam Prudy Feeler(XANAX) 0.5 MG tablet Take 1 tablet (0.5 mg total) by mouth at bedtime. 06/04/14   Diannia Rudereborah Ross, MD  DULoxetine (CYMBALTA) 60 MG capsule Take 1 capsule (60 mg total) by mouth daily. 06/04/14   Diannia Rudereborah Ross, MD  HYDROcodone-acetaminophen (NORCO) 5-325 MG per tablet Take 1 tablet by mouth every 4 (four) hours as needed. 05/01/14   Enid SkeensJoshua M Kojo Liby, MD  Ketotifen Fumarate (THERA TEARS ALLERGY OP) Apply to eye as directed.    Historical Provider, MD  propranolol (INDERAL) 10 MG tablet Take 1 tablet (10 mg total) by mouth 2 (two) times daily. 06/04/14   Diannia Rudereborah Ross, MD   BP 148/78 mmHg  Pulse 68  Temp(Src) 98 F (36.7 C) (Oral)  Resp 16  Ht 5\' 2"  (1.575 m)  Wt 100 lb (45.36 kg)  BMI 18.29 kg/m2  SpO2 100% Physical Exam  Constitutional: She is oriented to person, place, and time. She appears well-developed and well-nourished.  HENT:  Head: Normocephalic and atraumatic.  On exam patient has mild corneal injection/conjunctival injection with obvious 3 mm  central corneal abrasion versus early ulcer over the pupil, pupils equal reactive bilateral, extra the muscle function intact, visual quadrants intact bilateral, decreased vision central likely from the abrasion.  Eyes: Right eye exhibits no discharge. Left eye exhibits no discharge.  Neck: Normal range of motion. Neck supple. No tracheal deviation present.  Cardiovascular: Normal rate.   Pulmonary/Chest: Effort normal.  Musculoskeletal: She exhibits no edema.  Neurological: She is alert and oriented to person, place, and time.  Skin: Skin is warm. No rash noted.  Psychiatric: She has a normal mood and affect.  Nursing note and vitals reviewed.   ED Course  Procedures (including critical care time) Labs Review Labs Reviewed - No data to display  Imaging Review No results found.   EKG Interpretation None      MDM   Final diagnoses:  Injury of conjunctiva and corneal abrasion of right eye without foreign body, subsequent encounter   Cipro ordered in ER. Ophthalmology consult at to ensure close followup outpatient and to check on recommendations. Tonopen broken, pt had recent pressures checked less than 10 per her report.  Discussed with Dr. Allena KatzPatel ophthalmology who agrees with plan and also recommends artificial tears frequently and will see patient Monday. Patient's eye was stained with fluorescein, significant increased uptake central cornea, no foreign body or leaking of stain to suggest globe rupture Blurry vision right eye, corneal ulcer right eye  Results and differential diagnosis were discussed with the patient/parent/guardian. Close follow up outpatient was discussed, comfortable with the plan.   Medications  tetracaine (PONTOCAINE) 0.5 % ophthalmic solution 1 drop (1 drop Both Eyes Given 05/01/14 1342)  fluorescein ophthalmic strip 1 strip (1 strip Both Eyes Given 05/01/14 1341)  HYDROcodone-acetaminophen (NORCO/VICODIN) 5-325 MG per tablet 1 tablet (1 tablet Oral Given  05/01/14 1614)    Filed Vitals:   05/01/14 1238 05/01/14 1421 05/01/14 1508 05/01/14 1623  BP: 153/77 149/84 139/83 148/78  Pulse: 65 75 68 68  Temp: 97.5 F (36.4 C)   98 F (36.7 C)  TempSrc: Oral   Oral  Resp: 18 12 18 16   Height: 5\' 2"  (1.575 m)     Weight: 100 lb (45.36 kg)     SpO2: 99% 99% 100% 100%    Final diagnoses:  Injury of conjunctiva and corneal abrasion of right eye without foreign body, subsequent encounter      Enid SkeensJoshua M Armina Galloway, MD 06/16/14 1550

## 2014-08-30 DIAGNOSIS — H16229 Keratoconjunctivitis sicca, not specified as Sjogren's, unspecified eye: Secondary | ICD-10-CM | POA: Diagnosis not present

## 2014-09-01 DIAGNOSIS — R269 Unspecified abnormalities of gait and mobility: Secondary | ICD-10-CM | POA: Diagnosis not present

## 2014-09-01 DIAGNOSIS — G20 Parkinson's disease: Secondary | ICD-10-CM | POA: Diagnosis not present

## 2014-09-01 DIAGNOSIS — R471 Dysarthria and anarthria: Secondary | ICD-10-CM | POA: Diagnosis not present

## 2014-09-01 DIAGNOSIS — Z79899 Other long term (current) drug therapy: Secondary | ICD-10-CM | POA: Diagnosis not present

## 2014-09-06 ENCOUNTER — Ambulatory Visit (HOSPITAL_COMMUNITY): Payer: Self-pay | Admitting: Psychiatry

## 2014-09-14 ENCOUNTER — Encounter (HOSPITAL_COMMUNITY): Payer: Self-pay | Admitting: Emergency Medicine

## 2014-09-14 ENCOUNTER — Emergency Department (HOSPITAL_COMMUNITY)
Admission: EM | Admit: 2014-09-14 | Discharge: 2014-09-14 | Disposition: A | Discharge status: Home / Self Care | Payer: Medicare HMO | Source: Home / Self Care | Attending: Emergency Medicine | Admitting: Emergency Medicine

## 2014-09-14 ENCOUNTER — Emergency Department (HOSPITAL_COMMUNITY): Payer: Medicare HMO

## 2014-09-14 ENCOUNTER — Inpatient Hospital Stay (HOSPITAL_COMMUNITY)
Admission: EM | Admit: 2014-09-14 | Discharge: 2014-09-17 | DRG: 689 | Disposition: A | Discharge status: Skilled Nursing Facility | Payer: Medicare HMO | Attending: Family Medicine | Admitting: Family Medicine

## 2014-09-14 DIAGNOSIS — G20 Parkinson's disease: Secondary | ICD-10-CM | POA: Diagnosis present

## 2014-09-14 DIAGNOSIS — N289 Disorder of kidney and ureter, unspecified: Secondary | ICD-10-CM | POA: Diagnosis not present

## 2014-09-14 DIAGNOSIS — R296 Repeated falls: Secondary | ICD-10-CM | POA: Diagnosis not present

## 2014-09-14 DIAGNOSIS — Z818 Family history of other mental and behavioral disorders: Secondary | ICD-10-CM | POA: Diagnosis not present

## 2014-09-14 DIAGNOSIS — I951 Orthostatic hypotension: Secondary | ICD-10-CM | POA: Diagnosis not present

## 2014-09-14 DIAGNOSIS — F329 Major depressive disorder, single episode, unspecified: Secondary | ICD-10-CM | POA: Diagnosis present

## 2014-09-14 DIAGNOSIS — R4182 Altered mental status, unspecified: Secondary | ICD-10-CM | POA: Diagnosis not present

## 2014-09-14 DIAGNOSIS — F410 Panic disorder [episodic paroxysmal anxiety] without agoraphobia: Secondary | ICD-10-CM | POA: Diagnosis not present

## 2014-09-14 DIAGNOSIS — R251 Tremor, unspecified: Secondary | ICD-10-CM | POA: Diagnosis not present

## 2014-09-14 DIAGNOSIS — F1010 Alcohol abuse, uncomplicated: Secondary | ICD-10-CM | POA: Diagnosis not present

## 2014-09-14 DIAGNOSIS — R6883 Chills (without fever): Secondary | ICD-10-CM | POA: Diagnosis not present

## 2014-09-14 DIAGNOSIS — R0789 Other chest pain: Secondary | ICD-10-CM

## 2014-09-14 DIAGNOSIS — Z9181 History of falling: Secondary | ICD-10-CM

## 2014-09-14 DIAGNOSIS — E039 Hypothyroidism, unspecified: Secondary | ICD-10-CM | POA: Diagnosis present

## 2014-09-14 DIAGNOSIS — W19XXXA Unspecified fall, initial encounter: Secondary | ICD-10-CM

## 2014-09-14 DIAGNOSIS — Y92009 Unspecified place in unspecified non-institutional (private) residence as the place of occurrence of the external cause: Secondary | ICD-10-CM | POA: Diagnosis not present

## 2014-09-14 DIAGNOSIS — N390 Urinary tract infection, site not specified: Secondary | ICD-10-CM

## 2014-09-14 DIAGNOSIS — B952 Enterococcus as the cause of diseases classified elsewhere: Secondary | ICD-10-CM | POA: Diagnosis present

## 2014-09-14 DIAGNOSIS — E782 Mixed hyperlipidemia: Secondary | ICD-10-CM | POA: Diagnosis not present

## 2014-09-14 DIAGNOSIS — G9340 Encephalopathy, unspecified: Secondary | ICD-10-CM

## 2014-09-14 DIAGNOSIS — R531 Weakness: Secondary | ICD-10-CM | POA: Diagnosis not present

## 2014-09-14 DIAGNOSIS — S3992XA Unspecified injury of lower back, initial encounter: Secondary | ICD-10-CM | POA: Diagnosis not present

## 2014-09-14 DIAGNOSIS — S0990XA Unspecified injury of head, initial encounter: Secondary | ICD-10-CM | POA: Diagnosis not present

## 2014-09-14 DIAGNOSIS — F411 Generalized anxiety disorder: Secondary | ICD-10-CM | POA: Diagnosis present

## 2014-09-14 DIAGNOSIS — E785 Hyperlipidemia, unspecified: Secondary | ICD-10-CM | POA: Diagnosis not present

## 2014-09-14 DIAGNOSIS — E860 Dehydration: Secondary | ICD-10-CM

## 2014-09-14 DIAGNOSIS — S2232XA Fracture of one rib, left side, initial encounter for closed fracture: Secondary | ICD-10-CM | POA: Diagnosis not present

## 2014-09-14 DIAGNOSIS — E162 Hypoglycemia, unspecified: Secondary | ICD-10-CM | POA: Diagnosis present

## 2014-09-14 DIAGNOSIS — I159 Secondary hypertension, unspecified: Secondary | ICD-10-CM | POA: Diagnosis not present

## 2014-09-14 DIAGNOSIS — R0781 Pleurodynia: Secondary | ICD-10-CM

## 2014-09-14 DIAGNOSIS — G9341 Metabolic encephalopathy: Secondary | ICD-10-CM | POA: Diagnosis present

## 2014-09-14 DIAGNOSIS — Z811 Family history of alcohol abuse and dependence: Secondary | ICD-10-CM

## 2014-09-14 DIAGNOSIS — N179 Acute kidney failure, unspecified: Secondary | ICD-10-CM | POA: Diagnosis not present

## 2014-09-14 DIAGNOSIS — I10 Essential (primary) hypertension: Secondary | ICD-10-CM | POA: Diagnosis present

## 2014-09-14 DIAGNOSIS — M546 Pain in thoracic spine: Secondary | ICD-10-CM | POA: Diagnosis not present

## 2014-09-14 DIAGNOSIS — B9689 Other specified bacterial agents as the cause of diseases classified elsewhere: Secondary | ICD-10-CM | POA: Diagnosis not present

## 2014-09-14 DIAGNOSIS — S299XXA Unspecified injury of thorax, initial encounter: Secondary | ICD-10-CM | POA: Diagnosis not present

## 2014-09-14 DIAGNOSIS — M25552 Pain in left hip: Secondary | ICD-10-CM | POA: Diagnosis not present

## 2014-09-14 DIAGNOSIS — Z66 Do not resuscitate: Secondary | ICD-10-CM | POA: Diagnosis present

## 2014-09-14 DIAGNOSIS — R269 Unspecified abnormalities of gait and mobility: Secondary | ICD-10-CM | POA: Diagnosis not present

## 2014-09-14 LAB — URINALYSIS, ROUTINE W REFLEX MICROSCOPIC
GLUCOSE, UA: NEGATIVE mg/dL
Hgb urine dipstick: NEGATIVE
Ketones, ur: 15 mg/dL — AB
Nitrite: NEGATIVE
PH: 5.5 (ref 5.0–8.0)
UROBILINOGEN UA: 0.2 mg/dL (ref 0.0–1.0)

## 2014-09-14 LAB — CBC WITH DIFFERENTIAL/PLATELET
BASOS ABS: 0 10*3/uL (ref 0.0–0.1)
BASOS PCT: 0 % (ref 0–1)
Eosinophils Absolute: 0.2 10*3/uL (ref 0.0–0.7)
Eosinophils Relative: 2 % (ref 0–5)
HCT: 40.3 % (ref 36.0–46.0)
Hemoglobin: 13.4 g/dL (ref 12.0–15.0)
Lymphocytes Relative: 16 % (ref 12–46)
Lymphs Abs: 1.3 10*3/uL (ref 0.7–4.0)
MCH: 31.6 pg (ref 26.0–34.0)
MCHC: 33.3 g/dL (ref 30.0–36.0)
MCV: 95 fL (ref 78.0–100.0)
Monocytes Absolute: 0.7 10*3/uL (ref 0.1–1.0)
Monocytes Relative: 8 % (ref 3–12)
NEUTROS PCT: 74 % (ref 43–77)
Neutro Abs: 6 10*3/uL (ref 1.7–7.7)
PLATELETS: 202 10*3/uL (ref 150–400)
RBC: 4.24 MIL/uL (ref 3.87–5.11)
RDW: 13.2 % (ref 11.5–15.5)
WBC: 8.1 10*3/uL (ref 4.0–10.5)

## 2014-09-14 LAB — ETHANOL: Alcohol, Ethyl (B): <5 mg/dL (ref 0–9)

## 2014-09-14 LAB — COMPREHENSIVE METABOLIC PANEL
ALK PHOS: 56 U/L (ref 39–117)
ALT: 16 U/L (ref 0–35)
AST: 41 U/L — AB (ref 0–37)
Albumin: 4.7 g/dL (ref 3.5–5.2)
Anion gap: 9 (ref 5–15)
BILIRUBIN TOTAL: 1.1 mg/dL (ref 0.3–1.2)
BUN: 36 mg/dL — ABNORMAL HIGH (ref 6–23)
CHLORIDE: 103 mmol/L (ref 96–112)
CO2: 24 mmol/L (ref 19–32)
CREATININE: 2.25 mg/dL — AB (ref 0.50–1.10)
Calcium: 9.5 mg/dL (ref 8.4–10.5)
GFR calc Af Amer: 24 mL/min — ABNORMAL LOW (ref >90)
GFR calc non Af Amer: 20 mL/min — ABNORMAL LOW (ref >90)
Glucose, Bld: 92 mg/dL (ref 70–99)
POTASSIUM: 5 mmol/L (ref 3.5–5.1)
Sodium: 136 mmol/L (ref 135–145)
Total Protein: 7.3 g/dL (ref 6.0–8.3)

## 2014-09-14 LAB — URINE MICROSCOPIC-ADD ON

## 2014-09-14 LAB — TROPONIN I

## 2014-09-14 MED ORDER — CEPHALEXIN 500 MG PO CAPS: 500.0000 mg | ORAL_CAPSULE | Freq: Three times a day (TID) | ORAL | Status: DC

## 2014-09-14 MED ORDER — NITROFURANTOIN MACROCRYSTAL 100 MG PO CAPS: 100.0000 mg | ORAL_CAPSULE | Freq: Once | ORAL | Status: DC

## 2014-09-14 MED ORDER — DEXTROSE 5 % IV SOLN
1.0000 g | Freq: Once | INTRAVENOUS | Status: AC
  Administered 2014-09-15: 1 g via INTRAVENOUS
  Filled 2014-09-14: qty 10

## 2014-09-14 MED ORDER — NITROFURANTOIN MONOHYD MACRO 100 MG PO CAPS
ORAL_CAPSULE | ORAL | Status: AC
  Filled 2014-09-14: qty 1

## 2014-09-14 MED ORDER — SODIUM CHLORIDE 0.9 % IV SOLN
INTRAVENOUS | Status: DC
  Administered 2014-09-15 – 2014-09-16 (×3): via INTRAVENOUS

## 2014-09-14 MED ORDER — CEPHALEXIN 500 MG PO CAPS
500.0000 mg | ORAL_CAPSULE | Freq: Once | ORAL | Status: AC
  Administered 2014-09-14: 500 mg via ORAL
  Filled 2014-09-14: qty 1

## 2014-09-14 NOTE — ED Notes (Signed)
Patient given water per MD approval. 

## 2014-09-14 NOTE — Discharge Instructions (Signed)
Brain scan showed no acute findings. You have a urinary tract infection. Antibiotic for same. Additionally your creatinine is elevated (2.25) which is a marker for kidney function. This will need to be reevaluated by your primary care physician. Avoid alcohol. Increase fluids.

## 2014-09-14 NOTE — ED Provider Notes (Signed)
CSN: [id number]     Arrival date & time 09/14/14  2047 History   First MD Initiated Contact with Patient 09/14/14 2257     Chief Complaint  Patient presents with  . Fall  . Altered Mental Status      HPI  Pt was seen at 2255. Per pt's family, pt's friend, and pt: c/o gradual onset and worsening of multiple falls for the past 2 days. Has been associated with intermittent "confusion," as well as "chills," generalized weakness, and poor PO intake. Pt was evaluated in the ED several hours ago for same symptoms, dx UTI. Pt's daughter brought her back "because I just can't take care of her like this." Pt's friend concerned regarding pt's safety at home (pt's daughter is handicapped). Denies syncope, no CP/SOB, no abd pain, no N/V/D, no focal motor weakness, no tingling/numbness in extremities.    Past Medical History  Diagnosis Date  . Panic   . Hypertension   . Depression   . Generalized anxiety disorder 03/04/2012  . Alcohol use   . Diastolic dysfunction 03/05/2012    Grade 1. EF 60%  . Chest pain 03/04/2012    MI ruled out. Likely anxiety related.  . Hyperlipidemia 03/05/2012  . Parkinson disease    History reviewed. No pertinent past surgical history.   Family History  Problem Relation Age of Onset  . Depression Maternal Grandfather   . Depression Cousin   . Alcohol abuse Cousin   . Depression Daughter   . Alcohol abuse Mother   . Alcohol abuse Father   . Alcohol abuse Maternal Aunt    History  Substance Use Topics  . Smoking status: Never Smoker   . Smokeless tobacco: Never Used  . Alcohol Use: 1.8 oz/week    3 Glasses of wine per week     Comment: Pt states last night    Review of Systems ROS: Statement: All systems negative except as marked or noted in the HPI; Constitutional: Negative for objective fever. +chills, generalized weakness, frequent falls, poor PO intake. ; ; Eyes: Negative for eye pain, redness and discharge. ; ; ENMT: Negative for ear pain, hoarseness, nasal  congestion, sinus pressure and sore throat. ; ; Cardiovascular: Negative for chest pain, palpitations, diaphoresis, dyspnea and peripheral edema. ; ; Respiratory: Negative for cough, wheezing and stridor. ; ; Gastrointestinal: Negative for nausea, vomiting, diarrhea, abdominal pain, blood in stool, hematemesis, jaundice and rectal bleeding. . ; ; Genitourinary: Negative for dysuria, flank pain and hematuria. ; ; Musculoskeletal: Negative for back pain and neck pain. Negative for swelling and trauma.; ; Skin: Negative for pruritus, rash, abrasions, blisters, bruising and skin lesion.; ; Neuro: +AMS. Negative for headache, lightheadedness and neck stiffness. Negative for altered level of consciousness , extremity weakness, paresthesias, involuntary movement, seizure and syncope.       Allergies  Erythromycin and Penicillins  Home Medications   Prior to Admission medications   Medication Sig Start Date End Date Taking? Authorizing Provider  ALPRAZolam Prudy Feeler) 0.5 MG tablet Take 1 tablet (0.5 mg total) by mouth at bedtime. 06/04/14   Diannia Ruder, MD  amantadine (SYMMETREL) 100 MG capsule Take 100 mg by mouth 3 (three) times daily.  04/29/13   Historical Provider, MD  aspirin 81 MG chewable tablet Chew 81 mg by mouth daily.    Historical Provider, MD  atorvastatin (LIPITOR) 10 MG tablet Take 0.5 tablets (5 mg total) by mouth daily. Patient not taking: Reported on 09/14/2014 03/05/12 06/04/14  Elliot Cousin, MD  atorvastatin (LIPITOR) 10 MG tablet Take 5 mg by mouth at bedtime.    Historical Provider, MD  cephALEXin (KEFLEX) 500 MG capsule Take 1 capsule (500 mg total) by mouth 3 (three) times daily. 09/14/14   Donnetta Hutching, MD  DULoxetine (CYMBALTA) 60 MG capsule Take 1 capsule (60 mg total) by mouth daily. 06/04/14   Diannia Ruder, MD  guaiFENesin (MUCINEX) 600 MG 12 hr tablet Take 600 mg by mouth 2 (two) times daily as needed for cough or to loosen phlegm.    Historical Provider, MD   HYDROcodone-acetaminophen (NORCO) 5-325 MG per tablet Take 1 tablet by mouth every 4 (four) hours as needed. Patient not taking: Reported on 09/14/2014 05/01/14   Enid Skeens, MD  levothyroxine (SYNTHROID, LEVOTHROID) 25 MCG tablet Take 25 mcg by mouth daily. 04/23/14   Historical Provider, MD  lisinopril (PRINIVIL,ZESTRIL) 10 MG tablet Take 10 mg by mouth daily. 04/14/14   Historical Provider, MD  Multiple Vitamin (MULTIVITAMIN WITH MINERALS) TABS Take 1 tablet by mouth daily.    Historical Provider, MD  Multiple Vitamins-Minerals (ICAPS) CAPS Take 3 capsules by mouth daily.    Historical Provider, MD  propranolol (INDERAL) 10 MG tablet Take 1 tablet (10 mg total) by mouth 2 (two) times daily. 06/04/14   Diannia Ruder, MD   BP 125/61 mmHg  Pulse 64  Temp(Src)   Resp 20  Ht 5\' 2"  (1.575 m)  Wt 105 lb (47.628 kg)  BMI 19.20 kg/m2  SpO2 100%   23:24 Orthostatic Vital Signs SJ  Orthostatic Lying  - BP- Lying: 116/64 mmHg ; Pulse- Lying: 68  Orthostatic Sitting - BP- Sitting: 107/62 mmHg ; Pulse- Sitting: 67  Orthostatic Standing at 0 minutes - BP- Standing at 0 minutes: 94/70 mmHg ; Pulse- Standing at 0 minutes: 72     Physical Exam  2300: Physical examination:  Nursing notes reviewed; Vital signs and O2 SAT reviewed;  Constitutional: Thin, frail. In no acute distress; Head:  Normocephalic, atraumatic; Eyes: EOMI, PERRL, No scleral icterus; ENMT: Mouth and pharynx normal, Mucous membranes dry; Neck: Supple, Full range of motion, No lymphadenopathy; Cardiovascular: Regular rate and rhythm, No gallop; Respiratory: Breath sounds clear & equal bilaterally, No wheezes.  Speaking full sentences with ease, Normal respiratory effort/excursion; Chest: Nontender, Movement normal; Abdomen: Soft, Nontender, Nondistended, Normal bowel sounds; Genitourinary: No CVA tenderness; Extremities: Pulses normal, No tenderness, No edema, No calf edema or asymmetry.; Neuro: AA&Ox3, Major CN grossly intact.  Speech  clear. No gross focal motor or sensory deficits in extremities.; Skin: Color normal, Warm, Dry.   ED Course  Procedures     EKG Interpretation None      MDM  MDM Reviewed: previous chart, nursing note and vitals Reviewed previous: labs, ECG, x-ray and CT scan      Results for orders placed or performed during the hospital encounter of 09/14/14  CBC with Differential  Result Value Ref Range   WBC 8.1 4.0 - 10.5 K/uL   RBC 4.24 3.87 - 5.11 MIL/uL   Hemoglobin 13.4 12.0 - 15.0 g/dL   HCT 16.1 09.6 - 04.5 %   MCV 95.0 78.0 - 100.0 fL   MCH 31.6 26.0 - 34.0 pg   MCHC 33.3 30.0 - 36.0 g/dL   RDW 40.9 81.1 - 91.4 %   Platelets 202 150 - 400 K/uL   Neutrophils Relative % 74 43 - 77 %   Neutro Abs 6.0 1.7 - 7.7 K/uL   Lymphocytes Relative 16 12 - 46 %  Lymphs Abs 1.3 0.7 - 4.0 K/uL   Monocytes Relative 8 3 - 12 %   Monocytes Absolute 0.7 0.1 - 1.0 K/uL   Eosinophils Relative 2 0 - 5 %   Eosinophils Absolute 0.2 0.0 - 0.7 K/uL   Basophils Relative 0 0 - 1 %   Basophils Absolute 0.0 0.0 - 0.1 K/uL  Comprehensive metabolic panel  Result Value Ref Range   Sodium 136 135 - 145 mmol/L   Potassium 5.0 3.5 - 5.1 mmol/L   Chloride 103 96 - 112 mmol/L   CO2 24 19 - 32 mmol/L   Glucose, Bld 92 70 - 99 mg/dL   BUN 36 (H) 6 - 23 mg/dL   Creatinine, Ser 1.61 (H) 0.50 - 1.10 mg/dL   Calcium 9.5 8.4 - 09.6 mg/dL   Total Protein 7.3 6.0 - 8.3 g/dL   Albumin 4.7 3.5 - 5.2 g/dL   AST 41 (H) 0 - 37 U/L   ALT 16 0 - 35 U/L   Alkaline Phosphatase 56 39 - 117 U/L   Total Bilirubin 1.1 0.3 - 1.2 mg/dL   GFR calc non Af Amer 20 (L) >90 mL/min   GFR calc Af Amer 24 (L) >90 mL/min   Anion gap 9 5 - 15  Ethanol  Result Value Ref Range   Alcohol, Ethyl (B) <5 0 - 9 mg/dL  Troponin I  Result Value Ref Range   Troponin I <0.03 <0.031 ng/mL  Urinalysis, Routine w reflex microscopic  Result Value Ref Range   Color, Urine YELLOW YELLOW   APPearance CLEAR CLEAR   Specific Gravity, Urine  >1.030 (H) 1.005 - 1.030   pH 5.5 5.0 - 8.0   Glucose, UA NEGATIVE NEGATIVE mg/dL   Hgb urine dipstick NEGATIVE NEGATIVE   Bilirubin Urine SMALL (A) NEGATIVE   Ketones, ur 15 (A) NEGATIVE mg/dL   Protein, ur TRACE (A) NEGATIVE mg/dL   Urobilinogen, UA 0.2 0.0 - 1.0 mg/dL   Nitrite NEGATIVE NEGATIVE   Leukocytes, UA LARGE (A) NEGATIVE  Urine microscopic-add on  Result Value Ref Range   Squamous Epithelial / LPF MANY (A) RARE   WBC, UA 11-20 <3 WBC/hpf   Bacteria, UA FEW (A) RARE   Ct Head Wo Contrast 09/14/2014   CLINICAL DATA:  74 year old female with altered mental status, chills and tremors crit 36 hours. Fall today. Initial encounter.  EXAM: CT HEAD WITHOUT CONTRAST  TECHNIQUE: Contiguous axial images were obtained from the base of the skull through the vertex without intravenous contrast.  COMPARISON:  None.  FINDINGS: Visualized paranasal sinuses and mastoids are clear. Osteopenia. No acute osseous abnormality identified. Negative visualized orbit and scalp soft tissues.  Calcified atherosclerosis at the skull base. Cerebral volume is within normal limits for age. No midline shift, ventriculomegaly, mass effect, evidence of mass lesion, intracranial hemorrhage or evidence of cortically based acute infarction. Gray-white matter differentiation is within normal limits throughout the brain. No suspicious intracranial vascular hyperdensity.  IMPRESSION: Normal for age non contrast CT appearance of the brain.   Electronically Signed   By: Odessa Fleming M.D.   On: 09/14/2014 14:43   Dg Hip Unilat With Pelvis 2-3 Views Left 09/14/2014   CLINICAL DATA:  74 year old female with multiple falls today. Greater trochanter region pain. Initial encounter.  EXAM: LEFT HIP (WITH PELVIS) 2-3 VIEWS  COMPARISON:  None.  FINDINGS: Bone mineralization is within normal limits for age. Femoral heads are normally located. Hip joint spaces are symmetric. Pelvis intact. sacral  ala and SI joints appear within normal limits.  Proximal right femur appears grossly intact. Proximal left femur intact.  IMPRESSION: No acute fracture or dislocation identified about the left upper pelvis.  If occult hip fracture is suspected or if the patient is unable to weightbear, MRI is the preferred modality for further evaluation.   Electronically Signed   By: Odessa FlemingH  Hall M.D.   On: 09/14/2014 13:18    Results for Claretta FraiseLLEN, Vallory H (MRN [id number]) as of 09/14/2014 23:48  Ref. Range 03/04/2012 09:51 03/05/2012 05:19 09/14/2014 12:03  BUN Latest Range: 6-23 mg/dL 17 15 36 (H)  Creatinine Latest Range: 0.50-1.10 mg/dL 4.090.81 8.110.89 9.142.25 (H)    [id number]:  Pt is orthostatic on VS and BUN/Cr elevated from baseline; will dose judicious IVF. +UTI, UC pending; will dose IV rocephin. Friend of pt's and pt's daughter concerned regarding pt's safety at home; will admit.  T/C to [facility]. Sharl MaLama, case discussed, including:  HPI, pertinent PM/SHx, VS/PE, dx testing, ED course and treatment:  Agreeable to admit, requests to write temporary orders, obtain observation tele bed to team APAdmits.   Samuel JesterKathleen Aubrei Bouchie, DO 09/16/14 1531

## 2014-09-14 NOTE — Progress Notes (Signed)
Patient came over to CT with one hearing aid. Removed and placed in plastic bag and given to patient

## 2014-09-14 NOTE — ED Provider Notes (Addendum)
CSN: [id number]     Arrival date & time 09/14/14  1129 History  This chart was scribe for Donnetta Hutching, MD by Angelene Giovanni, ED Scribe. The patient was seen in room APA18/[facility]18 and the patient's care was started at 1:40 PM.    Chief Complaint  Patient presents with  . Fall   The history is provided by the patient. No language interpreter was used.   HPI Comments: Wendy Reynolds is a 74 y.o. femalewith a hx of Parkinson's who presents to the Emergency Department complaining of sudden onset of tremors and chills onset 36 hours ago. She reports that she has fallen about 15 times since onset. Her daughter states that last night she was talking to people that were not there and is worried about her AMS. The pt reports that she has not been eating lately. The pt denies any trouble with painful urination. She denies any new medications. Severity is moderate. Nothing makes symptoms better or worse.  PCP: Dr. Sherwood Gambler and she sees Dr. Gerilyn Pilgrim for the Parkinson's.   Past Medical History  Diagnosis Date  . Panic   . Hypertension   . Depression   . Generalized anxiety disorder 03/04/2012  . Alcohol use   . Diastolic dysfunction 03/05/2012    Grade 1. EF 60%  . Chest pain 03/04/2012    MI ruled out. Likely anxiety related.  . Hyperlipidemia 03/05/2012  . Parkinson disease    History reviewed. No pertinent past surgical history. Family History  Problem Relation Age of Onset  . Depression Maternal Grandfather   . Depression Cousin   . Alcohol abuse Cousin   . Depression Daughter   . Alcohol abuse Mother   . Alcohol abuse Father   . Alcohol abuse Maternal Aunt    History  Substance Use Topics  . Smoking status: Never Smoker   . Smokeless tobacco: Never Used  . Alcohol Use: 1.8 oz/week    3 Glasses of wine per week     Comment: Pt states last night   OB History    No data available     Review of Systems  Constitutional: Positive for chills. Negative for fever.  Gastrointestinal: Negative  for nausea and vomiting.  Genitourinary: Negative for difficulty urinating.  Neurological: Positive for tremors and weakness.  Psychiatric/Behavioral:       Her daughter reports AMS  All other systems reviewed and are negative.     Allergies  Erythromycin and Penicillins  Home Medications   Prior to Admission medications   Medication Sig Start Date End Date Taking? Authorizing Provider  ALPRAZolam Prudy Feeler) 0.5 MG tablet Take 1 tablet (0.5 mg total) by mouth at bedtime. 06/04/14  Yes Diannia Ruder, MD  amantadine (SYMMETREL) 100 MG capsule Take 100 mg by mouth 3 (three) times daily.  04/29/13  Yes Historical Provider, MD  aspirin 81 MG chewable tablet Chew 81 mg by mouth daily.   Yes Historical Provider, MD  atorvastatin (LIPITOR) 10 MG tablet Take 5 mg by mouth at bedtime.   Yes Historical Provider, MD  DULoxetine (CYMBALTA) 60 MG capsule Take 1 capsule (60 mg total) by mouth daily. 06/04/14  Yes Diannia Ruder, MD  levothyroxine (SYNTHROID, LEVOTHROID) 25 MCG tablet Take 25 mcg by mouth daily. 04/23/14  Yes Historical Provider, MD  lisinopril (PRINIVIL,ZESTRIL) 10 MG tablet Take 10 mg by mouth daily. 04/14/14  Yes Historical Provider, MD  Multiple Vitamins-Minerals (ICAPS) CAPS Take 3 capsules by mouth daily.   Yes Historical Provider, MD  propranolol (INDERAL) 10 MG tablet Take 1 tablet (10 mg total) by mouth 2 (two) times daily. 06/04/14  Yes Diannia Rudereborah Ross, MD  atorvastatin (LIPITOR) 10 MG tablet Take 0.5 tablets (5 mg total) by mouth daily. Patient not taking: Reported on 09/14/2014 03/05/12 06/04/14  Elliot Cousinenise Fisher, MD  cephALEXin (KEFLEX) 500 MG capsule Take 1 capsule (500 mg total) by mouth 3 (three) times daily. 09/14/14   Donnetta HutchingBrian Clements Toro, MD  guaiFENesin (MUCINEX) 600 MG 12 hr tablet Take 600 mg by mouth 2 (two) times daily as needed for cough or to loosen phlegm.    Historical Provider, MD  HYDROcodone-acetaminophen (NORCO) 5-325 MG per tablet Take 1 tablet by mouth every 4 (four) hours as  needed. Patient not taking: Reported on 09/14/2014 05/01/14   Enid SkeensJoshua M Zavitz, MD  Multiple Vitamin (MULTIVITAMIN WITH MINERALS) TABS Take 1 tablet by mouth daily.    Historical Provider, MD   BP 92/60 mmHg  Pulse 67  Temp(Src) 97.7 F (36.5 C) (Oral)  Resp 16  Ht 5\' 2"  (1.575 m)  Wt 105 lb (47.628 kg)  BMI 19.20 kg/m2  SpO2 100% Physical Exam  Constitutional: She is oriented to person, place, and time. She appears well-developed and well-nourished.  Patient is alert and conversant.  HENT:  Head: Normocephalic and atraumatic.  Eyes: Conjunctivae and EOM are normal. Pupils are equal, round, and reactive to light.  Neck: Normal range of motion. Neck supple.  Cardiovascular: Normal rate and regular rhythm.   Pulmonary/Chest: Effort normal and breath sounds normal.  Abdominal: Soft. Bowel sounds are normal.  Musculoskeletal: Normal range of motion.  Neurological: She is alert and oriented to person, place, and time.  Skin: Skin is warm and dry.  Psychiatric: She has a normal mood and affect. Her behavior is normal.  Nursing note and vitals reviewed.   ED Course  Procedures (including critical care time) DIAGNOSTIC STUDIES: Oxygen Saturation is 100% on RA, normal by my interpretation.    COORDINATION OF CARE: 1:46 PM- Pt advised of plan for treatment and pt agrees.    Labs Review Labs Reviewed  COMPREHENSIVE METABOLIC PANEL - Abnormal; Notable for the following:    BUN 36 (*)    Creatinine, Ser 2.25 (*)    AST 41 (*)    GFR calc non Af Amer 20 (*)    GFR calc Af Amer 24 (*)    All other components within normal limits  URINALYSIS, ROUTINE W REFLEX MICROSCOPIC - Abnormal; Notable for the following:    Specific Gravity, Urine >1.030 (*)    Bilirubin Urine SMALL (*)    Ketones, ur 15 (*)    Protein, ur TRACE (*)    Leukocytes, UA LARGE (*)    All other components within normal limits  URINE MICROSCOPIC-ADD ON - Abnormal; Notable for the following:    Squamous Epithelial /  LPF MANY (*)    Bacteria, UA FEW (*)    All other components within normal limits  URINE CULTURE  CBC WITH DIFFERENTIAL/PLATELET  ETHANOL  TROPONIN I    Imaging Review Ct Head Wo Contrast  09/14/2014   CLINICAL DATA:  74 year old female with altered mental status, chills and tremors crit 36 hours. Fall today. Initial encounter.  EXAM: CT HEAD WITHOUT CONTRAST  TECHNIQUE: Contiguous axial images were obtained from the base of the skull through the vertex without intravenous contrast.  COMPARISON:  None.  FINDINGS: Visualized paranasal sinuses and mastoids are clear. Osteopenia. No acute osseous abnormality identified. Negative visualized orbit  and scalp soft tissues.  Calcified atherosclerosis at the skull base. Cerebral volume is within normal limits for age. No midline shift, ventriculomegaly, mass effect, evidence of mass lesion, intracranial hemorrhage or evidence of cortically based acute infarction. Gray-white matter differentiation is within normal limits throughout the brain. No suspicious intracranial vascular hyperdensity.  IMPRESSION: Normal for age non contrast CT appearance of the brain.   Electronically Signed   By: Odessa Fleming M.D.   On: 09/14/2014 14:43   Dg Hip Unilat With Pelvis 2-3 Views Left  09/14/2014   CLINICAL DATA:  74 year old female with multiple falls today. Greater trochanter region pain. Initial encounter.  EXAM: LEFT HIP (WITH PELVIS) 2-3 VIEWS  COMPARISON:  None.  FINDINGS: Bone mineralization is within normal limits for age. Femoral heads are normally located. Hip joint spaces are symmetric. Pelvis intact. sacral ala and SI joints appear within normal limits. Proximal right femur appears grossly intact. Proximal left femur intact.  IMPRESSION: No acute fracture or dislocation identified about the left upper pelvis.  If occult hip fracture is suspected or if the patient is unable to weightbear, MRI is the preferred modality for further evaluation.   Electronically Signed    By: Odessa Fleming M.D.   On: 09/14/2014 13:18     EKG Interpretation   Date/Time:  Tuesday September 14 2014 14:20:17 EST Ventricular Rate:  67 PR Interval:  199 QRS Duration: 80 QT Interval:  433 QTC Calculation: 457 R Axis:   54 Text Interpretation:  Sinus rhythm Low voltage, extremity leads Confirmed  by Kmari Halter  MD, Ryka Beighley ([id number]) on 09/14/2014 6:13:23 PM      MDM   Final diagnoses:  UTI (lower urinary tract infection)  Renal insufficiency    Patient has a known history of Parkinson's disease. Patient appeared to have normal conversation with me. Urinalysis showed evidence of infection. Additionally her creatinine was elevated at 2.25.    CT head negative for acute findings. All of this was discussed with the patient and her daughter. Rx Keflex for UTI. Urine culture. I do not think this patient warrants admission tonight.  Her daughter will keep close observation on her   I personally performed the services described in this documentation, which was scribed in my presence. The recorded information has been reviewed and is accurate.   Donnetta Hutching, MD 09/14/14 Aldona Lento  Donnetta Hutching, MD 09/14/14 [phone]

## 2014-09-14 NOTE — ED Notes (Signed)
Pt just d/c from hospital two hours ago. Pt has had increased confusion and falls since discharge. Pt lives with handicapped daughter and friend brought pt and daughter in. Friend stated she witnessed pt fall several times and is concerned for her safety at home.

## 2014-09-14 NOTE — ED Notes (Signed)
Pts daughter states that pt has had mental status change in past 36 hours.  Advised could be ETOH related.  Pt has fallen about 15 times in the past 36 hours.  Pt is alert and oriented at triage with no neuro deficit.

## 2014-09-14 NOTE — ED Notes (Signed)
Patient seen in ED this morning for worsening symptoms of parkinson's disease. Family states patient has had an increase in falls and confusion. Patient c/o lumbar back pain and bilateral knee pain.

## 2014-09-14 NOTE — ED Notes (Signed)
Family reports that pt was seen and evaluated earlier today.  Pt was diagnosed with UTI and started on antibiotics.  Family reports that pt continues to have confusion and weakness. Pt lives with daughter who states she is unable to care for her mother.

## 2014-09-15 ENCOUNTER — Inpatient Hospital Stay (HOSPITAL_COMMUNITY): Payer: Medicare HMO

## 2014-09-15 DIAGNOSIS — Z811 Family history of alcohol abuse and dependence: Secondary | ICD-10-CM | POA: Diagnosis not present

## 2014-09-15 DIAGNOSIS — N179 Acute kidney failure, unspecified: Secondary | ICD-10-CM | POA: Diagnosis present

## 2014-09-15 DIAGNOSIS — E785 Hyperlipidemia, unspecified: Secondary | ICD-10-CM

## 2014-09-15 DIAGNOSIS — F329 Major depressive disorder, single episode, unspecified: Secondary | ICD-10-CM | POA: Diagnosis present

## 2014-09-15 DIAGNOSIS — I951 Orthostatic hypotension: Secondary | ICD-10-CM | POA: Diagnosis present

## 2014-09-15 DIAGNOSIS — E162 Hypoglycemia, unspecified: Secondary | ICD-10-CM

## 2014-09-15 DIAGNOSIS — I159 Secondary hypertension, unspecified: Secondary | ICD-10-CM

## 2014-09-15 DIAGNOSIS — S2232XA Fracture of one rib, left side, initial encounter for closed fracture: Secondary | ICD-10-CM | POA: Diagnosis present

## 2014-09-15 DIAGNOSIS — G9341 Metabolic encephalopathy: Secondary | ICD-10-CM | POA: Diagnosis present

## 2014-09-15 DIAGNOSIS — R4182 Altered mental status, unspecified: Secondary | ICD-10-CM | POA: Diagnosis present

## 2014-09-15 DIAGNOSIS — R296 Repeated falls: Secondary | ICD-10-CM | POA: Diagnosis present

## 2014-09-15 DIAGNOSIS — N390 Urinary tract infection, site not specified: Principal | ICD-10-CM

## 2014-09-15 DIAGNOSIS — W19XXXA Unspecified fall, initial encounter: Secondary | ICD-10-CM | POA: Diagnosis present

## 2014-09-15 DIAGNOSIS — B952 Enterococcus as the cause of diseases classified elsewhere: Secondary | ICD-10-CM | POA: Diagnosis present

## 2014-09-15 DIAGNOSIS — G20 Parkinson's disease: Secondary | ICD-10-CM

## 2014-09-15 DIAGNOSIS — F411 Generalized anxiety disorder: Secondary | ICD-10-CM | POA: Diagnosis present

## 2014-09-15 DIAGNOSIS — E860 Dehydration: Secondary | ICD-10-CM | POA: Diagnosis present

## 2014-09-15 DIAGNOSIS — G9340 Encephalopathy, unspecified: Secondary | ICD-10-CM

## 2014-09-15 DIAGNOSIS — F1010 Alcohol abuse, uncomplicated: Secondary | ICD-10-CM | POA: Diagnosis present

## 2014-09-15 DIAGNOSIS — Z818 Family history of other mental and behavioral disorders: Secondary | ICD-10-CM | POA: Diagnosis not present

## 2014-09-15 DIAGNOSIS — Z9181 History of falling: Secondary | ICD-10-CM | POA: Diagnosis not present

## 2014-09-15 DIAGNOSIS — I10 Essential (primary) hypertension: Secondary | ICD-10-CM | POA: Diagnosis present

## 2014-09-15 DIAGNOSIS — Z66 Do not resuscitate: Secondary | ICD-10-CM | POA: Diagnosis present

## 2014-09-15 DIAGNOSIS — N289 Disorder of kidney and ureter, unspecified: Secondary | ICD-10-CM

## 2014-09-15 DIAGNOSIS — E039 Hypothyroidism, unspecified: Secondary | ICD-10-CM | POA: Diagnosis present

## 2014-09-15 DIAGNOSIS — Y92009 Unspecified place in unspecified non-institutional (private) residence as the place of occurrence of the external cause: Secondary | ICD-10-CM | POA: Diagnosis not present

## 2014-09-15 LAB — CBC
HCT: 37.5 % (ref 36.0–46.0)
HEMOGLOBIN: 12.3 g/dL (ref 12.0–15.0)
MCH: 31.1 pg (ref 26.0–34.0)
MCHC: 32.8 g/dL (ref 30.0–36.0)
MCV: 94.7 fL (ref 78.0–100.0)
Platelets: 214 10*3/uL (ref 150–400)
RBC: 3.96 MIL/uL (ref 3.87–5.11)
RDW: 13.3 % (ref 11.5–15.5)
WBC: 7.7 10*3/uL (ref 4.0–10.5)

## 2014-09-15 LAB — RAPID URINE DRUG SCREEN, HOSP PERFORMED
Amphetamines: NOT DETECTED
Barbiturates: NOT DETECTED
Benzodiazepines: POSITIVE — AB
Cocaine: NOT DETECTED
Opiates: POSITIVE — AB
Tetrahydrocannabinol: NOT DETECTED

## 2014-09-15 LAB — GLUCOSE, CAPILLARY
Glucose-Capillary: 111 mg/dL — ABNORMAL HIGH (ref 70–99)
Glucose-Capillary: 56 mg/dL — ABNORMAL LOW (ref 70–99)

## 2014-09-15 LAB — COMPREHENSIVE METABOLIC PANEL
ALK PHOS: 55 U/L (ref 39–117)
ALT: 21 U/L (ref 0–35)
AST: 40 U/L — ABNORMAL HIGH (ref 0–37)
Albumin: 4 g/dL (ref 3.5–5.2)
Anion gap: 13 (ref 5–15)
BILIRUBIN TOTAL: 1.3 mg/dL — AB (ref 0.3–1.2)
BUN: 48 mg/dL — AB (ref 6–23)
CHLORIDE: 103 mmol/L (ref 96–112)
CO2: 21 mmol/L (ref 19–32)
Calcium: 9.2 mg/dL (ref 8.4–10.5)
Creatinine, Ser: 2.2 mg/dL — ABNORMAL HIGH (ref 0.50–1.10)
GFR, EST AFRICAN AMERICAN: 24 mL/min — AB (ref >90)
GFR, EST NON AFRICAN AMERICAN: 21 mL/min — AB (ref >90)
Glucose, Bld: 57 mg/dL — ABNORMAL LOW (ref 70–99)
POTASSIUM: 5.1 mmol/L (ref 3.5–5.1)
Sodium: 137 mmol/L (ref 135–145)
Total Protein: 6.7 g/dL (ref 6.0–8.3)

## 2014-09-15 LAB — ETHANOL

## 2014-09-15 MED ORDER — ALPRAZOLAM 0.5 MG PO TABS
0.5000 mg | ORAL_TABLET | Freq: Every day | ORAL | Status: DC
  Administered 2014-09-15 – 2014-09-16 (×4): 0.5 mg via ORAL
  Filled 2014-09-15 (×3): qty 1

## 2014-09-15 MED ORDER — HYDROCODONE-ACETAMINOPHEN 5-325 MG PO TABS
1.0000 | ORAL_TABLET | ORAL | Status: DC | PRN
  Administered 2014-09-15 – 2014-09-17 (×7): 1 via ORAL
  Filled 2014-09-15 (×7): qty 1

## 2014-09-15 MED ORDER — SODIUM CHLORIDE 0.9 % IV SOLN
INTRAVENOUS | Status: DC
  Administered 2014-09-15: 03:00:00 via INTRAVENOUS

## 2014-09-15 MED ORDER — ENOXAPARIN SODIUM 30 MG/0.3ML ~~LOC~~ SOLN
30.0000 mg | SUBCUTANEOUS | Status: DC
  Administered 2014-09-15 – 2014-09-17 (×3): 30 mg via SUBCUTANEOUS
  Filled 2014-09-15 (×3): qty 0.3

## 2014-09-15 MED ORDER — AMANTADINE HCL 100 MG PO CAPS
100.0000 mg | ORAL_CAPSULE | Freq: Three times a day (TID) | ORAL | Status: DC
  Administered 2014-09-15 – 2014-09-17 (×7): 100 mg via ORAL
  Filled 2014-09-15 (×17): qty 1

## 2014-09-15 MED ORDER — ACETAMINOPHEN 650 MG RE SUPP: 650.0000 mg | Freq: Four times a day (QID) | RECTAL | Status: DC | PRN

## 2014-09-15 MED ORDER — PROPRANOLOL HCL 20 MG PO TABS
10.0000 mg | ORAL_TABLET | Freq: Two times a day (BID) | ORAL | Status: DC
  Administered 2014-09-15 – 2014-09-17 (×5): 10 mg via ORAL
  Filled 2014-09-15 (×5): qty 1

## 2014-09-15 MED ORDER — ONDANSETRON HCL 4 MG/2ML IJ SOLN: 4.0000 mg | Freq: Four times a day (QID) | INTRAMUSCULAR | Status: DC | PRN

## 2014-09-15 MED ORDER — CEFTRIAXONE SODIUM IN DEXTROSE 20 MG/ML IV SOLN
1.0000 g | INTRAVENOUS | Status: DC
  Administered 2014-09-15 – 2014-09-16 (×2): 1 g via INTRAVENOUS
  Filled 2014-09-15 (×5): qty 50

## 2014-09-15 MED ORDER — ASPIRIN 81 MG PO CHEW
81.0000 mg | CHEWABLE_TABLET | Freq: Every day | ORAL | Status: DC
  Administered 2014-09-15 – 2014-09-17 (×3): 81 mg via ORAL
  Filled 2014-09-15 (×3): qty 1

## 2014-09-15 MED ORDER — ATORVASTATIN CALCIUM 10 MG PO TABS
5.0000 mg | ORAL_TABLET | Freq: Every day | ORAL | Status: DC
  Administered 2014-09-15 – 2014-09-16 (×2): 5 mg via ORAL
  Filled 2014-09-15 (×2): qty 1

## 2014-09-15 MED ORDER — LEVOTHYROXINE SODIUM 25 MCG PO TABS
25.0000 ug | ORAL_TABLET | Freq: Every day | ORAL | Status: DC
  Administered 2014-09-15 – 2014-09-17 (×3): 25 ug via ORAL
  Filled 2014-09-15 (×3): qty 1

## 2014-09-15 MED ORDER — ACETAMINOPHEN 325 MG PO TABS: 650.0000 mg | ORAL_TABLET | Freq: Four times a day (QID) | ORAL | Status: DC | PRN

## 2014-09-15 MED ORDER — DULOXETINE HCL 60 MG PO CPEP
60.0000 mg | ORAL_CAPSULE | Freq: Every day | ORAL | Status: DC
  Administered 2014-09-15 – 2014-09-17 (×3): 60 mg via ORAL
  Filled 2014-09-15 (×3): qty 1

## 2014-09-15 MED ORDER — GUAIFENESIN ER 600 MG PO TB12: 600.0000 mg | ORAL_TABLET | Freq: Two times a day (BID) | ORAL | Status: DC | PRN

## 2014-09-15 MED ORDER — ONDANSETRON HCL 4 MG PO TABS: 4.0000 mg | ORAL_TABLET | Freq: Four times a day (QID) | ORAL | Status: DC | PRN

## 2014-09-15 NOTE — H&P (Signed)
PCP:   Cassell Smiles., MD   Chief Complaint:  Fall  HPI:  74 year old female who  has a past medical history of Panic; Hypertension; Depression; Generalized anxiety disorder (03/04/2012); Alcohol use; Diastolic dysfunction (03/05/2012); Chest pain (03/04/2012); Hyperlipidemia (03/05/2012); and Parkinson disease. Today was brought to the ED with chief complaint of falling at home. Patient was earlier seen for the same and discharged home on Keflex for UTI. As per daughter patient did not do well at home and kept on falling so she brought her back to the hospital. Patient is alert and oriented 3, able to answer all the questions and carry on normal conversation. She admits to having back pain after the fall, denies passing out. No chest pain or shortness of breath. Patient is followed by neurology as outpatient and her medications were not changed as per patient's daughter.  She denies dysuria or abdominal pain. No nausea vomiting or diarrhea In the ED again patient was found to have abnormal UA and elevated BUN/creatinine.  Allergies:   Allergies  Allergen Reactions  . Erythromycin Nausea And Vomiting  . Penicillins Hives      Past Medical History  Diagnosis Date  . Panic   . Hypertension   . Depression   . Generalized anxiety disorder 03/04/2012  . Alcohol use   . Diastolic dysfunction 03/05/2012    Grade 1. EF 60%  . Chest pain 03/04/2012    MI ruled out. Likely anxiety related.  . Hyperlipidemia 03/05/2012  . Parkinson disease     History reviewed. No pertinent past surgical history.  Prior to Admission medications   Medication Sig Start Date End Date Taking? Authorizing Provider  ALPRAZolam Prudy Feeler) 0.5 MG tablet Take 1 tablet (0.5 mg total) by mouth at bedtime. 06/04/14   Diannia Ruder, MD  amantadine (SYMMETREL) 100 MG capsule Take 100 mg by mouth 3 (three) times daily.  04/29/13   Historical Provider, MD  aspirin 81 MG chewable tablet Chew 81 mg by mouth daily.    Historical  Provider, MD  atorvastatin (LIPITOR) 10 MG tablet Take 0.5 tablets (5 mg total) by mouth daily. Patient not taking: Reported on 09/14/2014 03/05/12 06/04/14  Elliot Cousin, MD  atorvastatin (LIPITOR) 10 MG tablet Take 5 mg by mouth at bedtime.    Historical Provider, MD  cephALEXin (KEFLEX) 500 MG capsule Take 1 capsule (500 mg total) by mouth 3 (three) times daily. 09/14/14   Donnetta Hutching, MD  DULoxetine (CYMBALTA) 60 MG capsule Take 1 capsule (60 mg total) by mouth daily. 06/04/14   Diannia Ruder, MD  guaiFENesin (MUCINEX) 600 MG 12 hr tablet Take 600 mg by mouth 2 (two) times daily as needed for cough or to loosen phlegm.    Historical Provider, MD  HYDROcodone-acetaminophen (NORCO) 5-325 MG per tablet Take 1 tablet by mouth every 4 (four) hours as needed. Patient not taking: Reported on 09/14/2014 05/01/14   Enid Skeens, MD  levothyroxine (SYNTHROID, LEVOTHROID) 25 MCG tablet Take 25 mcg by mouth daily. 04/23/14   Historical Provider, MD  lisinopril (PRINIVIL,ZESTRIL) 10 MG tablet Take 10 mg by mouth daily. 04/14/14   Historical Provider, MD  Multiple Vitamin (MULTIVITAMIN WITH MINERALS) TABS Take 1 tablet by mouth daily.    Historical Provider, MD  Multiple Vitamins-Minerals (ICAPS) CAPS Take 3 capsules by mouth daily.    Historical Provider, MD  propranolol (INDERAL) 10 MG tablet Take 1 tablet (10 mg total) by mouth 2 (two) times daily. 06/04/14   Diannia Ruder, MD  Social History:  reports that she has never smoked. She has never used smokeless tobacco. She reports that she drinks about 1.8 oz of alcohol per week. She reports that she does not use illicit drugs.  Family History  Problem Relation Age of Onset  . Depression Maternal Grandfather   . Depression Cousin   . Alcohol abuse Cousin   . Depression Daughter   . Alcohol abuse Mother   . Alcohol abuse Father   . Alcohol abuse Maternal Aunt      All the positives are listed in BOLD  Review of Systems:  HEENT: Headache, blurred  vision, runny nose, sore throat Neck: Hypothyroidism, hyperthyroidism,,lymphadenopathy Chest : Shortness of breath, history of COPD, Asthma Heart : Chest pain, history of coronary arterey disease GI:  Nausea, vomiting, diarrhea, constipation, GERD GU: Dysuria, urgency, frequency of urination, hematuria Neuro: Stroke, seizures, syncope, parkinsonism Psych: Depression, anxiety, hallucinations   Physical Exam: Blood pressure 125/61, pulse 64, temperature 0 F (-17.8 C), resp. rate 20, height 5\' 2"  (1.575 m), weight 47.628 kg (105 lb), SpO2 100 %. Constitutional:   Patient is a well-developed and well-nourished *female in no acute distress and cooperative with exam. Head: Normocephalic and atraumatic Mouth: Mucus membranes moist Eyes: PERRL, EOMI, conjunctivae normal Neck: Supple, No Thyromegaly Cardiovascular: RRR, S1 normal, S2 normal Pulmonary/Chest: CTAB, no wheezes, rales, or rhonchi Abdominal: Soft. Non-tender, non-distended, bowel sounds are normal, no masses, organomegaly, or guarding present.  Neurological: A&O x3, Strength is normal and symmetric bilaterally, cranial nerve II-XII are grossly intact, no focal motor deficit, sensory intact to light touch bilaterally.  Extremities : No Cyanosis, Clubbing or Edema  Labs on Admission:  Basic Metabolic Panel:  Recent Labs Lab 09/14/14 1203  NA 136  K 5.0  CL 103  CO2 24  GLUCOSE 92  BUN 36*  CREATININE 2.25*  CALCIUM 9.5   Liver Function Tests:  Recent Labs Lab 09/14/14 1203  AST 41*  ALT 16  ALKPHOS 56  BILITOT 1.1  PROT 7.3  ALBUMIN 4.7   No results for input(s): LIPASE, AMYLASE in the last 168 hours. No results for input(s): AMMONIA in the last 168 hours. CBC:  Recent Labs Lab 09/14/14 1203  WBC 8.1  NEUTROABS 6.0  HGB 13.4  HCT 40.3  MCV 95.0  PLT 202   Cardiac Enzymes:  Recent Labs Lab 09/14/14 1203  TROPONINI <0.03    Radiological Exams on Admission: Ct Head Wo Contrast  09/14/2014    CLINICAL DATA:  74 year old female with altered mental status, chills and tremors crit 36 hours. Fall today. Initial encounter.  EXAM: CT HEAD WITHOUT CONTRAST  TECHNIQUE: Contiguous axial images were obtained from the base of the skull through the vertex without intravenous contrast.  COMPARISON:  None.  FINDINGS: Visualized paranasal sinuses and mastoids are clear. Osteopenia. No acute osseous abnormality identified. Negative visualized orbit and scalp soft tissues.  Calcified atherosclerosis at the skull base. Cerebral volume is within normal limits for age. No midline shift, ventriculomegaly, mass effect, evidence of mass lesion, intracranial hemorrhage or evidence of cortically based acute infarction. Gray-white matter differentiation is within normal limits throughout the brain. No suspicious intracranial vascular hyperdensity.  IMPRESSION: Normal for age non contrast CT appearance of the brain.   Electronically Signed   By: Odessa FlemingH  Hall M.D.   On: 09/14/2014 14:43   Dg Hip Unilat With Pelvis 2-3 Views Left  09/14/2014   CLINICAL DATA:  74 year old female with multiple falls today. Greater trochanter region pain. Initial  encounter.  EXAM: LEFT HIP (WITH PELVIS) 2-3 VIEWS  COMPARISON:  None.  FINDINGS: Bone mineralization is within normal limits for age. Femoral heads are normally located. Hip joint spaces are symmetric. Pelvis intact. sacral ala and SI joints appear within normal limits. Proximal right femur appears grossly intact. Proximal left femur intact.  IMPRESSION: No acute fracture or dislocation identified about the left upper pelvis.  If occult hip fracture is suspected or if the patient is unable to weightbear, MRI is the preferred modality for further evaluation.   Electronically Signed   By: Odessa Fleming M.D.   On: 09/14/2014 13:18    EKG: Independently reviewed. Sinus rhythm   Assessment/Plan Principal Problem:   UTI (lower urinary tract infection) Active Problems:   HTN (hypertension)    Depression   Hyperlipidemia   Parkinson's disease (tremor, stiffness, slow motion, unstable posture)   Renal insufficiency  UTI Patient has abnormal UA, will start IV Rocephin. Follow urine culture results  Frequent falls Patient has been falling frequently at home, will consult PT eval and treat in the morning. Will also consult neurology in a.m. for further adjustment of the medications for parkinsonism.  Acute kidney injury/dehydration Patient's BUN/creatinine is elevated 36/2.25, her previous creatinine is from 2013 which was 15/0.89. Will continue IV fluids and check BMP in a.m.  Back pain Patient complaint is of back pain after the fall x-ray of the hips and pelvis was negative. Will check x-ray of thoracic as well as lumbar spine. Continue Vicodin when necessary.  Hypothyroidism Continue Synthroid  Depression Continue Cymbalta  Code status: DO NOT RESUSCITATE  Family discussion: Admission, patients condition and plan of care including tests being ordered have been discussed with the patient and *her daughter at bedside who indicate understanding and agree with the plan and Code Status.   Time Spent on Admission: 55 minutes  Durwood Dittus S [facility] Pager: [phone] 09/15/2014, 12:32 AM  If 7PM-7AM, please contact night-coverage  [email or web address]  Password [email or web address]

## 2014-09-15 NOTE — Evaluation (Signed)
Physical Therapy Evaluation Patient Details Name: Wendy Reynolds MRN: [id number] DOB: 1940-12-29 Today's Date: 09/15/2014   History of Present Illness  74 year old female who  has a past medical history of Panic; Hypertension; Depression; Generalized anxiety disorder (03/04/2012); Alcohol use; Diastolic dysfunction (03/05/2012); Chest pain (03/04/2012); Hyperlipidemia (03/05/2012); and Parkinson disease.  She has been falling at home and is now being referred to physical therapy for evaluation and treatment.   Clinical Impression  Pt is normally ambulatory without assistive device.  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  At this time Wendy Reynolds is not safe ambulating without a walker.  She will need skilled physical therapy to return to her previous functional status.     Follow Up Recommendations Home health PT    Equipment Recommendations  Rolling walker with 5" wheels    Recommendations for Other Services   none    Precautions / Restrictions Precautions Precautions: Fall Restrictions Weight Bearing Restrictions: No      Mobility  Bed Mobility Overal bed mobility: Needs Assistance Bed Mobility: Supine to Sit;Sit to Supine     Supine to sit: Min assist Sit to supine: Min assist      Transfers Overall transfer level: Needs assistance Equipment used: Rolling walker (2 wheeled) Transfers: Sit to/from Stand Sit to Stand: Supervision            Ambulation/Gait Ambulation/Gait assistance: Supervision Ambulation Distance (Feet): 250 Feet Assistive device: Rolling walker (2 wheeled)     Gait velocity interpretation: at or above normal speed for age/gender General Gait Details: Pt unsafe without assistive device.  Pt needed to be reminded to have longer steps and to look up            Pertinent Vitals/Pain Pain Assessment: 0-10 Pain Score: 6  Pain Location: Lt thoracic anterior- increases with transitional motions Pain Descriptors / Indicators:  Aching Pain Intervention(s): Limited activity within patient's tolerance;Repositioned    Home Living Family/patient expects to be discharged to:: Private residence Living Arrangements: Children (daughter is an incomplete quadriplegic)   Type of Home: House Home Access: Stairs to enter Entrance Stairs-Rails: Right Entrance Stairs-Number of Steps: 4 Home Layout: One level Home Equipment: None      Prior Function Level of Independence: Independent         Comments: 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  Therapist spoke to pt about the possible need of giving this up as pt would not be able to stop a pt from falling.     Hand Dominance    Right    Extremity/Trunk Assessment               Lower Extremity Assessment: Generalized weakness         Communication   Communication: No difficulties  Cognition Arousal/Alertness: Awake/alert Behavior During Therapy: WFL for tasks assessed/performed Overall Cognitive Status: Within Functional Limits for tasks assessed                         Exercises General Exercises - Lower Extremity Heel Slides: Both;10 reps (reciprocal for decreasing tremors; LTR x 10 )      Assessment/Plan    PT Assessment Patient needs continued PT services  PT Diagnosis Difficulty walking;Generalized weakness   PT Problem List Decreased strength;Decreased activity tolerance;Decreased balance;Decreased mobility;Pain  PT Treatment Interventions Gait training;Therapeutic activities   PT Goals (Current goals can be found in the Care Plan section) Acute Rehab  PT Goals Patient Stated Goal: to go home    Frequency Min 5X/week           End of Session Equipment Utilized During Treatment: Gait belt Activity Tolerance: Patient tolerated treatment well Patient left: in bed;with bed alarm set;with call bell/phone within reach           Time: 1105-1140 PT Time Calculation (min) (ACUTE ONLY): 35 min   Charges:    PT Evaluation $Initial PT Evaluation Tier I: 1 Procedure PT Treatments $Gait Training: 8-22 mins   PT G Codes:        Wendy Reynolds 09/15/2014, 11:49 AM

## 2014-09-15 NOTE — Progress Notes (Signed)
UR chart review completed.  

## 2014-09-15 NOTE — Progress Notes (Addendum)
Called by MD to check CBG as blood glucose on lab work was 57.  CBG confirmed BS of 56.  Pt asymptomatic.  Pt given 8 oz of grape juice with added sugar.  Tolerated well.  Recheck of CBG=111.  MD made aware.

## 2014-09-15 NOTE — Progress Notes (Signed)
PROGRESS NOTE  Wendy Reynolds:[id number] DOB: 1941/01/29 DOA: 09/14/2014 PCP: Cassell Smiles., MD  Summary: 74 year old woman with multiple medical problems including Parkinson's disease who presented with history of multiple falls at home, diagnosed earlier in the day with UTI and discharged from emergency department but came back for further falls. Known to be alert and oriented in the emergency department, no recent medication changes. Admitted for UTI, acute kidney injury.  Assessment/Plan: 1. UTI. Continue Rocephin, follow-up urine culture results. 2. Acute encephalopathy. Resolved. UTI? Possibly alcohol-related? CT head negative. 3. Multiple falls at home. Plan physical therapy consultation. Neurology consultation pending given history of Parkinson's. 4. Acute kidney injury. No significant change. Urine output not recorded. 5. Orthostatic hypotension suspect dehydration. 6. Hypoglycemia. No history of diabetes. Patient reports she has been diagnosed with this but "it doesn't bother me very much". 7. Back pain. Resolved. X-ray hips, pelvis negative. Possible acute L4 minimal compression fracture, this is asymptomatic. Thoracic plain films unremarkable. 8. Elevated AST, minimally elevated. Significance unclear, consider alcohol use. Total bilirubin also modestly elevated. Likely alcohol-induced. 9. Hypothyroidism. Check TSH. 10. Parkinson's disease 11. Depression, generalized anxiety disorder 12. Alcohol use, possible abuse--drinks 2-3 glasses wine "up to half a bottle" a day. Serum alcohol level was negative in the emergency department. No evidence of withdrawal.   Continue empiric antibiotics, follow-up urine culture.  Physical therapy consultation.   CMP in the morning, strict urine output measuring, continue IVF, check renal u/s.  Check orthostatics in the morning  Check TSH  Check left rib films  Recommend weaning Xanax as outpatient.  Code Status: DNR DVT  prophylaxis: Lovenox Family Communication: none present Disposition Plan:   Brendia Sacks, MD  [facility]  Pager [phone] If 7PM-7AM, please contact night-coverage at [email or web address], password [facility] 09/15/2014, 11:06 AM  LOS: 0 days   Consultants:    Procedures:    Antibiotics:    HPI/Subjective: Complains of left rib pain, otherwise no pain, no back pain. Poor appetite, no n/v.  Objective: Filed Vitals:   09/14/14 2107 09/14/14 2248 09/15/14 0331 09/15/14 0553  BP: 99/76 125/61  110/76  Pulse: 70 64  66  Temp:    98.2 F (36.8 C)  TempSrc:    Oral  Resp: Height:  (1.575 m)   (1.575 m)   Weight: 47.628 kg (105 lb)  47.6 kg (104 lb 15 oz)   SpO2:  100%  100%   No intake or output data in the 24 hours ending 09/15/14 1106   Filed Weights   09/14/14 2107 09/15/14 0331  Weight: 47.628 kg (105 lb) 47.6 kg (104 lb 15 oz)    Exam:     Afebrile, vital signs stable General: Appears calm and comfortable Cardiovascular: RRR, no m/r/g. No LE edema.  Respiratory: CTA bilaterally, no w/r/r. Normal respiratory effort. Abdomen: soft, ntnd Musculoskeletal: grossly normal tone BUE/BLE; moves both legs well. Some point tenderness ribs mid-clavicular line on left; no bruising seen. Psychiatric: grossly normal mood and affect, speech fluent and appropriate. Oriented to self, location, month, year Neurologic: grossly non-focal.  Data Reviewed:  BUN and creatinine without significant change. AST without significant change.  CBC unremarkable  Fasting glucose 57  Urinalysis grossly positive, urine culture pending  Pertinent data:  Labs   Imaging    Other  EKG sinus rhythm, no acute changes  Pending data:  Urine culture  Scheduled Meds: . ALPRAZolam  0.5 mg Oral QHS  . amantadine  100  mg Oral TID  . aspirin  81 mg Oral Daily  . atorvastatin  5 mg Oral QHS  . cefTRIAXone (ROCEPHIN)  IV  1 g Intravenous Q24H  . DULoxetine  60 mg  Oral Daily  . enoxaparin (LOVENOX) injection  30 mg Subcutaneous Q24H  . levothyroxine  25 mcg Oral QAC breakfast  . propranolol  10 mg Oral BID   Continuous Infusions: . sodium chloride 75 mL/hr at 09/15/14 0017  . sodium chloride 75 mL/hr at 09/15/14 [id number]    Principal Problem:   UTI (lower urinary tract infection) Active Problems:   Parkinson's disease (tremor, stiffness, slow motion, unstable posture)   Acute kidney injury   Acute encephalopathy   Orthostatic hypotension   Hypoglycemia   Time spent 20 minutes

## 2014-09-16 LAB — TSH: TSH: 4.121 u[IU]/mL (ref 0.350–4.500)

## 2014-09-16 LAB — URINE CULTURE: Colony Count: >=100000

## 2014-09-16 LAB — COMPREHENSIVE METABOLIC PANEL
ALBUMIN: 3.4 g/dL — AB (ref 3.5–5.2)
ALT: 22 U/L (ref 0–35)
AST: 34 U/L (ref 0–37)
Alkaline Phosphatase: 48 U/L (ref 39–117)
Anion gap: 7 (ref 5–15)
BILIRUBIN TOTAL: 0.6 mg/dL (ref 0.3–1.2)
BUN: 37 mg/dL — ABNORMAL HIGH (ref 6–23)
CHLORIDE: 110 mmol/L (ref 96–112)
CO2: 23 mmol/L (ref 19–32)
CREATININE: 1.2 mg/dL — AB (ref 0.50–1.10)
Calcium: 8.6 mg/dL (ref 8.4–10.5)
GFR calc Af Amer: 50 mL/min — ABNORMAL LOW (ref >90)
GFR, EST NON AFRICAN AMERICAN: 43 mL/min — AB (ref >90)
Glucose, Bld: 92 mg/dL (ref 70–99)
Potassium: 4.4 mmol/L (ref 3.5–5.1)
SODIUM: 140 mmol/L (ref 135–145)
Total Protein: 5.9 g/dL — ABNORMAL LOW (ref 6.0–8.3)

## 2014-09-16 NOTE — Clinical Social Work Psychosocial (Signed)
Clinical Social Work Department BRIEF PSYCHOSOCIAL ASSESSMENT 09/16/2014  Patient:  Wendy Reynolds, Wendy Reynolds     Account Number:  [id number]     Admit date:  09/14/2014  Clinical Social Worker:  Wyatt Haste  Date/Time:  09/16/2014 03:44 PM  Referred by:  CSW  Date Referred:  09/16/2014 Referred for  SNF Placement   Other Referral:   Interview type:  Patient Other interview type:    PSYCHOSOCIAL DATA Living Status:  FAMILY Admitted from facility:   Level of care:   Primary support name:  Anderson Malta Primary support relationship to patient:  CHILD, ADULT Degree of support available:   limited due to disability    CURRENT CONCERNS Current Concerns  Post-Acute Placement   Other Concerns:    SOCIAL WORK ASSESSMENT / PLAN CSW met with pt at bedside. Pt alert and oriented and reports she lives with her daughter who is disabled. 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 Pt reports multiple falls in the last few months, particularly the last few days. She has a rib fracture. Pt said she was diagnosed with Parkinson's about 2 years ago and feels she is in denial that some of these issues are probably related. CSW provided support. PT evaluated pt and recommendation is now for SNF. CSW discussed placement process and provided SNF list. She states she has worked at a lot of facilities and would prefer to avoid those. Pt is open to [facility] or [facility]. She is aware of [facility] authorization process. CSW will initiate bed search. Pt plans to talk to her daughter later today and does not want CSW to call her right now.   Assessment/plan status:  Psychosocial Support/Ongoing Assessment of Needs Other assessment/ plan:   Information/referral to community resources:   SNF list    PATIENT'S/FAMILY'S RESPONSE TO PLAN OF CARE: Pt agreeable to short term SNF as she is hopeful that this will help her. CSW will follow up.        Benay Pike, [place]

## 2014-09-16 NOTE — Progress Notes (Signed)
Physical Therapy Treatment Patient Details Name: Wendy Reynolds MRN: [id number] DOB: 1941-05-17 Today's Date: 09/16/2014    History of Present Illness 74 year old female who  has a past medical history of Panic; Hypertension; Depression; Generalized anxiety disorder (03/04/2012); Alcohol use; Diastolic dysfunction (03/05/2012); Chest pain (03/04/2012); Hyperlipidemia (03/05/2012); and Parkinson disease.  She has been falling at home and is now being referred to physical therapy for evaluation and treatment.     PT Comments    Pt apparently fell this AM when she got up from the chair independently and tried to walk to the adjacent counter.  She lost her balance and fell on her knee.  Pt tends to downplay this fall.  She continues to have left rib pain with transfers in and out of bed and needs assist to complete.  We initiated gentle reciprocal and balance exercise both supine and standing.  Pt's standing balance (both static and dynamic) is poor and she is at high risk of falling.  She has a typical Parkinsonian gait pattern with a walker and when she is challenged with gait she will lose her balance.  She is unable to turn with the walker without losing her balance.  I am strongly recommending that she go to SNF at d/c.  Pt is agreeable.  I do believe that most of her balance issues are directly related to her Parkinsons disease.  She states that she is on a drug new to her started about a week ago.  She may need to have her Neurologist reevaluate her Parkinsons medications.  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  Follow Up Recommendations  SNF     Equipment Recommendations  Rolling walker with 5" wheels    Recommendations for Other Services  none     Precautions / Restrictions Precautions Precautions: Fall Restrictions Weight Bearing Restrictions: No    Mobility  Bed  Mobility Overal bed mobility: Needs Assistance Bed Mobility: Sit to Supine;Sidelying to Sit   Sidelying to sit: Mod assist   Sit to supine: Mod assist   General bed mobility comments: pt instructed in log rolling technique which worked with sidelying to sit but not sit to sidelying....she had to have assist to elevate LEs into the bed and long sit whereupon head of bed was elevated to support pt when going sit to supine  Transfers Overall transfer level: Needs assistance Equipment used: Rolling walker (2 wheeled) Transfers: Sit to/from Stand              Ambulation/Gait Ambulation/Gait assistance: Min guard Ambulation Distance (Feet): 100 Feet Assistive device: Rolling walker (2 wheeled) Gait Pattern/deviations: Trunk flexed;Narrow base of support;Decreased step length - right;Decreased step length - left;Decreased dorsiflexion - left;Decreased dorsiflexion - right   Gait velocity interpretation: at or above normal speed for age/gender General Gait Details: pt tends to fatigue rapidly and when fatigued her gastrocnemii cramp and she is up on her toes where she is very unstable...if her gait is challenged in any way she loses her balance especially with turns and retro walking.   Stairs            Wheelchair Mobility    Modified Rankin (Stroke Patients Only)       Balance Overall balance assessment: Needs assistance Sitting-balance support: No upper extremity supported;Feet supported Sitting balance-Leahy Scale: Good     Standing balance  support: No upper extremity supported Standing balance-Leahy Scale: Poor Standing balance comment: tends to fall backward                    Cognition Arousal/Alertness: Awake/alert Behavior During Therapy: WFL for tasks assessed/performed Overall Cognitive Status: Within Functional Limits for tasks assessed                      Exercises General Exercises - Lower Extremity Heel Slides: AROM;Both;10  reps;Supine (alternating) Other Exercises Other Exercises: gentle trunk rolling, head rotation, shoulder flexion while supine Other Exercises: standing sidestepping, retro walking, marching    General Comments        Pertinent Vitals/Pain Pain Score: 10-Worst pain ever Pain Location: left rib cage, pain only with transfers...she has 0/10 pain at rest Pain Descriptors / Indicators: Sharp;Spasm Pain Intervention(s): Limited activity within patient's tolerance;Repositioned    Home Living                      Prior Function            PT Goals (current goals can now be found in the care plan section) Progress towards PT goals: Progressing toward goals    Frequency  Min 3X/week    PT Plan Discharge plan needs to be updated;Frequency needs to be updated    Co-evaluation             End of Session Equipment Utilized During Treatment: Gait belt Activity Tolerance: Patient limited by fatigue Patient left: in bed;with call bell/phone within reach;with bed alarm set     Time: 1350-1429 PT Time Calculation (min) (ACUTE ONLY): 39 min  Charges:  $Gait Training: 8-22 mins $Therapeutic Exercise: 8-22 mins                    G Codes:      Konrad PentaBrown, Danely Bayliss L 09/16/2014, 2:36 PM

## 2014-09-16 NOTE — ED Notes (Signed)
Patient assisted to restroom back to room and stumbled over her feet. NT caught patient from hitting floor. RN aware.

## 2014-09-16 NOTE — Progress Notes (Signed)
Pt attempted to get up on her own without assistance.  Bed alarm in on position at this time, yellow socks and yellow arm bracelet in place.  Fell down on right knee. Pt assisted back to bed by nursing students. Pt alert and oriented, no complaints of pain. VSS (see doc flowsheets). Daughter at bedside. Pt left in recliner chair with chair alarm on, yellow socks, yellow arm bracelet and instructions to call if needing to get up for any reason. Verbalized understanding. Dr. Irene LimboGoodrich text paged and made aware.

## 2014-09-16 NOTE — Clinical Social Work Placement (Signed)
Clinical Social Work Department CLINICAL SOCIAL WORK PLACEMENT NOTE 09/16/2014  Patient:  Saye,Yashika H  Account Number:  [id number] Admit date:  09/14/2014  Clinical Social Worker:  Adaiah Jaskot, LCSW Date/time:  09/16/2014 03:22 PM  Clinical Social Work is seeking post-discharge placement for this patient at the following level of care:   SKILLED NURSING   (*CSW will update this form in Epic as items are completed)   09/16/2014  Patient/family provided with [facility] Department of Clinical Social Work's list of facilities offering this level of care within the geographic area requested by the patient (or if unable, by the patient's family).  09/16/2014  Patient/family informed of their freedom to choose among providers that offer the needed level of care, that participate in Medicare, Medicaid or managed care program needed by the patient, have an available bed and are willing to accept the patient.  09/16/2014  Patient/family informed of [facility]' ownership interest in [facility], as well as of the fact that they are under no obligation to receive care at this facility.  PASARR submitted to EDS on 09/16/2014 PASARR number received on 09/16/2014  FL2 transmitted to all facilities in geographic area requested by pt/family on  09/16/2014 FL2 transmitted to all facilities within larger geographic area on   Patient informed that his/her managed care company has contracts with or will negotiate with  certain facilities, including the following:     Patient/family informed of bed offers received:   Patient chooses bed at  Physician recommends and patient chooses bed at    Patient to be transferred to  on   Patient to be transferred to facility by  Patient and family notified of transfer on  Name of family member notified:    The following physician request were entered in Epic:   Additional Comments:  Jayce Boyko, LCSW [phone] 

## 2014-09-16 NOTE — Consult Note (Addendum)
[place] A. Merlene Laughter, MD     [email or web address]          Wendy Reynolds is an 74 y.o. female.   ASSESSMENT/PLAN:  1. Acute multifactorial metabolic encephalopathy due to pneumonia and urinary tract infection. This has improved and is she is at baseline. 2. Acute gait impairment also due to metabolic derangements/acute infections. I suspect that this should continue to improve and she should return to baseline. Physical and occupational therapy suggested. 3. Baseline Parkinson's disease well controlled on medications. We will continue with both medications. Sinemet will be restarted - not on right now???? There appears to be issues with compliance due to her memory problem. Pill planner and other measures are suggested to help with compliance. 4. Baseline mild cognitive impairment/not impairment but not meeting criteria for dementia. Dementia labs will be obtained. 5. Mild baseline gait impairment due to Parkinson disease. 6. Mild dysarthria due to Parkinson disease.  The patient is a 74 year old white female who has a baseline history of tremors due to Parkinson disease. She also has developed some recent issues with mild hypophonia and mild gait instability Parkinson disease. The patient's medications were adjusted. She had been maintained on amantadine for several months but with a worsening symptoms Sinemet was started. She responded very well to this medication. She was maintained on these 2 medications for the last few weeks. Unfortunately, a few days ago she developed the relatively acute/subacute onset of worsening gait with a few falls and the altered mental status. She was told with the emergency room and was diagnosed with a UTI. The symptoms persisted and actually gotten worse and she has been admitted for further evaluation and treatment. The patient has developed rib fractures on the left due to her fall. She complained of significant left thoracic pain and back  pain. The patient is aware of her falling and the confusion but thinks that she has improved. Particular she reports improvement and cognition. She still has difficulties getting around however. The patient has had some memory impairment. We did order labs to check for treatable causes of memory impairment. It appears that this was not done. This is ordered about a month ago at our office. The patient reports that the memory impairment seemed to affect her medication compliance. She tends to miss the middle dose of her medications. 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 She has a daughter that stays with her but the daughter has a spinal cord injury. The review of systems otherwise negative.  GENERAL: Pleasant female in no acute distress. She immediately recognizes me.  HEENT: Supple. Atraumatic normocephalic.   ABDOMEN: soft  EXTREMITIES: No edema   BACK: Severe pain palpation left thoracic region.  SKIN: Normal by inspection.    MENTAL STATUS: Alert and oriented. Speech, language and cognition are generally intact. Judgment and insight normal.   CRANIAL NERVES: Pupils are equal, round and reactive to light and accommodation; extra ocular movements are full, there is no significant nystagmus; visual fields are full; upper and lower facial muscles are normal in strength and symmetric, there is no flattening of the nasolabial folds; tongue is midline; uvula is midline; shoulder elevation is normal.  MOTOR: Normal tone, bulk and strength; no pronator drift.  COORDINATION: Left finger to nose is normal, right finger to nose shows mild dysmetria times. She does have intermittent rests tremor more in the right side which is typical for the patient. There is mild impairment of finger tapping and  hand dexterity. No cogwheeling is noted.  REFLEXES: Deep tendon reflexes are symmetrical and normal. Babinski reflexes are flexor bilaterally.   SENSATION: Normal to light  touch.  GAIT:      [[[[[[[[[[[[[[[[[[[[[[[[[[[[[[[ MY OFFICE NOTE 09-01-2014 PROBLEM LIST: Weakness   ICD10: M62.81  ICD9: 728.87  Depression   ICD10: F32.9 ICD9: 144  Anxiety   ICD10: F41.9  ICD9: 300.00  Parkinson's disease   ICD10: G20  ICD9: 332.0  Hypertension   ICD10: I10  ICD9: 401.9  Hyperlipidemia ICD10: E78.5  ICD9: 272.4  MEDICATIONS: Levothyroxine (Synthroid):   25 mcg (0.025 mg) tablet  SIG-  1 each   once a day   Inderal (Propranolol):   10 mg (tablet)  SIG-  1 tab(s)  daily    Cymbalta (Duloxetine):   60 mg delayed release capsule  SIG-  1 cap(s)   once a day   Alprazolam (Xanax):   0.5 mg tablet  SIG-  1 each   qhs   Lipitor (Atorvastatin):   10 mg (tablet)  SIG-  0.5 tab(s)   once a day (at bedtime)   Lisinopril (Prinivil, Zestril):   10 mg (tablet)  SIG-  1 each   once a day   "Symmetrel (Amantadine):   100 mg tablet  SIG-  1 each    tid Sinemet (Carbidopa/Levodopa):   25 mg-100 mg tablet  SIG-  1 tab(s)   3 times a day  HISTORY OF PRESENT ILLNESS: Follow-up for: Patient relates that she is taking Sinemet tid and she is doing well with this.  Patient states that she is having some memory loss especially at work.  Patient is also forgetting to take her medication at times.  Patient is otherwise doing about the same.  Wendy Reynolds       Patient relates that she feels a lot better since the medication change. Patient does report that she is starting to forget things and staying cold a lot. Patient reports that her balance is better as well.  TOBACCO HISTORY: Never smoker   ICD10: Z87.898 ICD9: V13.89  ALCOHOL HISTORY: 1-2 glasses of wine nightly  SOCIAL HISTORY: Divorced.  2 children.  ALLERGY:  Penicillin (Pen-Vee K) VK:     Aspirin:  OBJECTIVE DATA: VS: BMI: 19.1.  BP: 144/85.  H: 62.00 in.  P: 72 /min.  W: 104lbs 0oz.    MENTAL STATUS: Alert and oriented. Speech- mild dysarthria but clearly improved; language and cognition are generally intact.  Judgment and insight normal.   HEENT:  Bruise on right jaw.  MOTOR: Tone, bulk and strength are grossly normal.   BACK: Unremarkable     EXTREMITY: Normal. She does have intermittent. No tremors today. She has mild cogwheeling with augmentation on the right. She has great hand closing opening maneuver and hand dexterity. She stands unassisted and has good strides and good arm swing.   Spiral construction was good.  GAIT: Good. She turns 180 in 2 steps. Postural reflexes are good.  IMPRESSION: Parkinson's disease   ICD10: G20  ICD9: 332.0   Dysarthria   ICD10: R47.1  ICD9: 784.5  Gait, abnormal   ICD10: R26.9  ICD9: 781.2  High risk medication monitoring   ICD10: Z79.899  ICD9: V58.69  PLAN: 1.   BLOOD TESTS: Vitamin B12/D, complete blood count with diff, comprehension metabolic profile, methylmalonic acid, and thyroid stimulating hormone.     MEDICATION: "Symmetrel (Amantadine):   100 mg tablet  SIG-  1 each    tid  orally  #90 Tablet(s)   Substitutions Allowed  Refills- 2  Days Supply- 30   Notes-     Sinemet (Carbidopa/Levodopa):   25 mg-100 mg tablet  SIG-  1 tab(s)    tid   Instructions-    #90 Tablet(s)   Substitutions Allowed  Refills- 2  Notes-  FOLLOW UP:3 Months Medical scribe, Smith Robert   Phillips Odor, MD     Diplomate, American Board of Psychiatry & Neurology (Neurology). Diplomate, Tax adviser of Sleep Medicine. Signed electronically on September 04, 2014 11:00]]]]]]]]]]]]]]]]]]]]]]]]]]]]]]]]]]]]]]]]]]]]]]]]]]]]]]]]]  Blood pressure 119/73, pulse 61, temperature 98 F (36.7 C), temperature source Oral, resp. rate 20, height 5' 2"  (1.575 m), weight 47.6 kg (104 lb 15 oz), SpO2 99 %.  Past Medical History  Diagnosis Date  . Panic   . Hypertension   . Depression   . Generalized anxiety disorder 03/04/2012  . Alcohol use   . Diastolic dysfunction 02/28/8002    Grade 1. EF 60%  . Chest pain 03/04/2012    MI ruled out. Likely anxiety related.  .  Hyperlipidemia 03/05/2012  . Parkinson disease     History reviewed. No pertinent past surgical history.  Family History  Problem Relation Age of Onset  . Depression Maternal Grandfather   . Depression Cousin   . Alcohol abuse Cousin   . Depression Daughter   . Alcohol abuse Mother   . Alcohol abuse Father   . Alcohol abuse Maternal Aunt     Social History:  reports that she has never smoked. She has never used smokeless tobacco. She reports that she drinks about 1.8 oz of alcohol per week. She reports that she does not use illicit drugs.  Allergies:  Allergies  Allergen Reactions  . Erythromycin Nausea And Vomiting  . Penicillins Hives    Medications: Prior to Admission medications   Medication Sig Start Date End Date Taking? Authorizing Provider  amantadine (SYMMETREL) 100 MG capsule Take 100 mg by mouth 3 (three) times daily.  04/29/13  Yes Historical Provider, MD  aspirin 81 MG chewable tablet Chew 81 mg by mouth daily.   Yes Historical Provider, MD  atorvastatin (LIPITOR) 10 MG tablet Take 5 mg by mouth at bedtime.   Yes Historical Provider, MD  carbidopa-levodopa (SINEMET IR) 25-100 MG per tablet Take 1 tablet by mouth 3 (three) times daily. 08/24/14  Yes Historical Provider, MD  DULoxetine (CYMBALTA) 60 MG capsule Take 1 capsule (60 mg total) by mouth daily. 06/04/14  Yes Levonne Spiller, MD  levothyroxine (SYNTHROID, LEVOTHROID) 25 MCG tablet Take 25 mcg by mouth daily. 04/23/14  Yes Historical Provider, MD  lisinopril (PRINIVIL,ZESTRIL) 10 MG tablet Take 10 mg by mouth daily. 04/14/14  Yes Historical Provider, MD  Multiple Vitamin (MULTIVITAMIN WITH MINERALS) TABS Take 1 tablet by mouth daily.   Yes Historical Provider, MD  Multiple Vitamins-Minerals (ICAPS) CAPS Take 3 capsules by mouth daily.   Yes Historical Provider, MD  propranolol (INDERAL) 10 MG tablet Take 1 tablet (10 mg total) by mouth 2 (two) times daily. 06/04/14  Yes Levonne Spiller, MD  ALPRAZolam Duanne Moron) 0.5 MG tablet  Take 1 tablet (0.5 mg total) by mouth at bedtime. 06/04/14   Levonne Spiller, MD  cephALEXin (KEFLEX) 500 MG capsule Take 1 capsule (500 mg total) by mouth 3 (three) times daily. Patient not taking: Reported on 09/15/2014 09/14/14   Nat Christen, MD  guaiFENesin (MUCINEX) 600 MG 12 hr tablet Take 600 mg by mouth 2 (two) times daily as needed for  cough or to loosen phlegm.    Historical Provider, MD  HYDROcodone-acetaminophen (NORCO) 5-325 MG per tablet Take 1 tablet by mouth every 4 (four) hours as needed. Patient not taking: Reported on 09/14/2014 05/01/14   Mariea Clonts, MD    Scheduled Meds: . ALPRAZolam  0.5 mg Oral QHS  . amantadine  100 mg Oral TID  . aspirin  81 mg Oral Daily  . atorvastatin  5 mg Oral QHS  . cefTRIAXone (ROCEPHIN)  IV  1 g Intravenous Q24H  . DULoxetine  60 mg Oral Daily  . enoxaparin (LOVENOX) injection  30 mg Subcutaneous Q24H  . levothyroxine  25 mcg Oral QAC breakfast  . propranolol  10 mg Oral BID   Continuous Infusions:  PRN Meds:.acetaminophen **OR** acetaminophen, guaiFENesin, HYDROcodone-acetaminophen, ondansetron **OR** ondansetron (ZOFRAN) IV     Results for orders placed or performed during the hospital encounter of 09/14/14 (from the past 48 hour(s))  Ethanol     Status: None   Collection Time: 09/14/14 11:01 PM  Result Value Ref Range   Alcohol, Ethyl (B) <5 0 - 9 mg/dL    Comment:        LOWEST DETECTABLE LIMIT FOR SERUM ALCOHOL IS 11 mg/dL FOR MEDICAL PURPOSES ONLY   CBC     Status: None   Collection Time: 09/15/14  5:35 AM  Result Value Ref Range   WBC 7.7 4.0 - 10.5 K/uL   RBC 3.96 3.87 - 5.11 MIL/uL   Hemoglobin 12.3 12.0 - 15.0 g/dL   HCT 37.5 36.0 - 46.0 %   MCV 94.7 78.0 - 100.0 fL   MCH 31.1 26.0 - 34.0 pg   MCHC 32.8 30.0 - 36.0 g/dL   RDW 13.3 11.5 - 15.5 %   Platelets 214 150 - 400 K/uL  Comprehensive metabolic panel     Status: Abnormal   Collection Time: 09/15/14  5:35 AM  Result Value Ref Range   Sodium 137 135 - 145  mmol/L   Potassium 5.1 3.5 - 5.1 mmol/L   Chloride 103 96 - 112 mmol/L   CO2 21 19 - 32 mmol/L   Glucose, Bld 57 (L) 70 - 99 mg/dL   BUN 48 (H) 6 - 23 mg/dL   Creatinine, Ser 2.20 (H) 0.50 - 1.10 mg/dL   Calcium 9.2 8.4 - 10.5 mg/dL   Total Protein 6.7 6.0 - 8.3 g/dL   Albumin 4.0 3.5 - 5.2 g/dL   AST 40 (H) 0 - 37 U/L   ALT 21 0 - 35 U/L   Alkaline Phosphatase 55 39 - 117 U/L   Total Bilirubin 1.3 (H) 0.3 - 1.2 mg/dL   GFR calc non Af Amer 21 (L) >90 mL/min   GFR calc Af Amer 24 (L) >90 mL/min    Comment: (NOTE) The eGFR has been calculated using the CKD EPI equation. This calculation has not been validated in all clinical situations. eGFR's persistently <90 mL/min signify possible Chronic Kidney Disease.    Anion gap 13 5 - 15  Glucose, capillary     Status: Abnormal   Collection Time: 09/15/14  7:44 AM  Result Value Ref Range   Glucose-Capillary 56 (L) 70 - 99 mg/dL   Comment 1 Notify RN   Glucose, capillary     Status: Abnormal   Collection Time: 09/15/14  8:22 AM  Result Value Ref Range   Glucose-Capillary 111 (H) 70 - 99 mg/dL  Urine rapid drug screen (hosp performed)     Status:  Abnormal   Collection Time: 09/15/14  6:31 PM  Result Value Ref Range   Opiates POSITIVE (A) NONE DETECTED   Cocaine NONE DETECTED NONE DETECTED   Benzodiazepines POSITIVE (A) NONE DETECTED   Amphetamines NONE DETECTED NONE DETECTED   Tetrahydrocannabinol NONE DETECTED NONE DETECTED   Barbiturates NONE DETECTED NONE DETECTED    Comment:        DRUG SCREEN FOR MEDICAL PURPOSES ONLY.  IF CONFIRMATION IS NEEDED FOR ANY PURPOSE, NOTIFY LAB WITHIN 5 DAYS.        LOWEST DETECTABLE LIMITS FOR URINE DRUG SCREEN Drug Class       Cutoff (ng/mL) Amphetamine      1000 Barbiturate      200 Benzodiazepine   601 Tricyclics       093 Opiates          300 Cocaine          300 THC              50   Comprehensive metabolic panel     Status: Abnormal   Collection Time: 09/16/14  5:57 AM  Result  Value Ref Range   Sodium 140 135 - 145 mmol/L   Potassium 4.4 3.5 - 5.1 mmol/L   Chloride 110 96 - 112 mmol/L   CO2 23 19 - 32 mmol/L   Glucose, Bld 92 70 - 99 mg/dL   BUN 37 (H) 6 - 23 mg/dL   Creatinine, Ser 1.20 (H) 0.50 - 1.10 mg/dL    Comment: DELTA CHECK NOTED   Calcium 8.6 8.4 - 10.5 mg/dL   Total Protein 5.9 (L) 6.0 - 8.3 g/dL   Albumin 3.4 (L) 3.5 - 5.2 g/dL   AST 34 0 - 37 U/L   ALT 22 0 - 35 U/L   Alkaline Phosphatase 48 39 - 117 U/L   Total Bilirubin 0.6 0.3 - 1.2 mg/dL   GFR calc non Af Amer 43 (L) >90 mL/min   GFR calc Af Amer 50 (L) >90 mL/min    Comment: (NOTE) The eGFR has been calculated using the CKD EPI equation. This calculation has not been validated in all clinical situations. eGFR's persistently <90 mL/min signify possible Chronic Kidney Disease.    Anion gap 7 5 - 15  TSH     Status: None   Collection Time: 09/16/14  5:57 AM  Result Value Ref Range   TSH 4.121 0.350 - 4.500 uIU/mL    Studies/Results:  HEAD CT Visualized paranasal sinuses and mastoids are clear. Osteopenia. No acute osseous abnormality identified. Negative visualized orbit and scalp soft tissues.  Calcified atherosclerosis at the skull base. Cerebral volume is within normal limits for age. No midline shift, ventriculomegaly, mass effect, evidence of mass lesion, intracranial hemorrhage or evidence of cortically based acute infarction. Gray-white matter differentiation is within normal limits throughout the brain. No suspicious intracranial vascular hyperdensity.  IMPRESSION: Normal for age non contrast CT appearance of the brain.  HEAD CT reviewed in person and is normal for age.    XRAY T-SPINE NORMAL    XRAY L-SPINE  Slight superior endplate impaction at L4, possibly an acute minimal compression.     Wendy Reynolds A. Merlene Laughter, M.D.  Diplomate, Tax adviser of Psychiatry and Neurology ( Neurology). 09/16/2014, 7:10 PM

## 2014-09-16 NOTE — Clinical Social Work Placement (Signed)
Clinical Social Work Department CLINICAL SOCIAL WORK PLACEMENT NOTE 09/16/2014  Patient:  Wendy Reynolds,Wendy Reynolds  Account Number:  [id number] Admit date:  09/14/2014  Clinical Social Worker:  Derenda FennelKARA Sandip Power, LCSW Date/time:  09/16/2014 03:22 PM  Clinical Social Work is seeking post-discharge placement for this patient at the following level of care:   SKILLED NURSING   (*CSW will update this form in Epic as items are completed)   09/16/2014  Patient/family provided with [facility] Department of Clinical Social Work's list of facilities offering this level of care within the geographic area requested by the patient (or if unable, by the patient's family).  09/16/2014  Patient/family informed of their freedom to choose among providers that offer the needed level of care, that participate in Medicare, Medicaid or managed care program needed by the patient, have an available bed and are willing to accept the patient.  09/16/2014  Patient/family informed of [facility]' ownership interest in [facility], as well as of the fact that they are under no obligation to receive care at this facility.  PASARR submitted to EDS on 09/16/2014 PASARR number received on 09/16/2014  FL2 transmitted to all facilities in geographic area requested by pt/family on  09/16/2014 FL2 transmitted to all facilities within larger geographic area on   Patient informed that his/her managed care company has contracts with or will negotiate with  certain facilities, including the following:     Patient/family informed of bed offers received:   Patient chooses bed at  Physician recommends and patient chooses bed at    Patient to be transferred to  on   Patient to be transferred to facility by  Patient and family notified of transfer on  Name of family member notified:    The following physician request were entered in Epic:   Additional Comments:  Derenda FennelKara Lurena Naeve, LCSW [phone]

## 2014-09-16 NOTE — Progress Notes (Signed)
PROGRESS NOTE  Wendy Reynolds GEX:[id number] DOB: 03/24/1941 DOA: 09/14/2014 PCP: Cassell SmilesFUSCO,LAWRENCE J., MD  Summary: 74 year old woman with multiple medical problems including Parkinson's disease who presented with history of multiple falls at home, diagnosed earlier in the day with UTI and discharged from emergency department but came back for further falls. Known to be alert and oriented in the emergency department, no recent medication changes. Admitted for UTI, acute kidney injury.  Assessment/Plan: 1. UTI. Improving.  2. Acute encephalopathy. Resolved. Suspect UTI, became by alcohol. CT head negative. 3. Multiple falls at home. Skilled nursing facility recommended for short-term rehabilitation. Neurology consultation pending given history of Parkinson's. 4. Left lateral rib fracture with minimal displacement. No evidence of complicating features. Secondary to fall at home. 5. Acute kidney injury. Nearly resolved, renal ultrasound unremarkable. 6. Orthostatic hypotension resolved. 7. Hypoglycemia. Resolved. No history of diabetes. Patient reports she has been diagnosed with this but "it doesn't bother me very much". 8. Back pain. Resolved. X-ray hips, pelvis negative. Possible acute L4 minimal compression fracture, asymptomatic. Thoracic plain films unremarkable. 9. Elevated AST, minimally elevated. Resolved, likely alcohol induced. 10. Hypothyroidism. TSH within normal limits 11. Parkinson's disease, neurology recommendations pending 12. Depression, generalized anxiety disorder 13. Alcohol use, possible abuse--drinks 2-3 glasses wine "up to half a bottle" a day. Serum alcohol level was negative in the emergency department. No evidence of withdrawal.   Continue empiric antibiotics, follow-up urine culture.  Recommend weaning Xanax as outpatient.  Plan discharge to skilled nursing facility 2/19  Code Status: DNR DVT prophylaxis: Lovenox Family Communication: none present Disposition  Plan:   Brendia Sacksaniel Laquida Cotrell, MD  [facility]  Pager [phone] If 7PM-7AM, please contact night-coverage at [email or web address], password [email or web address] 09/16/2014, 4:09 PM  LOS: 1 day   Consultants:  PT: SNF  Procedures:    Antibiotics:    HPI/Subjective: Patient got up without assistance earlier and sustained a fall onto her right knee. No complaint of pain. Physical therapy now recommends skilled nursing facility.  Doing well, no knee pain. No complaints. Eating well.  Objective: Filed Vitals:   09/15/14 0553 09/15/14 2100 09/16/14 0500 09/16/14 1144  BP: 110/76 98/56 116/69 119/73  Pulse: 66 69 62 61  Temp: 98.2 F (36.8 C) 98.3 F (36.8 C) 98.2 F (36.8 C) 98 F (36.7 C)  TempSrc: Oral Oral Oral Oral  Resp: 20 20 20 20   Height:      Weight:      SpO2: 100% 99% 98% 99%    Intake/Output Summary (Last 24 hours) at 09/16/14 1609 Last data filed at 09/16/14 1448  Gross per 24 hour  Intake 1362.5 ml  Output   1300 ml  Net   62.5 ml     [facility]   09/14/14 2107 09/15/14 0331  Weight: 47.628 kg (105 lb) 47.6 kg (104 lb 15 oz)    Exam:     Afebrile, VSS General:  Appears comfortable, calm. Cardiovascular: Regular rate and rhythm, no murmur, rub or gallop. No lower extremity edema. Respiratory: Clear to auscultation bilaterally, no wheezes, rales or rhonchi. Normal respiratory effort. Left knee with some old bruising no pain, moves well, no edema Psychiatric: grossly normal mood and affect, speech fluent and appropriate  Data Reviewed:  Renal function much improved. TSH normal.  Pertinent data:  Labs   Imaging   Renal ultrasound unremarkable.  Rib films demonstrate acute minimally displaced posterior left lateral rib fracture Other  EKG sinus rhythm, no acute changes  Pending data:  Urine culture  Scheduled Meds: . ALPRAZolam  0.5 mg Oral QHS  . amantadine  100 mg Oral TID  . aspirin  81 mg Oral Daily  . atorvastatin  5 mg Oral QHS  .  cefTRIAXone (ROCEPHIN)  IV  1 g Intravenous Q24H  . DULoxetine  60 mg Oral Daily  . enoxaparin (LOVENOX) injection  30 mg Subcutaneous Q24H  . levothyroxine  25 mcg Oral QAC breakfast  . propranolol  10 mg Oral BID   Continuous Infusions: . sodium chloride 75 mL/hr at 09/16/14 0416    Principal Problem:   UTI (lower urinary tract infection) Active Problems:   Parkinson's disease (tremor, stiffness, slow motion, unstable posture)   Acute kidney injury   Acute encephalopathy   Orthostatic hypotension   Hypoglycemia   Frequent falls   Time spent 15 minutes

## 2014-09-16 NOTE — Care Management Note (Addendum)
    Page 1 of 2   09/17/2014     11:22:00 AM CARE MANAGEMENT NOTE 09/17/2014  Patient:  Claretta FraiseLLEN,Isa H   Account Number:  [id number]  Date Initiated:  09/16/2014  Documentation initiated by:  Sharrie RothmanBLACKWELL,Bennett Vanscyoc C  Subjective/Objective Assessment:   Pt admitted from home with UTI and weakness. Pt lives with her handicap daughter who is in a wheelchair. Pt stated that she does for herself and her daughter does for herself. Cm explained that pts daughter has concerns about pts safety     Action/Plan:   and being home. Pt stated that she and her daughter "lock horns" all the time about this issue. PT recommends HH PT and a rolling walker and pt refuses both as she is planning on returning to work ASAP. Pt is aware that if she changes her   Anticipated DC Date:  09/17/2014   Anticipated DC Plan:  HOME/SELF CARE      DC Planning Services  CM consult      PAC Choice  DURABLE MEDICAL EQUIPMENT  HOME HEALTH   Choice offered to / List presented to:  C-1 Patient   DME arranged  WALKER - Lavone NianOLLING      [facility]     HH arranged  [facility]      [facility]   Status of service:  Completed, signed off Medicare Important Message given?  YES (If response is "NO", the following Medicare IM given date fields will be blank) Date Medicare IM given:  09/17/2014 Medicare IM given by:  Sharrie RothmanBLACKWELL,Maye Parkinson C Date Additional Medicare IM given:   Additional Medicare IM given by:    Discharge Disposition:  SKILLED NURSING FACILITY  Per UR Regulation:    If discussed at Long Length of Stay Meetings, dates discussed:    Comments:  09/17/14 1120 Arlyss Queenammy Jahmil Macleod, RN BSN CM [facility]. CSW to arrange discharge to facility.   09/16/14 1515 Arlyss Queenammy Bayler Gehrig, RN BSN CM Pt fell today while trying to get OOB. PT reassessed pt and determined that pt is SNF appropriate. CSW is aware and will start SNF bed search. No other Cm needs noted.  09/16/14  1120 Arlyss Queenammy Brainard Highfill, RN BSN CM Pt and daughter has decided that they would like HH PT and rolling walker after all. Pt choses [facility] for PT. Alroy BailiffLinda Lothian [facility] and will collect pts information from the chart. HH services to start within 48 hours of discharge. Pts walker ordered from [facility] and will be delivered to pts room prior to discharge. Pt and pts nurse aware of discharge arrangements.  08/1814 1100 Arlyss Queenammy Sie Formisano, RN BSN CM mind to alert the CM and Cm would be glad to [facility] and rolling walker.

## 2014-09-17 ENCOUNTER — Inpatient Hospital Stay
Admission: RE | Admit: 2014-09-17 | Discharge: 2014-10-12 | Disposition: A | Discharge status: Home / Self Care | Payer: Medicare HMO | Source: Ambulatory Visit | Attending: Internal Medicine | Admitting: Internal Medicine

## 2014-09-17 DIAGNOSIS — E785 Hyperlipidemia, unspecified: Secondary | ICD-10-CM | POA: Diagnosis not present

## 2014-09-17 DIAGNOSIS — E782 Mixed hyperlipidemia: Secondary | ICD-10-CM | POA: Diagnosis not present

## 2014-09-17 DIAGNOSIS — N390 Urinary tract infection, site not specified: Secondary | ICD-10-CM | POA: Diagnosis not present

## 2014-09-17 DIAGNOSIS — F1010 Alcohol abuse, uncomplicated: Secondary | ICD-10-CM | POA: Diagnosis not present

## 2014-09-17 DIAGNOSIS — I951 Orthostatic hypotension: Secondary | ICD-10-CM | POA: Diagnosis not present

## 2014-09-17 DIAGNOSIS — G9340 Encephalopathy, unspecified: Secondary | ICD-10-CM | POA: Diagnosis not present

## 2014-09-17 DIAGNOSIS — F410 Panic disorder [episodic paroxysmal anxiety] without agoraphobia: Secondary | ICD-10-CM | POA: Diagnosis not present

## 2014-09-17 DIAGNOSIS — N178 Other acute kidney failure: Secondary | ICD-10-CM | POA: Diagnosis not present

## 2014-09-17 DIAGNOSIS — F411 Generalized anxiety disorder: Secondary | ICD-10-CM | POA: Diagnosis not present

## 2014-09-17 DIAGNOSIS — F329 Major depressive disorder, single episode, unspecified: Secondary | ICD-10-CM | POA: Diagnosis not present

## 2014-09-17 DIAGNOSIS — R296 Repeated falls: Secondary | ICD-10-CM | POA: Diagnosis not present

## 2014-09-17 DIAGNOSIS — N289 Disorder of kidney and ureter, unspecified: Secondary | ICD-10-CM | POA: Diagnosis not present

## 2014-09-17 DIAGNOSIS — G20 Parkinson's disease: Secondary | ICD-10-CM | POA: Diagnosis not present

## 2014-09-17 DIAGNOSIS — I10 Essential (primary) hypertension: Secondary | ICD-10-CM | POA: Diagnosis not present

## 2014-09-17 DIAGNOSIS — E039 Hypothyroidism, unspecified: Secondary | ICD-10-CM | POA: Diagnosis not present

## 2014-09-17 DIAGNOSIS — N179 Acute kidney failure, unspecified: Secondary | ICD-10-CM | POA: Diagnosis not present

## 2014-09-17 LAB — VITAMIN B12: Vitamin B-12: 371 pg/mL (ref 211–911)

## 2014-09-17 MED ORDER — HYDROCODONE-ACETAMINOPHEN 5-325 MG PO TABS: 1.0000 | ORAL_TABLET | Freq: Four times a day (QID) | ORAL | Status: DC | PRN

## 2014-09-17 MED ORDER — CARBIDOPA-LEVODOPA 25-100 MG PO TABS
1.0000 | ORAL_TABLET | Freq: Three times a day (TID) | ORAL | Status: DC
  Administered 2014-09-17: 1 via ORAL
  Filled 2014-09-17: qty 1

## 2014-09-17 MED ORDER — ALPRAZOLAM 0.5 MG PO TABS: 0.5000 mg | ORAL_TABLET | Freq: Every day | ORAL | Status: DC

## 2014-09-17 MED ORDER — CIPROFLOXACIN HCL 250 MG PO TABS
500.0000 mg | ORAL_TABLET | Freq: Two times a day (BID) | ORAL | Status: DC
  Administered 2014-09-17: 500 mg via ORAL
  Filled 2014-09-17: qty 2

## 2014-09-17 MED ORDER — CIPROFLOXACIN HCL 500 MG PO TABS: 500.0000 mg | ORAL_TABLET | Freq: Two times a day (BID) | ORAL | Status: DC

## 2014-09-17 NOTE — Clinical Social Work Note (Signed)
Pt d/c today to SNF. CSW presented [facility] aware. Pt will transfer with RN. CSW offered to call daughter, but pt states she would prefer to call her regarding d/c.   Derenda FennelKara Keith Felten, LCSW [phone]

## 2014-09-17 NOTE — Clinical Social Work Placement (Signed)
Clinical Social Work Department CLINICAL SOCIAL WORK PLACEMENT NOTE 09/17/2014  Patient:  Wendy Reynolds,Wendy Reynolds  Account Number:  [id number] Admit date:  09/14/2014  Clinical Social Worker:  Derenda FennelKARA Kartier Bennison, LCSW  Date/time:  09/16/2014 03:22 PM  Clinical Social Work is seeking post-discharge placement for this patient at the following level of care:   SKILLED NURSING   (*CSW will update this form in Epic as items are completed)   09/16/2014  Patient/family provided with [facility] Department of Clinical Social Work's list of facilities offering this level of care within the geographic area requested by the patient (or if unable, by the patient's family).  09/16/2014  Patient/family informed of their freedom to choose among providers that offer the needed level of care, that participate in Medicare, Medicaid or managed care program needed by the patient, have an available bed and are willing to accept the patient.  09/16/2014  Patient/family informed of [facility]' ownership interest in [facility], as well as of the fact that they are under no obligation to receive care at this facility.  PASARR submitted to EDS on 09/16/2014 PASARR number received on 09/16/2014  FL2 transmitted to all facilities in geographic area requested by pt/family on  09/16/2014 FL2 transmitted to all facilities within larger geographic area on   Patient informed that his/her managed care company has contracts with or will negotiate with  certain facilities, including the following:     Patient/family informed of bed offers received:  09/17/2014 [facility] Physician recommends and patient chooses bed at    [facility]  09/17/2014 Patient to be transferred to facility by RN Patient and family notified of transfer on 09/17/2014 Name of family member notified:  pt refuses  The following physician request were entered in  Epic:   Additional Comments:  Derenda FennelKara Lineth Thielke, LCSW [phone]

## 2014-09-17 NOTE — Discharge Summary (Signed)
Physician Discharge Summary  Claretta Fraiseamela H Gwynne WGN:[id number]:[id number] DOB: 05/03/1941 DOA: 09/14/2014  PCP: Cassell SmilesFUSCO,LAWRENCE J., MD  Admit date: 09/14/2014 Discharge date: 09/17/2014  Recommendations for Outpatient Follow-up:  1. Acute kidney impairment, multiple falls at home, short-term rehabilitation, condition complicated by Parkinson's disease. 2. Left lateral rib fracture without evidence of complicating features 3. Suspected alcohol abuse, no evidence of withdrawal  4. Consider weaning Xanax as an outpatient given her history of falls 5. Continue current medications for Parkinson's disease per neurology. Suggest pill planner and [facility] after discharge from SNF to help with medication compliance.   Follow-up Information    Follow up with Cassell SmilesFUSCO,LAWRENCE J., MD In 2 weeks.   Specialty:  Internal Medicine   Contact information:   [address] [place] [address] [phone]       Follow up with [facility] Corporation[facility]ONQUAH, Darleen CrockerKOFI, MD In 1 month.   Specialty:  Neurology   Contact information:   [address] [place] [address] [phone]      Discharge Diagnoses:  1. Enterococcus UTI 2. Acute encephalopathy 3. Acute kidney injury 4. Acute gait impairment with multiple falls at home 5. Left lateral rib fracture with minimal displacement secondary to fall prior to admission 6. Orthostatic hypotension 7. Elevated AST, elevated total bilirubin, resolved 8. Parkinson's disease 9. Suspected alcohol abuse  Discharge Condition: Improved Disposition: Transfer to skilled nursing facility  Diet recommendation: Regular diet  Filed Weights   09/14/14 2107 09/15/14 0331  Weight: 47.628 kg (105 lb) 47.6 kg (104 lb 15 oz)    History of present illness:  74 year old woman with multiple medical problems including Parkinson's disease who presented with history of multiple falls at home, diagnosed earlier in the day with UTI and discharged from emergency department but came back for further falls.  Known to be alert and oriented in the emergency department, no recent medication changes. Admitted for UTI, acute kidney injury.  Hospital Course:  Ms. Freida Busmanllen was treated with empiric antibiotics and IV fluids. Acute kidney injury resolved with IV fluids and temporary cessation of lisinopril. Likely reactive related to acute illness and UTI. UTI clinically resolved, antibiotics were changed based on culture data. Encephalopathy resolved and was likely related to infection complicated by alcohol use. Incidental finding of lateral left rib fracture without evidence of complicating features. Other issues as below. Her hospitalization was uncomplicated. She was evaluated by physical therapy with short-term skilled nursing facility placement recommended for short-term rehabilitation.  1. Enterococcus UTI. Change to Cipro [facility] 2. Acute encephalopathy. Resolved. Suspect UTI, likely complicated by alcohol. CT head negative. No evidence of pneumonia. 3. Multiple falls at home, acute gait impairment. Thought secondary to acute illness. Skilled nursing facility recommended for short-term rehabilitation. Neurology recommended continuing current meds for Parkinson's disease. 4. Left lateral rib fracture with minimal displacement. No evidence of complicating features. Secondary to fall at home. No hypoxia or tachypnea. Supportive care recommended. 5. Acute kidney injury. Clinically resolved, renal ultrasound unremarkable. 6. Orthostatic hypotension resolved. 7. Hypoglycemia. Resolved. No history of diabetes. Patient reports she has been diagnosed with this but "it doesn't bother me very much". 8. Back pain. Resolved. X-ray hips, pelvis negative. Possible acute L4 minimal compression fracture, asymptomatic. Thoracic plain films unremarkable. 9. Elevated AST, minimally elevated. Resolved, likely alcohol induced. 10. Hypothyroidism. TSH within normal limits 11. Parkinson's disease, neurology recommendations  noted 12. Depression, generalized anxiety disorder. Stable.  13. Alcohol use, possible abuse--drinks 2-3 glasses wine "up to half a bottle" a day. Serum alcohol level  was negative in the emergency department. No evidence of withdrawal.  Consultants:  Neurology  PT: SNF  Procedures:  none  Antibiotics: Cipro 2/19 >> 2/21 Ceftriaxone 2/16 >> 2/18  Discharge Instructions   Current Discharge Medication List    START taking these medications   Details  ciprofloxacin (CIPRO) 500 MG tablet Take 1 tablet (500 mg total) by mouth 2 (two) times daily. Last dose 2/21 in the evening.      CONTINUE these medications which have CHANGED   Details  ALPRAZolam (XANAX) 0.5 MG tablet Take 1 tablet (0.5 mg total) by mouth at bedtime. Qty: 10 tablet, Refills: 0    HYDROcodone-acetaminophen (NORCO) 5-325 MG per tablet Take 1 tablet by mouth every 6 (six) hours as needed for severe pain. Qty: 10 tablet, Refills: 0      CONTINUE these medications which have NOT CHANGED   Details  amantadine (SYMMETREL) 100 MG capsule Take 100 mg by mouth 3 (three) times daily.     aspirin 81 MG chewable tablet Chew 81 mg by mouth daily.    atorvastatin (LIPITOR) 10 MG tablet Take 5 mg by mouth at bedtime.    carbidopa-levodopa (SINEMET IR) 25-100 MG per tablet Take 1 tablet by mouth 3 (three) times daily.    DULoxetine (CYMBALTA) 60 MG capsule Take 1 capsule (60 mg total) by mouth daily. Qty: 30 capsule, Refills: 2    levothyroxine (SYNTHROID, LEVOTHROID) 25 MCG tablet Take 25 mcg by mouth daily.    lisinopril (PRINIVIL,ZESTRIL) 10 MG tablet Take 10 mg by mouth daily.    Multiple Vitamins-Minerals (ICAPS) CAPS Take 3 capsules by mouth daily.    propranolol (INDERAL) 10 MG tablet Take 1 tablet (10 mg total) by mouth 2 (two) times daily. Qty: 60 tablet, Refills: 3      STOP taking these medications     Multiple Vitamin (MULTIVITAMIN WITH MINERALS) TABS      cephALEXin (KEFLEX) 500 MG capsule        guaiFENesin (MUCINEX) 600 MG 12 hr tablet        Allergies  Allergen Reactions  . Erythromycin Nausea And Vomiting  . Penicillins Hives    The results of significant diagnostics from this hospitalization (including imaging, microbiology, ancillary and laboratory) are listed below for reference.    Significant Diagnostic Studies: Dg Ribs Unilateral Left  09/15/2014   CLINICAL DATA:  Fall Saturday morning with left lower rib pain posteriorly.  EXAM: LEFT RIBS - 2 VIEW  COMPARISON:  Chest x-ray 03/04/2012  FINDINGS: Examination demonstrates a minimally displaced acute posterior lateral left ninth rib fracture. No evidence pneumothorax. Remainder the exam is unchanged.  IMPRESSION: Acute minimally displaced posterior lateral left ninth rib fracture.   Electronically Signed   By: Elberta Fortis M.D.   On: 09/15/2014 14:21   Dg Thoracic Spine W/swimmers  09/15/2014   CLINICAL DATA:  Status post fall today.  Thoracic spine pain.  EXAM: THORACIC SPINE - 2 VIEW + SWIMMERS  COMPARISON:  PA and lateral chest 03/04/2012.  FINDINGS: There is no fracture or malalignment. Calcified breast implants are again seen, unchanged  IMPRESSION: No acute abnormality.   Electronically Signed   By: Drusilla Kanner M.D.   On: 09/15/2014 01:03   Dg Lumbar Spine 2-3 Views  09/15/2014   CLINICAL DATA:  Larey Seat today well in kitchen, striking a counter top.  EXAM: LUMBAR SPINE - 2-3 VIEW  COMPARISON:  None.  FINDINGS: There is slight superior endplate impaction at L4, indeterminate age. The  other lumbar vertebrae are normal in height. There is slight right convex curvature centered at L2.  IMPRESSION: Slight superior endplate impaction at L4, possibly an acute minimal compression.   Electronically Signed   By: Ellery Plunk M.D.   On: 09/15/2014 01:04   Ct Head Wo Contrast  09/14/2014   CLINICAL DATA:  74 year old female with altered mental status, chills and tremors crit 36 hours. Fall today. Initial encounter.   EXAM: CT HEAD WITHOUT CONTRAST  TECHNIQUE: Contiguous axial images were obtained from the base of the skull through the vertex without intravenous contrast.  COMPARISON:  None.  FINDINGS: Visualized paranasal sinuses and mastoids are clear. Osteopenia. No acute osseous abnormality identified. Negative visualized orbit and scalp soft tissues.  Calcified atherosclerosis at the skull base. Cerebral volume is within normal limits for age. No midline shift, ventriculomegaly, mass effect, evidence of mass lesion, intracranial hemorrhage or evidence of cortically based acute infarction. Gray-white matter differentiation is within normal limits throughout the brain. No suspicious intracranial vascular hyperdensity.  IMPRESSION: Normal for age non contrast CT appearance of the brain.   Electronically Signed   By: Odessa Fleming M.D.   On: 09/14/2014 14:43   US Renal  09/15/2014   CLINICAL DATA:  Acute renal failure  EXAM: RENAL/URINARY TRACT ULTRASOUND COMPLETE  COMPARISON:  None.  FINDINGS: Right Kidney:  Length: 9.0 cm. Echogenicity within normal limits. No mass or hydronephrosis visualized.  Left Kidney:  Length: 9.2 cm. Echogenicity within normal limits. No mass or hydronephrosis visualized.  Bladder:  Appears normal for degree of bladder distention. Both ureteral jets visualized.  IMPRESSION: 1. Normal renal sonogram. No findings to explain acute renal failure.   Electronically Signed   By: Signa Kell M.D.   On: 09/15/2014 13:31   Dg Hip Unilat With Pelvis 2-3 Views Left  09/14/2014   CLINICAL DATA:  74 year old female with multiple falls today. Greater trochanter region pain. Initial encounter.  EXAM: LEFT HIP (WITH PELVIS) 2-3 VIEWS  COMPARISON:  None.  FINDINGS: Bone mineralization is within normal limits for age. Femoral heads are normally located. Hip joint spaces are symmetric. Pelvis intact. sacral ala and SI joints appear within normal limits. Proximal right femur appears grossly intact. Proximal left femur  intact.  IMPRESSION: No acute fracture or dislocation identified about the left upper pelvis.  If occult hip fracture is suspected or if the patient is unable to weightbear, MRI is the preferred modality for further evaluation.   Electronically Signed   By: Odessa Fleming M.D.   On: 09/14/2014 13:18    Microbiology: Recent Results (from the past 240 hour(s))  Urine culture     Status: None   Collection Time: 09/14/14  4:54 PM  Result Value Ref Range Status   Specimen Description URINE, CLEAN CATCH  Final   Special Requests IMMUNE:COMPROMISED  Final   Colony Count   Final    >=100,000 COLONIES/ML Performed at [facility]    Culture   Final    ENTEROCOCCUS SPECIES Performed at [facility]    Report Status 09/16/2014 FINAL  Final   Organism ID, Bacteria ENTEROCOCCUS SPECIES  Final      Susceptibility   Enterococcus species - MIC*    AMPICILLIN <=2 SENSITIVE Sensitive     LEVOFLOXACIN 1 SENSITIVE Sensitive     NITROFURANTOIN <=16 SENSITIVE Sensitive     VANCOMYCIN <=0.5 SENSITIVE Sensitive     TETRACYCLINE >=16 RESISTANT Resistant     * ENTEROCOCCUS SPECIES  Labs: Basic Metabolic Panel:  Recent Labs Lab 09/14/14 1203 09/15/14 0535 09/16/14 0557  NA 136 137 140  K 5.0 5.1 4.4  CL 103 103 110  CO2 GLUCOSE 92 57* 92  BUN 36* 48* 37*  CREATININE 2.25* 2.20* 1.20*  CALCIUM 9.5 9.2 8.6   Liver Function Tests:  Recent Labs Lab 09/14/14 1203 09/15/14 0535 09/16/14 0557  AST 41* 40* 34  ALT ALKPHOS 56 55 48  BILITOT 1.1 1.3* 0.6  PROT 7.3 6.7 5.9*  ALBUMIN 4.7 4.0 3.4*   CBC:  Recent Labs Lab 09/14/14 1203 09/15/14 0535  WBC 8.1 7.7  NEUTROABS 6.0  --   HGB 13.4 12.3  HCT 40.3 37.5  MCV 95.0 94.7  PLT 202 214   Cardiac Enzymes:  Recent Labs Lab 09/14/14 1203  TROPONINI <0.03   CBG:  Recent Labs Lab 09/15/14 0744 09/15/14 0822  GLUCAP 56* 111*    Principal Problem:   UTI (lower urinary tract  infection) Active Problems:   Parkinson's disease (tremor, stiffness, slow motion, unstable posture)   Acute kidney injury   Acute encephalopathy   Orthostatic hypotension   Hypoglycemia   Frequent falls   Time coordinating discharge: 35 minutes  Signed:  Brendia Sacks, MD [facility] 09/17/2014, 11:13 AM

## 2014-09-17 NOTE — Progress Notes (Signed)
PROGRESS NOTE  Wendy Reynolds:[id number] DOB: 12-Aug-1940 DOA: 09/14/2014 PCP: Cassell Smiles., MD  Summary: 74 year old woman with multiple medical problems including Parkinson's disease who presented with history of multiple falls at home, diagnosed earlier in the day with UTI and discharged from emergency department but came back for further falls. Known to be alert and oriented in the emergency department, no recent medication changes. Admitted for UTI, acute kidney injury.  Assessment/Plan: 1. Enterococcus UTI. Change to Cipro [facility] allergic). 2. Acute encephalopathy. Resolved. Suspect UTI, likely complicated by alcohol. CT head negative. No evidence of pneumonia. 3. Multiple falls at home, acute gait impairment. Thought secondary to acute illness. Skilled nursing facility recommended for short-term rehabilitation. Neurology recommended continuing current meds for Parkinson's disease. 4. Left lateral rib fracture with minimal displacement. No evidence of complicating features. Secondary to fall at home. No hypoxia or tachypnea. Supportive care recommended. 5. Acute kidney injury. Clinically resolved, renal ultrasound unremarkable. 6. Orthostatic hypotension resolved. 7. Hypoglycemia. Resolved. No history of diabetes. Patient reports she has been diagnosed with this but "it doesn't bother me very much". 8. Back pain. Resolved. X-ray hips, pelvis negative. Possible acute L4 minimal compression fracture, asymptomatic. Thoracic plain films unremarkable. 9. Elevated AST, minimally elevated. Resolved, likely alcohol induced. 10. Hypothyroidism. TSH within normal limits 11. Parkinson's disease, neurology recommendations noted 12. Depression, generalized anxiety disorder. Stable.  13. Alcohol use, possible abuse--drinks 2-3 glasses wine "up to half a bottle" a day. Serum alcohol level was negative in the emergency department. No evidence of withdrawal.   Overall improving.  Stop Rocephin,  start Cipro based on culture data.  Recommend weaning Xanax as outpatient.  Continue current medications for Parkinson's disease per neurology. Suggest pill planner and [facility] after discharge from SNF to help with medication compliance.   Stable for transfer to SNF today  Code Status: DNR DVT prophylaxis: Lovenox Family Communication: none present Disposition Plan:   Brendia Sacks, MD  [facility]  Pager [phone] If 7PM-7AM, please contact night-coverage at [email or web address], password [facility] 09/17/2014, 10:23 AM  LOS: 2 days   Consultants:  Neurology  PT: SNF  Procedures:    Antibiotics:    HPI/Subjective: Feeling well, no pain "whatsoever" except left rib pain with movement. Eating well. No back, hip or leg pain.  Objective: Filed Vitals:   09/16/14 0500 09/16/14 1144 09/16/14 2217 09/17/14 0622  BP: 116/69 119/73 134/71 110/58  Pulse: 62 61 62 59  Temp: 98.2 F (36.8 C) 98 F (36.7 C) 98 F (36.7 C) 98.2 F (36.8 C)  TempSrc: Oral Oral Oral Oral  Resp: Height:      Weight:      SpO2: 98% 99% 99% 99%    Intake/Output Summary (Last 24 hours) at 09/17/14 1023 Last data filed at 09/17/14 0825  Gross per 24 hour  Intake    100 ml  Output   1050 ml  Net   -950 ml     [facility] Weights   09/14/14 2107 09/15/14 0331  Weight: 47.628 kg (105 lb) 47.6 kg (104 lb 15 oz)    Exam:     Afebrile, vital signs stable. No hypoxia. General:  Appears calm and comfortable Cardiovascular: RRR, no m/r/g. No LE edema. Respiratory: CTA bilaterally, no w/r/r. Normal respiratory effort. Musculoskeletal: grossly normal tone BUE/BLE Psychiatric: grossly normal mood and affect, speech fluent and appropriate Neurologic: grossly non-focal.  Data Reviewed:  No new labs  Pertinent data:  Labs   creatinine 2.25  >>  2.2  >> 1.   CBC unremarkable  TSH normal  Imaging   Renal ultrasound unremarkable.  Rib films demonstrate acute minimally displaced  posterior left lateral rib fracture Other  EKG sinus rhythm, no acute changes  Pending data:  Urine culture  Scheduled Meds: . ALPRAZolam  0.5 mg Oral QHS  . amantadine  100 mg Oral TID  . aspirin  81 mg Oral Daily  . atorvastatin  5 mg Oral QHS  . carbidopa-levodopa  1 tablet Oral TID  . cefTRIAXone (ROCEPHIN)  IV  1 g Intravenous Q24H  . DULoxetine  60 mg Oral Daily  . enoxaparin (LOVENOX) injection  30 mg Subcutaneous Q24H  . levothyroxine  25 mcg Oral QAC breakfast  . propranolol  10 mg Oral BID   Continuous Infusions:    Principal Problem:   UTI (lower urinary tract infection) Active Problems:   Parkinson's disease (tremor, stiffness, slow motion, unstable posture)   Acute kidney injury   Acute encephalopathy   Orthostatic hypotension   Hypoglycemia   Frequent falls

## 2014-09-18 LAB — RPR: RPR: NONREACTIVE

## 2014-09-19 LAB — HOMOCYSTEINE: Homocysteine: 10.1 umol/L (ref 0.0–15.0)

## 2014-09-20 ENCOUNTER — Other Ambulatory Visit: Payer: Self-pay | Admitting: *Deleted

## 2014-09-20 ENCOUNTER — Non-Acute Institutional Stay (SKILLED_NURSING_FACILITY): Payer: Commercial Managed Care - HMO | Admitting: Internal Medicine

## 2014-09-20 DIAGNOSIS — G20 Parkinson's disease: Secondary | ICD-10-CM | POA: Diagnosis not present

## 2014-09-20 DIAGNOSIS — E785 Hyperlipidemia, unspecified: Secondary | ICD-10-CM

## 2014-09-20 DIAGNOSIS — R296 Repeated falls: Secondary | ICD-10-CM

## 2014-09-20 DIAGNOSIS — I10 Essential (primary) hypertension: Secondary | ICD-10-CM | POA: Diagnosis not present

## 2014-09-20 LAB — GLUCOSE, CAPILLARY: Glucose-Capillary: 122 mg/dL — ABNORMAL HIGH (ref 70–99)

## 2014-09-20 MED ORDER — HYDROCODONE-ACETAMINOPHEN 5-325 MG PO TABS: 1.0000 | ORAL_TABLET | Freq: Four times a day (QID) | ORAL | Status: DC | PRN

## 2014-09-20 NOTE — Telephone Encounter (Signed)
[facility] 

## 2014-09-21 LAB — GLUCOSE, CAPILLARY: Glucose-Capillary: 94 mg/dL (ref 70–99)

## 2014-09-22 LAB — GLUCOSE, CAPILLARY: GLUCOSE-CAPILLARY: 89 mg/dL (ref 70–99)

## 2014-09-22 NOTE — Progress Notes (Addendum)
Patient ID: Wendy Reynolds, female   DOB: [birth year], 74 y.o.   MRN: [id number]                 HISTORY & PHYSICAL  DATE:  09/20/2014            FACILITY: [facility]                       LEVEL OF CARE:   SNF   CHIEF COMPLAINT:  Admission to SNF, post stay at [facility], 09/14/2014 through 09/17/2014.        HISTORY OF PRESENT ILLNESS:  This is a patient who has Parkinson's disease, well controlled on amantadine only.  She tells me that she was doing reasonably well up until the early parts of this year.  She started to develop increased festination of her gait and unsteadiness.  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  At that point, Sinemet was started and she improved.  She ultimately developed worsening gait and falls.  She was told in the emergency room that she had a UTI.  The patient was felt to have some memory impairment.    In the hospital, she was diagnosed with an Enterococcus UTI.    She had acute encephalopathy which resolved, felt to be secondary to alcohol, acute renal failure with a BUN of 48 and a creatinine of 2.2.    She was also noted to be hypoglycemic.  The blood sugar on one of her metabolic panels was 57.  She had slight elevation of her AST.  A serum glucose on 09/15/2014 was 57.    She also had a left rib fracture, an L4 compression fracture.         PAST MEDICAL HISTORY/PROBLEM LIST:                    Parkinson's disease, with some worsening of her gait recently.    Enterococcus UTI, with multifactorial acute encephalopathy.    Delirium.  Currently resolved.    Multiple falls at home.   Thought to be secondary to acute illness.    Left lateral rib fracture.    Acute renal failure.  Resolved, at baseline.  At discharge, renal ultrasound was normal.    Orthostatic hypotension.    Hypoglycemia.     Back pain.  X-rays were negative except for L4 minimal compression  fracture.    Elevated AST.  Felt in the hospital to be secondary to alcohol.    Hypothyroidism.  TSH was normal.     Depression with anxiety.    CURRENT MEDICATIONS:  Medication list is reviewed.                        Cipro 500 b.i.d.  Ended yesterday.    Xanax 0.5 q.h.s.     Norco 5/325 q.6.    Amantadine 100 three times a day.    ASA 81 q.d.       Lipitor 5 mg daily.    Sinemet 25/100 three times daily.    Cymbalta 60 mg daily.    Synthroid 25 q.d.    Lisinopril 10 q.d.       Inderal 10 mg b.i.d. (?essential tremor).    SOCIAL HISTORY:                   HOUSING:  The patient,  I think, lives in her own home.   FUNCTIONAL STATUS:  She describes herself as being independent.  In fact, she is the caregiver for a disabled daughter.  She still rides horses.   ALCOHOL:  It was obtained that she uses 2-3 glasses of wine, up to half a bottle a day.  Serum alcohol was negative in the emergency department.    REVIEW OF SYSTEMS:            CHEST/RESPIRATORY:  No shortness of breath.     CARDIAC:  No chest pain.    GI:  No tenderness.    GU:  No dysuria.   No incontinence.     PHYSICAL EXAMINATION:   VITAL SIGNS:     BLOOD PRESSURE/PULSE:  120/70 supine.  When she stood up with assistance, I could not obtain a blood pressure either by auscultation or palpation.  Eventually, I was able to get a brachial pulse at 100, but this was after 2-3 minutes.  She was also already complaining that she needed to sit down.   GENERAL APPEARANCE:  The patient is dysarthric, although her speech is quite understandable.   CHEST/RESPIRATORY:  Clear air entry bilaterally.     CARDIOVASCULAR:   CARDIAC:  Heart sounds are normal.  There are no murmurs.   GASTROINTESTINAL:   LIVER/SPLEEN/KIDNEY:   No liver, no spleen.  No tenderness.    GENITOURINARY:   BLADDER:  Not enlarged.   CIRCULATION:   EDEMA/VARICOSITIES:   Extremities:  Some bruising on her legs.  Some degree of venous stasis.     NEUROLOGICAL:   CRANIAL NERVES:  Her extraocular movements are normal.   MOTOR/TONE:  She has no pronator drift.  She has no increase in tone.  No bradykinesia anywhere that I could detect.   DEEP TENDON REFLEXES:  Reflexes symmetric at the knee jerks.  Both toes flexor.   BALANCE/GAIT:  She was able to bring herself to a sitting position.  However, when she stood, her balance was very poor.  She has marked retropulsion and a narrow-based, shuffling gait.  However, at this point I think she is too unsteady to meaningfully ambulate independently.     ASSESSMENT/PLAN:                                       Parkinson's disease.  The peripheral manifestations of this disease are really absent.  However, her gait is really impaired.  It is a bit unusual to see this deteriorate so rapidly if this was all based on Parkinson's disease, of course assuming the history is accurate.  However, she may have deteriorated from the acuity of her recent illnesses.   I would wonder about worse orthostatic hypotension that I was able to measure and I will try to recheck this.  She did have a negative CT head scan.  ? If she is orthostatic wound wonder about MSA  Multiple falls.  It is not hard to understand why this is happening.  I agree with tapering the Xanax. Gait is really extremely impaired  ?Alcohol abuse.  She did not have any cerebellar signs at the bedside.  Her gait is certainly not reminiscent of a midline cerebellar problem and there is not any evidence of a neuropathy   Hyperlipidemia.  On Lipitor.    History of depression with anxiety.  On Cymbalta.    Hypothyroidism.  TSH  was normal.    Hypoglycemia in the hospital.  I will monitor this while she is here.    Enterococcus UTI.  This seems to have resolved.    ?Hypertension.  She is on Zestril and Inderal.  If she is indeed orthostatic, this may need to be decreased.    I am going to try to re-monitor her orthostatic blood pressures. A  collateral history here is really the most important item. ?ETOH ?Gaiit history ?functional level and its history. Hopefully her daughter can provide this   I will put her on monitoring for fasting hypoglycemia.

## 2014-09-27 ENCOUNTER — Non-Acute Institutional Stay (SKILLED_NURSING_FACILITY): Payer: Commercial Managed Care - HMO | Admitting: Internal Medicine

## 2014-09-27 DIAGNOSIS — G20 Parkinson's disease: Secondary | ICD-10-CM | POA: Diagnosis not present

## 2014-09-27 DIAGNOSIS — I951 Orthostatic hypotension: Secondary | ICD-10-CM

## 2014-09-27 DIAGNOSIS — R296 Repeated falls: Secondary | ICD-10-CM | POA: Diagnosis not present

## 2014-09-27 NOTE — Progress Notes (Signed)
Patient ID: Wendy Reynolds, female   DOB: 12/01/1940, 74 y.o.   MRN: [id number] Facility; [facility] Chief complaint; Parkinson's disease/multiple falls History; this is a patient I admitted to the building last week after [facility] from [facility]/16 through 2/19. She is a patient who is has Parkinson's disease followed by neurology. 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 Sometime around Christmas things started to change. She had a multiplicity of falls. She says she was "festinating" and also had start hesitation. She was started on Sinemet. It is not really clear to me from talking to her whether she actually improved her not .  She was hospitalized with multiple falls at home, acute renal injury, some degree of 5 delirium a complicated by a UTI and perhaps alcohol abuse. She was also noted to have a left lateral rib fracture and I believe an L5 fracture. She had an enterococcus UTI. Since her arrival here she has had multiple falls in her room. Physical therapy talks about noncompliance with safety issues. I have seen her today about all of this as well as to remeasure her orthostatic blood pressure.  Review of systems Respiratory no shortness of breath Cardiac no chest pain, it is difficult to get her to talk about any degree of orthostatic symptoms GI no constipation GU I am not sure about her bladder function  Physical examination Gen. the patient is not in any distress Vitals; blood pressure is 115/70 pulse 64 in the left arm supine. Standing this drops to 70 systolic pulse goes up to 80 and blood pressure rebounds to 90 systolic at 2 minutes. The patient is unsteady but not overtly syncopal or presyncopal Cardiac heart sounds are normal she appears to be euvolemic Neurologic; she does have a mild resting tremor of the right arm and right leg. There is no increase in tone, no bradykinesia  and no rigidity course she is on Sinemet and amantadine. Reflexes are brisk at the knee and ankle jerks certainly greater than her upper extremity reflexes but not overtly pathologic. Gait; this is very problematic. It is narrow based exceptionally unsteady some of this even looks beyond what I would expect for an extraparametal syndrome. It doesn't even appear that her sense of where her legs are are intact. Balance testing is very problematic.  Impression/plan #1 Parkinson's disease; I am not completely certain that the Sinemet is done her any good. I'm going to reduce the dose to twice a day and send her back to see neurology for verification that the Sinemet is indeed helped. #2 orthostatic hypotension. This is another reason for reducing the Sinemet I am also going to reduce her Zestril to 5 mg. She does have hypertension on her problem list however this is certainly not evident now. If she tolerates this I may actually try to stop it #3 combination of 1 and 2 would raise the possibility a "Parkinson's cousin" however I'm doubtful this is what she has #4 alcohol abuse. I've discussed this with the patient she states repetitively that she drinks half a bottle of wine per day I have explained to her that this may be a little much for her physiology. She appears to understand #5 multiple falls. It is difficult for me to see this translating into a home. She'll certainly need a walker although according to physical therapy she is not very compliant with their  suggestions #6 hypoglycemia in the hospital we've had no recurrence of this here, presumably question alcohol and marginal oral intake "albumin at 3.4]  She is already asking to go home. It is difficult to see this at this point. She lives with her daughter was her onset of disabilities although I saw her walking with a cane the other day. I'm going to recheck her renal function Results for Wendy Reynolds (MRN [id number]) as of 09/27/2014 11:26   Ref. Range 09/16/2014 05:57 09/17/2014 10:27 09/20/2014 18:41 09/21/2014 05:34 09/22/2014 04:51  Glucose-Capillary Latest Range: 70-99 mg/dL   782 (H) 94 89  Sodium Latest Range: 135-145 mmol/L 140      Potassium Latest Range: 3.5-5.1 mmol/L 4.4      Chloride Latest Range: 96-112 mmol/L 110      CO2 Latest Range: 19-32 mmol/L 23      BUN Latest Range: 6-23 mg/dL 37 (H)      Creatinine Latest Range: 0.50-1.10 mg/dL 9.56 (H)      Calcium Latest Range: 8.4-10.5 mg/dL 8.6      GFR calc non Af Amer Latest Range: >90 mL/min 43 (L)      GFR calc Af Amer Latest Range: >90 mL/min 50 (L)      Glucose Latest Range: 70-99 mg/dL 92      Anion gap Latest Range: 5-15  7      Alkaline Phosphatase Latest Range: 39-117 U/L 48      Albumin Latest Range: 3.5-5.2 g/dL 3.4 (L)      AST Latest Range: 0-37 U/L 34      ALT Latest Range: 0-35 U/L 22      Total Protein Latest Range: 6.0-8.3 g/dL 5.9 (L)      Total Bilirubin Latest Range: 0.3-1.2 mg/dL 0.6      Vitamin O-13 Latest Range: 211-911 pg/mL  371     Homocysteine Latest Range: 0.0-15.0 umol/L  10.1     TSH Latest Range: 0.350-4.500 uIU/mL 4.121      RPR Latest Range: Non Reactive   Non Reactive

## 2014-09-29 ENCOUNTER — Encounter (HOSPITAL_COMMUNITY)
Admission: RE | Admit: 2014-09-29 | Discharge: 2014-09-29 | Disposition: A | Discharge status: Home / Self Care | Payer: Commercial Managed Care - HMO | Source: Skilled Nursing Facility | Attending: Internal Medicine | Admitting: Internal Medicine

## 2014-09-29 LAB — BASIC METABOLIC PANEL
Anion gap: 8 (ref 5–15)
BUN: 23 mg/dL (ref 6–23)
CO2: 29 mmol/L (ref 19–32)
Calcium: 9.6 mg/dL (ref 8.4–10.5)
Chloride: 105 mmol/L (ref 96–112)
Creatinine, Ser: 0.82 mg/dL (ref 0.50–1.10)
GFR calc non Af Amer: 69 mL/min — ABNORMAL LOW (ref >90)
GFR, EST AFRICAN AMERICAN: 80 mL/min — AB (ref >90)
Glucose, Bld: 106 mg/dL — ABNORMAL HIGH (ref 70–99)
Potassium: 4.8 mmol/L (ref 3.5–5.1)
Sodium: 142 mmol/L (ref 135–145)

## 2014-09-30 ENCOUNTER — Other Ambulatory Visit: Payer: Self-pay | Admitting: *Deleted

## 2014-09-30 MED ORDER — ALPRAZOLAM 0.5 MG PO TABS: ORAL_TABLET | ORAL | Status: DC

## 2014-09-30 NOTE — Telephone Encounter (Signed)
[facility] 

## 2014-10-08 ENCOUNTER — Ambulatory Visit: Payer: Medicare HMO | Admitting: Neurology

## 2014-10-12 ENCOUNTER — Encounter: Payer: Self-pay | Admitting: Internal Medicine

## 2014-10-12 ENCOUNTER — Non-Acute Institutional Stay: Payer: Commercial Managed Care - HMO | Admitting: Internal Medicine

## 2014-10-12 DIAGNOSIS — I10 Essential (primary) hypertension: Secondary | ICD-10-CM | POA: Diagnosis not present

## 2014-10-12 DIAGNOSIS — G20 Parkinson's disease: Secondary | ICD-10-CM

## 2014-10-12 DIAGNOSIS — F411 Generalized anxiety disorder: Secondary | ICD-10-CM

## 2014-10-12 DIAGNOSIS — N289 Disorder of kidney and ureter, unspecified: Secondary | ICD-10-CM | POA: Diagnosis not present

## 2014-10-12 NOTE — Progress Notes (Signed)
Patient ID: Wendy Reynolds, female   DOB: 02-01-1941, 74 y.o.   MRN: [id number]   this is a discharge note  FACILITY: [facility]                       LEVEL OF CARE:   SNF   CHIEF COMPLAINT:  Discharge note.        HISTORY OF PRESENT ILLNESS:  This is a patient who has Parkinson's disease  initially, well controlled on amantadine only.   she was doing reasonably well up until the early parts of this year.  She started to develop increased festination of her gait and unsteadiness.  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  At that point, Sinemet was started and she improved.  She ultimately developed worsening gait and falls.  She was told in the emergency room that she had a UTI.  The patient was felt to have some memory impairment.    In the hospital, she was diagnosed with an Enterococcus UTI.    She had acute encephalopathy which resolved, felt to be secondary to alcohol, acute renal failure with a BUN of 48 and a creatinine of 2.2.    She was also noted to be hypoglycemic.  The blood sugar on one of her metabolic panels was 57.  She had slight elevation of her AST.  A serum glucose on 09/15/2014 was 57. --We have been monitoring her blood sugars here initially and they were stable although they've been discontinued I do note a spot CBG this afternoon was 127-this apparently was after eating   She also had a left rib fracture, an L4 compression fracture.   Her stay here initially was marked by multiple falls these have improved.  Dr. Leanord Hawking did reassess her secondary to concerns of orthostatic hypotension  -he reduced her Zestril to 5 mg a day and decreased or Sinemet to twice a day dosing.  A neurology consult also is pending.  Her blood pressure does appear to be improved sitting today I got 132/82 and standing 110/80 she did not complain of dizziness.  Apparently initially her issues with her being compliant  with using her walker but apparently this has improved and she appears to be doing fairly well with her walker on exam today.  She will be going home with her daughter-will need a rolling walker as well as continued PT and OT and nursing support for her medical issues.  She also has a significant history of anxiety she is on Xanax daily at bedtime this appears to help.          PAST MEDICAL HISTORY/PROBLEM LIST:                    Parkinson's disease, with some worsening of her gait recently.    Enterococcus UTI, with multifactorial acute encephalopathy.    Delirium.  Currently resolved.    Multiple falls at home.   Thought to be secondary to acute illness.    Left lateral rib fracture.    Acute renal failure.  Resolved, at baseline.  At discharge, renal ultrasound was normal.    Orthostatic hypotension.    Hypoglycemia.     Back pain.  X-rays were negative except for L4 minimal compression fracture.    Elevated AST.  Felt in the hospital to be secondary to alcohol.    Hypothyroidism.  TSH was normal.  Depression with anxiety.    CURRENT MEDICATIONS:  Medication list is reviewed.                        Cipro 500 b.i.d.  Ended yesterday.    Xanax 0.5 q.h.s.     Norco 5/325 q.6.    Amantadine 100 three times a day.    ASA 81 q.d.       Lipitor 5 mg daily.    Sinemet 25/100  two times daily.    Cymbalta 60 mg daily.    Synthroid 25 q.d.    Lisinopril 5  q.d.       Inderal 10 mg b.i.d. (?essential tremor).    SOCIAL HISTORY:                   HOUSING:  The patient, I think, lives in her own home.   FUNCTIONAL STATUS:  She describes herself as being independent.  In fact, she is the caregiver for a disabled daughter.  Sh.   ALCOHOL:  It was obtained that she uses 2-3 glasses of wine, up to half a bottle a day.  Serum alcohol was negative in the emergency department.--Dr. Leanord Hawkingobson has spoken to her that she may be drinking a bit too much for her physiology     REVIEW OF SYSTEMS Gen. no complaints of fever or chills says she feels well.  Eyes does not complain of visual changes.  Skin does not complain of rashes or itching:            CHEST/RESPIRATORY:  No shortness of breath.     CARDIAC:  No chest pain.    GI:  No tenderness. Is not complaining of any nausea or vomiting diarrhea or constipation    GU:  No dysuria.   No incontinence. Musculoskeletal does not complain of joint pain.  Neurologic as noted above history of Parkinson's she appears to have gained strength appears to be doing fairly well with her walker.  Psych -- does have a history of depression and anxiety she does appear to be anxious at times but appears to be in good spirits     PHYSICAL EXAMINATION:   VITAL SIGNS:      She is afebrile pulses 70 respirations 20 blood pressure taken sitting 132/82-standing was 110/80.   GENERAL APPEARANCE:  The patient is dysarthric, although her speech is quite understandable Her skin is warm and dry.   CHEST/RESPIRATORY:  Clear air entry bilaterally.     CARDIOVASCULAR:   CARDIAC:  Heart sounds are normal.  There are no murmurs. Regular rate and rhythm no significant lower extremity edema   GASTROINTESTINAL:   Abdomen is soft nontender with positive bowel sounds.    Marland Kitchen.   CIRCULATION:   EDEMA/VARICOSITIES:   E  Some degree of venous stasis.  minimal edema   NEUROLOGICAL:   CRANIAL NERVES:  Her extraocular movements are normal.   She is able to stand without difficulty without assistance and ambulates fairly well with her rolling walker a this apparently is an improvement from her admission presentation strength appears to be intact all 4 extremities.  Psych she is alert and oriented pleasant and appropriate  Labs.  09/29/2014.  Sodium 142 potassium 4.8 BUN 23 creatinine 0.82.  09/16/2014.  Liver function tests within normal limits except albumin of 3.4.  09/15/2014.  WBC 7.7 hemoglobin 12.3 platelets 214     .      ASSESSMENT/PLAN:  Parkinson's disease.  She continues on amantadine and Sinemet Sinemetwas recently reduced to twice a day-her gait appears to be improved although she is still weak and unsteady and needs her walker she has been advised to be quite faithful about using this-she also has a neurology consult pending.                   Multiple falls.--Apparently this has improved recently Dr. Leanord Hawking did decrease her Zestril as well as Sinemet with concerns of orthostatic hypotension-this appears to have helped-he also taper down her Xanax she is now receiving this just at at bedtime        Hyperlipidemia.  On Lipitor--since her stay here is been short will defer to primary care provider.    History of depression with anxiety.  On Cymbalta.--This appears relatively stable    Hypothyroidism.  TSH was normal.    Hypoglycemia in the hospital.  Apparently this has not been an issue in the facility-in fact Dr. Leanord Hawking discontinued her CBGs in the morning-a spot CBG today was 127.    Enterococcus UTI.  This seems to have resolved.    ?Hypertension.  She is on Zestril and Inderal.  At this point appear stable although one will have to certainly continue to monitor for any orthostatic symptoms hypotension    History of renal insufficiency-this appears to have stabilized her most recent lab will have home health update this and notify primary care provider of results.  Of note again patient will be going home with her daughter she will need continued PT and OT for strengthening as well as nursing support for her medical issues.  [id number] note greater than 30 minutes spent on this discharge summary.

## 2014-10-14 ENCOUNTER — Other Ambulatory Visit (HOSPITAL_COMMUNITY): Payer: Self-pay | Admitting: Family Medicine

## 2014-10-14 ENCOUNTER — Ambulatory Visit (HOSPITAL_COMMUNITY)
Admission: RE | Admit: 2014-10-14 | Discharge: 2014-10-14 | Disposition: A | Discharge status: Home / Self Care | Payer: Commercial Managed Care - HMO | Source: Ambulatory Visit | Attending: Family Medicine | Admitting: Family Medicine

## 2014-10-14 DIAGNOSIS — S62522A Displaced fracture of distal phalanx of left thumb, initial encounter for closed fracture: Secondary | ICD-10-CM | POA: Diagnosis not present

## 2014-10-14 DIAGNOSIS — M79642 Pain in left hand: Secondary | ICD-10-CM

## 2014-10-14 DIAGNOSIS — Z681 Body mass index (BMI) 19 or less, adult: Secondary | ICD-10-CM | POA: Diagnosis not present

## 2014-10-14 DIAGNOSIS — W19XXXA Unspecified fall, initial encounter: Secondary | ICD-10-CM | POA: Insufficient documentation

## 2014-10-19 ENCOUNTER — Encounter: Payer: Self-pay | Admitting: Neurology

## 2014-10-19 ENCOUNTER — Ambulatory Visit (INDEPENDENT_AMBULATORY_CARE_PROVIDER_SITE_OTHER): Payer: Commercial Managed Care - HMO | Admitting: Neurology

## 2014-10-19 VITALS — BP 119/77 | HR 64 | Temp 96.7°F | Resp 14 | Ht 62.0 in | Wt 104.0 lb

## 2014-10-19 DIAGNOSIS — G20 Parkinson's disease: Secondary | ICD-10-CM

## 2014-10-19 DIAGNOSIS — Z7289 Other problems related to lifestyle: Secondary | ICD-10-CM | POA: Diagnosis not present

## 2014-10-19 DIAGNOSIS — R413 Other amnesia: Secondary | ICD-10-CM | POA: Diagnosis not present

## 2014-10-19 DIAGNOSIS — F1010 Alcohol abuse, uncomplicated: Secondary | ICD-10-CM | POA: Diagnosis not present

## 2014-10-19 DIAGNOSIS — Z789 Other specified health status: Secondary | ICD-10-CM

## 2014-10-19 DIAGNOSIS — R296 Repeated falls: Secondary | ICD-10-CM

## 2014-10-19 DIAGNOSIS — Z87898 Personal history of other specified conditions: Secondary | ICD-10-CM

## 2014-10-19 DIAGNOSIS — F1011 Alcohol abuse, in remission: Secondary | ICD-10-CM

## 2014-10-19 DIAGNOSIS — Z9289 Personal history of other medical treatment: Secondary | ICD-10-CM

## 2014-10-19 NOTE — Patient Instructions (Signed)
I think your Parkinson's disease is mild, which is reassuring. Nevertheless, as you know, this disease does progress with time. It can affect your balance, your memory, your mood, your bowel and bladder function, your posture, balance and walking. Overall you are doing fairly well but I do want to suggest a few things today:  Remember to drink plenty of fluid, eat healthy meals and do not skip any meals. Try to eat protein with a every meal and eat a healthy snack such as fruit or nuts in between meals. Try to keep a regular sleep-wake schedule and try to exercise daily, particularly in the form of walking, 20-30 minutes a day, if you can. When riding a horse, be careful getting on and off the horse, use your helmet.  Taking your medication on schedule is key.   Try to stay active physically and mentally. Engage in social activities in your community and with your family and try to keep up with current events by reading the newspaper or watching the news. Try to do word puzzles and you may like to do word puzzles and brain games on the computer such as on [email or web address].   As far as your medications are concerned, I would like to suggest that you take your current medication with the following additional changes: take Sinemet 25/100 mg 1/2 pill 4 times a day, at 8 AM, 12, 4 PM and 8 PM.   I do want you to reduce and eliminate your alcohol intake.     I would like to see you back as needed.   Our phone number is [phone]. We also have an after hours call service for urgent matters and there is a physician on-call for urgent questions, that cannot wait till the next work day. For any emergencies you know to call 911 or go to the nearest emergency room.

## 2014-10-19 NOTE — Progress Notes (Signed)
Subjective:    Patient ID: Wendy Reynolds is a 74 y.o. female.  HPI     Huston Foley, MD, PhD [facility] [address], [address] [address] [place], [place] [address]  Dear Dr. Irene Limbo,   I saw Wendy Reynolds, upon your kind request in my neurologic clinic today for initial consultation of her Parkinson's disease. The patient is accompanied by her daughter, Victorino Dike, today. As you know, Wendy Reynolds is a 74 year old right-handed woman with an underlying complex medical history of hypertension, hyperlipidemia, depression, anxiety, and regular alcohol use, who was admitted to the hospital recently on 09/14/2014 through 09/17/2014 for multiple falls at home. She sustained a left lateral rib fracture. She was suspected to have excessive alcohol use, concern for alcohol abuse but had no evidence of withdrawal. I reviewed the hospital records including the discharge summary and neurological consultation note by Dr. Gerilyn Pilgrim from 09/16/2014. She follows with Dr. Gerilyn Pilgrim as an outpatient. During the hospital stay she was found to have acute kidney impairment. She had been on amantadine and Sinemet was added. Complications during the hospitalization included enterococcus UTI, acute encephalopathy in the context of acute kidney impairment, orthostatic hypotension, elevated AST which all improved. She was transferred to skilled nursing facility, [facility], on 09/17/2014 and was discharged back to home last week. She has done well since her hospital stay and her rehabilitation. We have helped quite a bit. She is able to walk without an assistant device at this time but when she was in the hospital initially she felt that she could never come out of the wheelchair. Symptoms of Parkinson's date back to a little over 2 years ago when she started noticing a right hand tremor. She had managed well with amantadine only for about 2 years or so. Her recent altered mental status started  abruptly. Her daughter describes that she was suddenly found to have visual and auditory hallucinations, personality changes, she became somewhat combative and resisted being taken to the hospital but eventually they were able to take her to the emergency room. She has an appointment with Dr. Gerilyn Pilgrim in May 2016. She is now on Sinemet 25-100 milligrams strength 1 pill twice daily and amantadine 100 mg 3 times a day. Sinemet was cut back from 4 pills a day to 2 pills a day which actually improved her mental status well according to the daughter. She no longer has been suffering from hallucinations and sleeps fairly well. While in rehabilitation she did not have any alcohol at all. She has a history of alcohol abuse and has gone through rehabilitation at least twice as I understand. She has been drinking at least 2 glasses of wine per day. The patient admits to 2 glasses of wine per day and the daughter indicates that she drinks typically more than 2 glasses of wine per day and when her symptoms of altered mental status happened she had been drinking a little bit more and it was a long weekend prior to that. 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 She rides horses. She has resumed horse riding. She is trying to eat better. She had lost weight and was in the mid 90s as far as her lowest weight but is now up to 104. She has been trying to eat 3 meals a day and is trying to drink more water. Overall, her motor function, and her mental status, and her memory have improved since her  rehabilitation. She does have mild memory loss.   Her Past Medical History Is Significant For: Past Medical History  Diagnosis Date  . Panic   . Hypertension   . Depression   . Generalized anxiety disorder 03/04/2012  . Alcohol use   . Diastolic dysfunction 03/05/2012    Grade 1. EF 60%  . Chest pain 03/04/2012    MI ruled out. Likely anxiety related.  . Hyperlipidemia 03/05/2012  . Parkinson  disease   . Hypothyroid   . Parkinson's disease     Her Past Surgical History Is Significant For: No past surgical history on file.  Her Family History Is Significant For: Family History  Problem Relation Age of Onset  . Depression Maternal Grandfather   . Depression Cousin   . Alcohol abuse Cousin   . Depression Daughter   . Alcohol abuse Mother   . Alcohol abuse Father   . Alcohol abuse Maternal Aunt     Her Social History Is Significant For: History   Social History  . Marital Status: Single    Spouse Name: N/A  . Number of Children: 1  . Years of Education: BS   Occupational History  . Not working     Social History Main Topics  . Smoking status: Never Smoker   . Smokeless tobacco: Never Used  . Alcohol Use: Yes     Comment: 2 glasses a night  . Drug Use: No  . Sexual Activity: No   Other Topics Concern  . None   Social History Narrative    Her Allergies Are:  Allergies  Allergen Reactions  . Erythromycin Nausea And Vomiting  . Penicillins Hives  :   Her Current Medications Are:  Outpatient Encounter Prescriptions as of 10/19/2014  Medication Sig  . ALPRAZolam (XANAX) 0.5 MG tablet Take one tablet by mouth at bedtime for anxiety  . amantadine (SYMMETREL) 100 MG capsule Take 100 mg by mouth 3 (three) times daily.   Marland Kitchen. aspirin 81 MG chewable tablet Chew 81 mg by mouth daily.  Marland Kitchen. atorvastatin (LIPITOR) 10 MG tablet Take 5 mg by mouth at bedtime.  . carbidopa-levodopa (SINEMET IR) 25-100 MG per tablet Take 1 tablet by mouth 2 (two) times daily. Now takes twice a day  . DULoxetine (CYMBALTA) 60 MG capsule Take 1 capsule (60 mg total) by mouth daily.  Marland Kitchen. HYDROcodone-acetaminophen (NORCO) 5-325 MG per tablet Take 1 tablet by mouth every 6 (six) hours as needed for severe pain.  Marland Kitchen. levothyroxine (SYNTHROID, LEVOTHROID) 25 MCG tablet Take 25 mcg by mouth daily.  . magnesium hydroxide (MILK OF MAGNESIA) 400 MG/5ML suspension Take by mouth daily as needed for mild  constipation.  . Multiple Vitamins-Minerals (ICAPS) CAPS Take 3 capsules by mouth daily.  . [DISCONTINUED] lisinopril (PRINIVIL,ZESTRIL) 10 MG tablet Take 5 mg by mouth daily. Now takes 5 mg a day  . [DISCONTINUED] propranolol (INDERAL) 10 MG tablet Take 1 tablet (10 mg total) by mouth 2 (two) times daily.  :   Review of Systems:  Out of a complete 14 point review of systems, all are reviewed and negative with the exception of these symptoms as listed below:   Review of Systems  Constitutional: Positive for fatigue.       Weight loss  HENT: Positive for hearing loss.   Endocrine: Positive for polydipsia.  Neurological: Positive for tremors and weakness.       Memory loss, Dizziness, Tremor  Psychiatric/Behavioral:       Depression, Anxiety  Objective:  Neurologic Exam  Physical Exam Physical Examination:   Filed Vitals:   10/19/14 0851  BP: 119/77  Pulse: 64  Temp: 96.7 F (35.9 C)  Resp: 14    General Examination: The patient is a very pleasant 74 y.o. female in no acute distress.  HEENT: Normocephalic, atraumatic, pupils are equal, round and reactive to light and accommodation. Funduscopic exam is normal with sharp disc margins noted. Extraocular tracking shows mild saccadic breakdown without nystagmus noted. There is limitation to upper gaze. There is mild decrease in eye blink rate. Hearing is intact. Face is symmetric with mild facial masking and normal facial sensation. There is no lip, neck or jaw tremor. Neck is mildly rigid with intact passive ROM. There are no carotid bruits on auscultation. Oropharynx exam reveals mild mouth dryness. No significant airway crowding is noted. Mallampati is class I. Tongue protrudes centrally and palate elevates symmetrically.   There is no drooling.   Chest: is clear to auscultation without wheezing, rhonchi or crackles noted.  Heart: sounds are regular and normal without murmurs, rubs or gallops noted.   Abdomen: is soft,  non-tender and non-distended with normal bowel sounds appreciated on auscultation.  Extremities: There is no pitting edema in the distal lower extremities bilaterally. Pedal pulses are intact.   Skin: is warm and dry with mild redness and patchy dryness noticed in both hands. She says she suffers from Raynaud's. Age-related changes are noted on the skin.   Musculoskeletal: exam reveals no obvious joint deformities, tenderness, joint swelling or erythema.  Neurologically:  Mental status: The patient is awake and alert, paying good  attention. She is able to partially provide the history. Her daughter provides details of the history. She is oriented to: person, place, time/date, situation, day of week, month of year and year. Her memory, attention, language and knowledge are impaired mildly. There is no aphasia, agnosia, apraxia or anomia. There is a mild degree of bradyphrenia. Speech is mildly hypophonic with no dysarthria noted. Mood is congruent and affect is normal.   Cranial nerves are as described above under HEENT exam. In addition, shoulder shrug is normal with equal shoulder height noted.  Motor exam: Normal bulk, and strength for age is noted. There are no dyskinesias noted.  Tone is mildly rigid with presence of cogwheeling in the right upper extremity. There is overall mild bradykinesia. There is no drift or rebound.  There is a mild resting tremor in the right upper extremity and a mild resting tremor in the right lower extremity. The tremor is intermittent.  Romberg is negative.  Reflexes are 1+ in the upper extremities and 1+ in the lower extremities. Toes are downgoing bilaterally.  Fine motor skills exam: Finger taps are mildly impaired on the right and minimally impaired on the left. Hand movements are mildly impaired on the right and minimally impaired on the left. RAP (rapid alternating patting) is mildly impaired on the right and minimally impaired on the left. Foot taps are  mildly impaired on the right and minimally impaired on the left. Foot agility (in the form of heel stomping) is mildly impaired on the right and minimally impaired on the left.    Cerebellar testing shows no dysmetria or intention tremor on finger to nose testing. Heel to shin is unremarkable bilaterally. There is no truncal or gait ataxia.   Sensory exam is intact to light touch, pinprick, vibration, temperature sense and proprioception in the upper and lower extremities.   Gait, station and  balance: She stands up from the seated position with mild difficulty and does not need to push up with Her hands. She needs no assistance. No veering to one side is noted. She is noted to lean to the right. Posture is mildly stooped for age. Stance is narrow-based. She walks with decrease in stride length and pace and decreased arm swing on the right. She turns in 3 steps. Tandem walk is mildly impaired. Balance is mildly impaired. She has some trouble turning. She has some stutter steps.    Assessment and Plan:  Assessment and Plan:  In summary, Wendy Reynolds is a very pleasant 74 y.o.-year old female with an underlying complex medical history of hypertension, hyperlipidemia, depression, anxiety, and regular alcohol use, who was admitted to the hospital recently on 09/14/2014 through 09/17/2014 for multiple falls at home. Her history and physical exam are in keeping with right-sided predominant Parkinson's disease. She has overall mild findings at this time. I reviewed her hospital records in detail today.   I had a long chat with the patient and her daughter about Her symptoms, my findings and the diagnosis of parkinsonism/Parksinson's disease, its prognosis and treatment options. We talked about medical treatments and non-pharmacological approaches. We talked about alcohol cessation and about maintaining a healthy lifestyle in general. I encouraged the patient to eat healthy, exercise daily and keep well  hydrated, to keep a scheduled bedtime and wake time routine, to not skip any meals and eat healthy snacks in between meals and to have protein with every meal. In particular, I stressed the importance of regular exercise, within of course the patient's own mobility limitations.  she is advised to use caution when riding horses. She is advised to be extremely cautious when getting on and off a horse and use her helmet at all times. She is advised to reduce and completely eliminated alcohol use at this time because of her family history of alcohol abuse but also her personal history of alcohol abuse and having been through rehabilitation at least twice in her lifetime. I do not think she needs new diagnostic testing at this time. Her memory is mildly impaired and she can clinically be monitored for memory loss. She has an appointment with her neurologist in a couple of months and I asked her to keep this appointment. In fact, with mild Parkinson's findings at this time and good results from being in rehabilitation she is advised that I could see her back on an as-needed basis.    We talked about the surgical treatment option for Parkinson's disease as well some today and we talked about future medication adjustments as well. At this juncture, I advised her to continue with amantadine 3 times a day but in the future if she has evidence of hallucinations or increase balance problems this may be something that needs to be cut back. I would like to keep her total dose of Sinemet at 2 pills but suggested that she take half a pill 4 times a day instead of 1 pill twice daily as it does not tend to last as long. I wrote this down for her. I gave her written instructions.  I answered all their questions today and the patient and her daughter were in agreement with the above outlined plan.   Thank you very much for allowing me to participate in the care of this nice patient. If I can be of any further assistance to you  please do not hesitate to call me  at [phone].  Sincerely,   Star Age, MD, PhD

## 2014-10-20 ENCOUNTER — Telehealth: Payer: Self-pay | Admitting: *Deleted

## 2014-10-20 NOTE — Telephone Encounter (Signed)
Release fax to [place]. [place]n [place] on 10/19/14 requesting notes, labs, imaging, eeg  NCV.

## 2014-11-16 ENCOUNTER — Other Ambulatory Visit (HOSPITAL_COMMUNITY): Payer: Self-pay | Admitting: Family Medicine

## 2014-11-16 DIAGNOSIS — S62502S Fracture of unspecified phalanx of left thumb, sequela: Secondary | ICD-10-CM

## 2014-11-22 ENCOUNTER — Other Ambulatory Visit (HOSPITAL_COMMUNITY): Payer: Self-pay

## 2014-11-22 ENCOUNTER — Ambulatory Visit (HOSPITAL_COMMUNITY)
Admission: RE | Admit: 2014-11-22 | Discharge: 2014-11-22 | Disposition: A | Discharge status: Home / Self Care | Payer: Commercial Managed Care - HMO | Source: Ambulatory Visit | Attending: Family Medicine | Admitting: Family Medicine

## 2014-11-22 DIAGNOSIS — S62502S Fracture of unspecified phalanx of left thumb, sequela: Secondary | ICD-10-CM | POA: Insufficient documentation

## 2014-11-22 DIAGNOSIS — M8588 Other specified disorders of bone density and structure, other site: Secondary | ICD-10-CM | POA: Diagnosis not present

## 2014-11-22 DIAGNOSIS — Z780 Asymptomatic menopausal state: Secondary | ICD-10-CM | POA: Diagnosis not present

## 2014-11-30 DIAGNOSIS — R413 Other amnesia: Secondary | ICD-10-CM | POA: Diagnosis not present

## 2014-11-30 DIAGNOSIS — R269 Unspecified abnormalities of gait and mobility: Secondary | ICD-10-CM | POA: Diagnosis not present

## 2014-11-30 DIAGNOSIS — Z79899 Other long term (current) drug therapy: Secondary | ICD-10-CM | POA: Diagnosis not present

## 2014-11-30 DIAGNOSIS — G20 Parkinson's disease: Secondary | ICD-10-CM | POA: Diagnosis not present

## 2014-12-16 DIAGNOSIS — F419 Anxiety disorder, unspecified: Secondary | ICD-10-CM | POA: Diagnosis not present

## 2014-12-16 DIAGNOSIS — Z681 Body mass index (BMI) 19 or less, adult: Secondary | ICD-10-CM | POA: Diagnosis not present

## 2014-12-16 DIAGNOSIS — G20 Parkinson's disease: Secondary | ICD-10-CM | POA: Diagnosis not present

## 2015-02-28 DIAGNOSIS — R471 Dysarthria and anarthria: Secondary | ICD-10-CM | POA: Diagnosis not present

## 2015-02-28 DIAGNOSIS — G20 Parkinson's disease: Secondary | ICD-10-CM | POA: Diagnosis not present

## 2015-02-28 DIAGNOSIS — R413 Other amnesia: Secondary | ICD-10-CM | POA: Diagnosis not present

## 2015-02-28 DIAGNOSIS — R269 Unspecified abnormalities of gait and mobility: Secondary | ICD-10-CM | POA: Diagnosis not present

## 2015-03-24 DIAGNOSIS — Z1389 Encounter for screening for other disorder: Secondary | ICD-10-CM | POA: Diagnosis not present

## 2015-03-24 DIAGNOSIS — E063 Autoimmune thyroiditis: Secondary | ICD-10-CM | POA: Diagnosis not present

## 2015-03-24 DIAGNOSIS — F419 Anxiety disorder, unspecified: Secondary | ICD-10-CM | POA: Diagnosis not present

## 2015-03-24 DIAGNOSIS — L570 Actinic keratosis: Secondary | ICD-10-CM | POA: Diagnosis not present

## 2015-03-24 DIAGNOSIS — Z681 Body mass index (BMI) 19 or less, adult: Secondary | ICD-10-CM | POA: Diagnosis not present

## 2015-03-24 DIAGNOSIS — N179 Acute kidney failure, unspecified: Secondary | ICD-10-CM | POA: Diagnosis not present

## 2015-03-24 DIAGNOSIS — Z0000 Encounter for general adult medical examination without abnormal findings: Secondary | ICD-10-CM | POA: Diagnosis not present

## 2015-03-24 DIAGNOSIS — F329 Major depressive disorder, single episode, unspecified: Secondary | ICD-10-CM | POA: Diagnosis not present

## 2015-04-13 DIAGNOSIS — Z1389 Encounter for screening for other disorder: Secondary | ICD-10-CM | POA: Diagnosis not present

## 2015-04-13 DIAGNOSIS — I10 Essential (primary) hypertension: Secondary | ICD-10-CM | POA: Diagnosis not present

## 2015-04-13 DIAGNOSIS — G20 Parkinson's disease: Secondary | ICD-10-CM | POA: Diagnosis not present

## 2015-04-13 DIAGNOSIS — E063 Autoimmune thyroiditis: Secondary | ICD-10-CM | POA: Diagnosis not present

## 2015-04-13 DIAGNOSIS — Z681 Body mass index (BMI) 19 or less, adult: Secondary | ICD-10-CM | POA: Diagnosis not present

## 2015-04-13 DIAGNOSIS — F329 Major depressive disorder, single episode, unspecified: Secondary | ICD-10-CM | POA: Diagnosis not present

## 2015-04-13 DIAGNOSIS — Z0000 Encounter for general adult medical examination without abnormal findings: Secondary | ICD-10-CM | POA: Diagnosis not present

## 2015-04-13 DIAGNOSIS — D696 Thrombocytopenia, unspecified: Secondary | ICD-10-CM | POA: Diagnosis not present

## 2015-05-06 ENCOUNTER — Emergency Department (HOSPITAL_COMMUNITY): Payer: Commercial Managed Care - HMO

## 2015-05-06 ENCOUNTER — Encounter (HOSPITAL_COMMUNITY): Payer: Self-pay | Admitting: Emergency Medicine

## 2015-05-06 ENCOUNTER — Ambulatory Visit (HOSPITAL_COMMUNITY): Payer: Self-pay

## 2015-05-06 ENCOUNTER — Emergency Department (HOSPITAL_COMMUNITY)
Admission: EM | Admit: 2015-05-06 | Discharge: 2015-05-06 | Discharge status: Against Medical Advice | Payer: Commercial Managed Care - HMO | Attending: Emergency Medicine | Admitting: Emergency Medicine

## 2015-05-06 DIAGNOSIS — F410 Panic disorder [episodic paroxysmal anxiety] without agoraphobia: Secondary | ICD-10-CM | POA: Insufficient documentation

## 2015-05-06 DIAGNOSIS — Z880 Allergy status to penicillin: Secondary | ICD-10-CM | POA: Insufficient documentation

## 2015-05-06 DIAGNOSIS — Z7982 Long term (current) use of aspirin: Secondary | ICD-10-CM | POA: Diagnosis not present

## 2015-05-06 DIAGNOSIS — W19XXXA Unspecified fall, initial encounter: Secondary | ICD-10-CM

## 2015-05-06 DIAGNOSIS — Y9301 Activity, walking, marching and hiking: Secondary | ICD-10-CM | POA: Diagnosis not present

## 2015-05-06 DIAGNOSIS — W01198A Fall on same level from slipping, tripping and stumbling with subsequent striking against other object, initial encounter: Secondary | ICD-10-CM | POA: Diagnosis not present

## 2015-05-06 DIAGNOSIS — Y9259 Other trade areas as the place of occurrence of the external cause: Secondary | ICD-10-CM | POA: Diagnosis not present

## 2015-05-06 DIAGNOSIS — G20 Parkinson's disease: Secondary | ICD-10-CM | POA: Insufficient documentation

## 2015-05-06 DIAGNOSIS — E039 Hypothyroidism, unspecified: Secondary | ICD-10-CM | POA: Diagnosis not present

## 2015-05-06 DIAGNOSIS — Y998 Other external cause status: Secondary | ICD-10-CM | POA: Insufficient documentation

## 2015-05-06 DIAGNOSIS — Z79899 Other long term (current) drug therapy: Secondary | ICD-10-CM | POA: Insufficient documentation

## 2015-05-06 DIAGNOSIS — I10 Essential (primary) hypertension: Secondary | ICD-10-CM | POA: Insufficient documentation

## 2015-05-06 DIAGNOSIS — S01511A Laceration without foreign body of lip, initial encounter: Secondary | ICD-10-CM | POA: Insufficient documentation

## 2015-05-06 DIAGNOSIS — F329 Major depressive disorder, single episode, unspecified: Secondary | ICD-10-CM | POA: Insufficient documentation

## 2015-05-06 DIAGNOSIS — S0993XA Unspecified injury of face, initial encounter: Secondary | ICD-10-CM | POA: Diagnosis present

## 2015-05-06 MED ORDER — POVIDONE-IODINE 10 % EX SOLN
CUTANEOUS | Status: AC
  Filled 2015-05-06: qty 118

## 2015-05-06 MED ORDER — LIDOCAINE-EPINEPHRINE (PF) 2 %-1:200000 IJ SOLN
INTRAMUSCULAR | Status: AC
  Filled 2015-05-06: qty 20

## 2015-05-06 NOTE — ED Notes (Signed)
Pt refusing to have xrays performed, Raynelle Fanning PA at bedside,

## 2015-05-06 NOTE — ED Notes (Addendum)
Patient with fall at [facility]. Upper lip puncture wound on right side. No LOC. States due to parkinson's disease her leg locked up and she miss stepped.

## 2015-05-06 NOTE — Discharge Instructions (Signed)
Facial Laceration A facial laceration is a cut on the face. These injuries can be painful and cause bleeding. Some cuts may need to be closed with stitches (sutures), skin adhesive strips, or wound glue. Cuts usually heal quickly but can leave a scar. It can take 1-2 years for the scar to go away completely. HOME CARE   Only take medicines as told by your doctor.  Follow your doctor's instructions for wound care. For Stitches:  Keep the cut clean and dry.  If you have a bandage (dressing), change it at least once a day. Change the bandage if it gets wet or dirty, or as told by your doctor.  Wash the cut with soap and water 2 times a day. Rinse the cut with water. Pat it dry with a clean towel.  Put a thin layer of medicated cream on the cut as told by your doctor.  You may shower after the first 24 hours. Do not soak the cut in water until the stitches are removed.  Have your stitches removed as told by your doctor.  Do not wear any makeup until a few days after your stitches are removed. For Skin Adhesive Strips:  Keep the cut clean and dry.  Do not get the strips wet. You may take a bath, but be careful to keep the cut dry.  If the cut gets wet, pat it dry with a clean towel.  The strips will fall off on their own. Do not remove the strips that are still stuck to the cut. For Wound Glue:  You may shower or take baths. Do not soak or scrub the cut. Do not swim. Avoid heavy sweating until the glue falls off on its own. After a shower or bath, pat the cut dry with a clean towel.  Do not put medicine or makeup on your cut until the glue falls off.  If you have a bandage, do not put tape over the glue.  Avoid lots of sunlight or tanning lamps until the glue falls off.  The glue will fall off on its own in 5-10 days. Do not pick at the glue. After Healing:  Put sunscreen on the cut for the first year to reduce your scar. GET HELP IF:  You have a fever. GET HELP RIGHT AWAY  IF:   Your cut area gets red, painful, or puffy (swollen). Mouth Laceration A mouth laceration is a deep cut in the lining of your mouth (mucosa). The laceration may extend into your lip or go all of the way through your mouth and cheek. Lacerations inside your mouth may involve your tongue, the insides of your cheeks, or the upper surface of your mouth (palate). Mouth lacerations may bleed a lot because your mouth has a very rich blood supply. Mouth lacerations may need to be repaired with stitches (sutures). CAUSES Any type of facial injury can cause a mouth laceration. Common causes include: Getting hit in the mouth. Being in a car accident. SYMPTOMS The most common sign of a mouth laceration is bleeding that fills the mouth. DIAGNOSIS Your health care provider can diagnose a mouth laceration by examining your mouth. Your mouth may need to be washed out (irrigated) with a sterile salt-water (saline) solution. Your health care provider may also have to remove any blood clots to determine how bad your injury is. You may need X-rays of the bones in your jaw or your face to rule out other injuries, such as dental injuries, facial fractures,  or jaw fractures. TREATMENT Treatment depends on the location and severity of your injury. Small mouth lacerations may not need treatment if bleeding has stopped. You may need sutures if: You have a tongue laceration. Your mouth laceration is large or deep, or it continues to bleed. If sutures are necessary, your health care provider will use absorbable sutures that dissolve as your body heals. You may also receive antibiotic medicine or a tetanus shot. HOME CARE INSTRUCTIONS Take medicines only as directed by your health care provider. If you were prescribed an antibiotic medicine, finish all of it even if you start to feel better. Eat as directed by your health care provider. You may only be able to drink liquids or eat soft foods for a few days. Rinse  your mouth with a warm, salt-water rinse 4-6 times per day or as directed by your health care provider. You can make a salt-water rinse by mixing one tsp of salt into two cups of warm water. Do not poke the sutures with your tongue. Doing that can loosen them. Check your wound every day for signs of infection. It is normal to have a white or gray patch over your wound while it heals. Watch for: Redness. Swelling. Blood or pus. Maintain regular oral hygiene, if possible. Gently brush your teeth with a soft, nylon-bristled toothbrush 2 times per day. Keep all follow-up visits as directed by your health care provider. This is important. SEEK MEDICAL CARE IF: You were given a tetanus shot and have swelling, severe pain, redness, or bleeding at the injection site. You have a fever. Your pain is not controlled with medicine. You have redness, swelling, or pain at your wound that is getting worse. You have fresh bleeding or pus coming from your wound. The edges of your wound break open. You develop swollen, tender glands in your throat. SEEK IMMEDIATE MEDICAL CARE IF:  Your face or the area under your jaw becomes swollen. You have trouble breathing or swallowing.   This information is not intended to replace advice given to you by your health care provider. Make sure you discuss any questions you have with your health care provider.   Document Released: 07/16/2005 Document Revised: 11/30/2014 Document Reviewed: 07/07/2014 Elsevier Interactive Patient Education 2016 [employer]   You see a yellowish-white fluid (pus) coming from the cut.   This information is not intended to replace advice given to you by your health care provider. Make sure you discuss any questions you have with your health care provider.   Document Released: 01/02/2008 Document Revised: 08/06/2014 Document Reviewed: 02/26/2013 [employer]vier Interactive Patient Education [employer]

## 2015-05-07 NOTE — ED Provider Notes (Signed)
CSN: [id number]     Arrival date & time 05/06/15  1646 History   First MD Initiated Contact with Patient 05/06/15 1803     Chief Complaint  Patient presents with  . Fall     (Consider location/radiation/quality/duration/timing/severity/associated sxs/prior Treatment) The history is provided by the patient.   Wendy Reynolds is a 74 y.o. female with a history most significant for Parkinsons disease, reporting this causes nearly daily falls for her.  She is awaiting a Parkinsons specialty clinic at [facility] (will start in January) for assistance with gait issues.  In the interim, has been advised to "not get in a hurry" while walking. She did at [facility], her leg "locked" up on her causing her to fall.  She describes a somewhat sustained fall as she was holding onto a shopping cart, went to her knees, then onto right right side then hit her upper lip against the side rail of the shopping cart causing laceration when she bit her upper lip.  She denies hitting her head, had no loc and denies nausea, dizziness, focal weakness, visual changes, confusion  Or headache since the event.  The fall occurred around 4 pm today.    Past Medical History  Diagnosis Date  . Panic   . Hypertension   . Depression   . Generalized anxiety disorder 03/04/2012  . Alcohol use (HCC)   . Diastolic dysfunction 03/05/2012    Grade 1. EF 60%  . Chest pain 03/04/2012    MI ruled out. Likely anxiety related.  . Hyperlipidemia 03/05/2012  . Parkinson disease (HCC)   . Hypothyroid   . Parkinson's disease [facility])    Past Surgical History  Procedure Laterality Date  . Dilation and curettage of uterus     Family History  Problem Relation Age of Onset  . Depression Maternal Grandfather   . Depression Cousin   . Alcohol abuse Cousin   . Depression Daughter   . Alcohol abuse Mother   . Alcohol abuse Father   . Alcohol abuse Maternal Aunt    Social History  Substance Use Topics  . Smoking status: Never Smoker   .  Smokeless tobacco: Never Used  . Alcohol Use: Yes     Comment: 2 glasses a night   OB History    No data available     Review of Systems  Constitutional: Negative for fever.  HENT: Negative for congestion, dental problem and sore throat.   Eyes: Negative.   Respiratory: Negative for chest tightness and shortness of breath.   Cardiovascular: Negative for chest pain.  Gastrointestinal: Negative for nausea and abdominal pain.  Genitourinary: Negative.   Musculoskeletal: Negative for joint swelling, arthralgias and neck pain.  Skin: Positive for wound. Negative for rash.  Neurological: Negative for dizziness, speech difficulty, weakness, light-headedness, numbness and headaches.  Psychiatric/Behavioral: Negative.       Allergies  Erythromycin and Penicillins  Home Medications   Prior to Admission medications   Medication Sig Start Date End Date Taking? Authorizing Provider  alendronate (FOSAMAX) 70 MG tablet Take 70 mg by mouth once a week.  03/24/15  Yes Historical Provider, MD  ALPRAZolam Prudy Feeler) 0.5 MG tablet Take one tablet by mouth at bedtime for anxiety Patient taking differently: Take 0.5 mg by mouth at bedtime.  09/30/14  Yes Tiffany L Reed, DO  amantadine (SYMMETREL) 100 MG capsule Take 100 mg by mouth 3 (three) times daily.  04/29/13  Yes Historical Provider, MD  aspirin 81 MG chewable tablet  Chew 81 mg by mouth daily.   Yes Historical Provider, MD  B Complex Vitamins (B COMPLEX PO) Take 1 tablet by mouth daily.   Yes Historical Provider, MD  calcium citrate (CALCITRATE - DOSED IN MG ELEMENTAL CALCIUM) 950 MG tablet Take 200 mg of elemental calcium by mouth daily.   Yes Historical Provider, MD  carbidopa-levodopa (SINEMET IR) 25-100 MG per tablet Take 1 tablet by mouth 4 (four) times daily.  08/24/14  Yes Historical Provider, MD  DULoxetine (CYMBALTA) 60 MG capsule Take 1 capsule (60 mg total) by mouth daily. 06/04/14  Yes Myrlene Broker, MD  levothyroxine (SYNTHROID,  LEVOTHROID) 25 MCG tablet Take 25 mcg by mouth daily. 04/23/14  Yes Historical Provider, MD  lisinopril (PRINIVIL,ZESTRIL) 5 MG tablet Take 5 mg by mouth daily.  03/02/15  Yes Historical Provider, MD  Multiple Vitamin (MULTIVITAMIN WITH MINERALS) TABS tablet Take 1 tablet by mouth daily.   Yes Historical Provider, MD   BP 131/79 mmHg  Pulse 68  Temp(Src) 98.5 F (36.9 C) (Oral)  Resp 13  Ht  (1.575 m)  Wt 102 lb (46.267 kg)  BMI 18.65 kg/m2  SpO2 100% Physical Exam  Constitutional: She is oriented to person, place, and time. She appears well-developed and well-nourished.  Uncomfortable appearing  HENT:  Head: Normocephalic. Head is without contusion.  Mouth/Throat: Oropharynx is clear and moist.  No dental trauma. 1 cm stellate laceration externally right upper lip, extending to mucosal surface.  The internal laceration is linear, vertical and well approximated and 0.5 cm.  Bleeding is controlled.    Eyes: EOM are normal. Pupils are equal, round, and reactive to light.  Neck: Normal range of motion and full passive range of motion without pain. Neck supple.  Cardiovascular: Normal rate and normal heart sounds.   Pulmonary/Chest: Effort normal.  Abdominal: Soft. There is no tenderness.  Musculoskeletal: Normal range of motion.  Lymphadenopathy:    She has no cervical adenopathy.  Neurological: She is alert and oriented to person, place, and time. She has normal strength. She displays tremor. No cranial nerve deficit or sensory deficit. Gait normal. GCS eye subscore is 4. GCS verbal subscore is 5. GCS motor subscore is 6.  Intentional tremor  Skin: Skin is warm and dry. No rash noted.  Psychiatric: She has a normal mood and affect. Her speech is normal and behavior is normal. Thought content normal. Cognition and memory are normal.  Nursing note and vitals reviewed.   ED Course  Procedures (including critical care time)  LACERATION REPAIR Performed by: Burgess Amor Authorized  by: Burgess Amor Consent: Verbal consent obtained. Risks and benefits: risks, benefits and alternatives were discussed Consent given by: patient Patient identity confirmed: provided demographic data Prepped and Draped in normal sterile fashion Wound explored  Laceration Location: right upper lip, approximates vermillion border  Laceration Length: 1 cm  No Foreign Bodies seen or palpated  Anesthesia: nerve block of right Infraorbital Nerve  Local anesthetic: lidocaine 2% with epinephrine  Anesthetic total: 1 ml  Irrigation method: syringe Amount of cleaning: standard  Skin closure: ethilon 6-0  Number of sutures: 4  Technique: simple interrupted  Patient tolerance: Patient tolerated the procedure well with no immediate complications.  Labs Review Labs Reviewed - No data to display  Imaging Review No results found. I have personally reviewed and evaluated these images and lab results as part of my medical decision-making.   EKG Interpretation None      MDM   Final  diagnoses:  Lip laceration, initial encounter  Fall, initial encounter    Pt discussed with Dr. Clarene Duke. Ct head, neck and maxilofacial  Recommended given age, mechanism and on daily ASA.  Pt refuses imaging, stating she has no head or facial injury except for localized laceration.  Discussed rationale for these tests and implications of missing trauma to the brain including head bleed and death, pt still refuses.  She is willing to sign ama and has done so.  Advised she should have her sutures removed in 5 days.  Water and peroxide swish and spit after meals to help keep the mucosal laceration clean.  F/u here for any problems or concerns, f/u with pcp for suture removal.      Burgess Amor, PA-C 05/07/15 1303  Samuel Jester, DO 05/11/15 1530

## 2015-05-10 DIAGNOSIS — H5210 Myopia, unspecified eye: Secondary | ICD-10-CM | POA: Diagnosis not present

## 2015-05-10 DIAGNOSIS — H5200 Hypermetropia, unspecified eye: Secondary | ICD-10-CM | POA: Diagnosis not present

## 2015-05-10 DIAGNOSIS — H2510 Age-related nuclear cataract, unspecified eye: Secondary | ICD-10-CM | POA: Diagnosis not present

## 2015-05-10 DIAGNOSIS — I10 Essential (primary) hypertension: Secondary | ICD-10-CM | POA: Diagnosis not present

## 2015-05-11 DIAGNOSIS — Z681 Body mass index (BMI) 19 or less, adult: Secondary | ICD-10-CM | POA: Diagnosis not present

## 2015-05-11 DIAGNOSIS — Z1389 Encounter for screening for other disorder: Secondary | ICD-10-CM | POA: Diagnosis not present

## 2015-05-11 DIAGNOSIS — Z4802 Encounter for removal of sutures: Secondary | ICD-10-CM | POA: Diagnosis not present

## 2015-05-13 ENCOUNTER — Telehealth (HOSPITAL_COMMUNITY): Payer: Self-pay | Admitting: *Deleted

## 2015-05-13 NOTE — Telephone Encounter (Signed)
Pt pharmacy [facility] ) requesting refill for pt Duloxetine HCL 60 mg QD. Pt medication last refilled 06-04-14 30 cap 2 refills. Pt had f/u appt for 09-06-14 but no showed. Called pt to sch appt and lmtcb and number provided.

## 2015-05-17 ENCOUNTER — Other Ambulatory Visit (HOSPITAL_COMMUNITY): Payer: Self-pay | Admitting: Psychiatry

## 2015-05-17 MED ORDER — DULOXETINE HCL 60 MG PO CPEP: 60.0000 mg | ORAL_CAPSULE | Freq: Every day | ORAL | Status: DC

## 2015-05-17 NOTE — Telephone Encounter (Signed)
I have sent in 30 days. She needs to come in

## 2015-05-18 NOTE — Telephone Encounter (Signed)
lmtcb

## 2015-05-18 NOTE — Telephone Encounter (Signed)
Pt called office back and sch a f/u appt

## 2015-06-06 ENCOUNTER — Ambulatory Visit (INDEPENDENT_AMBULATORY_CARE_PROVIDER_SITE_OTHER): Payer: Medicare HMO | Admitting: Psychiatry

## 2015-06-06 ENCOUNTER — Encounter (HOSPITAL_COMMUNITY): Payer: Self-pay | Admitting: Psychiatry

## 2015-06-06 VITALS — BP 119/70 | HR 64 | Ht 62.0 in | Wt 98.2 lb

## 2015-06-06 DIAGNOSIS — F1010 Alcohol abuse, uncomplicated: Secondary | ICD-10-CM

## 2015-06-06 DIAGNOSIS — F332 Major depressive disorder, recurrent severe without psychotic features: Secondary | ICD-10-CM | POA: Diagnosis not present

## 2015-06-06 DIAGNOSIS — F411 Generalized anxiety disorder: Secondary | ICD-10-CM | POA: Diagnosis not present

## 2015-06-06 DIAGNOSIS — G20A1 Parkinson's disease without dyskinesia, without mention of fluctuations: Secondary | ICD-10-CM

## 2015-06-06 DIAGNOSIS — G20 Parkinson's disease: Secondary | ICD-10-CM

## 2015-06-06 MED ORDER — DULOXETINE HCL 60 MG PO CPEP: 60.0000 mg | ORAL_CAPSULE | Freq: Every day | ORAL | Status: DC

## 2015-06-06 MED ORDER — ALPRAZOLAM 1 MG PO TABS: 1.0000 mg | ORAL_TABLET | Freq: Every day | ORAL | Status: DC

## 2015-06-06 NOTE — Progress Notes (Signed)
Patient ID: Wendy Reynolds, female   DOB: 03/07/1941, 74 y.o.   MRN: [id number] Patient ID: Wendy Fraiseamela H Capers, female   DOB: 10/21/1940, 74 y.o.   MRN: [id number] Patient ID: Wendy Fraiseamela H Chaplin, female   DOB: 01/29/1941, 74 y.o.   MRN: [id number] Patient ID: Wendy Fraiseamela H Petrella, female   DOB: 02/13/1941, 74 y.o.   MRN: [id number] Patient ID: Wendy Fraiseamela H Lorge, female   DOB: 04/29/1941, 74 y.o.   MRN: [id number] [facility] [id number] Progress Note Wendy Fraiseamela H Strada MRN: [id number] DOB: 02/23/1941 Age: 74 y.o.  Date: 06/06/2015  Chief Complaint  Patient presents with  . Depression  . Anxiety  . Follow-up   History of present illness. This patient is a 74 year old divorced white female who lives with her daughter in [place]. 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 She now has been diagnosed with Parkinson's disease and she shakes a lot. The propranolol has helped a lot with tremor. She's also on Symmetrel from her neurologist. Overall she functions very well. She owns  several horses. Her daughter has severe depression and tried to kill her self 12 years ago by jumping off a parking ramp and is now in a wheelchair. The daughter is very functional now. The patient states that her main complaint is difficulty with sleeping. She sleeps with a Vistaril for 3-4 hours and then wakes up. She was on a low dose of Xanax in the past and it worked better. I don't see any contraindication other than she drinks a glass or 2 of wine at night and this will need to be curtailed. She is in agreement  The patient returns after  1 year. She has missed some appointments. She got fired from the nursing home last year after some patience fell under her care. She has been diagnosed with Parkinson's disease and now takes amantadine and Sinemet. She was hospitalized in February after she fell and became weak and had a  UTI. She has been working on her own home PT and her balance is much improved. She's back to riding horses. She had been on Cymbalta but it ran out last month. She called about it and I called in but she didn't know this and didn't pick it up. She's been more depressed lately due to difficulties with her daughter. Her daughter has been in and out of psychiatric hospitals herself. The patient is starting to ride horses again and is careful. She denies suicidal ideation but her mood has been low and she would like to get back on the Cymbalta   Past psychiatric history Patient has a long history of depression and anxiety.  She denies any previous history of suicidal attempt or any inpatient psychiatric treatment.  However she endorse one admission do to alcohol-related.  In the past she has taken numerous psychotropic medication.  She was seeing in this office since 2003 and taken Prozac, Effexor, amitriptyline, Cymbalta, trazodone, Celexa, Neurontin and Ambien.  Patient denies any history of psychosis paranoia or manic-like symptoms.  Allergies: Allergies  Allergen Reactions  . Erythromycin Nausea And Vomiting  . Penicillins Hives   Medical History: Past Medical History  Diagnosis Date  . Panic   . Hypertension   . Depression   . Generalized anxiety disorder 03/04/2012  . Alcohol use (HCC)   . Diastolic dysfunction 03/05/2012    Grade 1.  EF 60%  . Chest pain 03/04/2012    MI ruled out. Likely anxiety related.  . Hyperlipidemia 03/05/2012  . Parkinson disease (HCC)   . Hypothyroid   . Parkinson's disease [facility])   Patient see Dr. Sherwood Gambler.  She has a history of hyperlipidemia and hypertension.  Recently she was admitted for chest pain in May 2013.  No chest pain since.  Surgical History: Past Surgical History  Procedure Laterality Date  . Dilation and curettage of uterus     Family History: family history includes Alcohol abuse in her cousin, father, maternal aunt, and mother; Depression in her  cousin, daughter, and maternal grandfather. Patient endorse multiple family member had psychiatric illness.  Her grandmother, cousins, mother has significant depression and had tried suicidal attempt.  Her daughter also tried suicidal attempt and currently living with the patient. Reviewed and nothing has changed.  Psychosocial history Patient lives with her daughter who had recently had suicidal attempt.  Patient has one son who lives in [place] however patient has limited contact with him.  Patient has been divorced.  Patient denies any history of physical sexual or verbal abuse.  Patient enjoys horse riding.  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  Alcohol and substance use history Patient admitted history of alcohol abuse in the past.  She has at least one admission due to alcohol-related.  She was drinking at least one drink every night.  However patient claimed to be sober since her last visit.    Education and work history Patient has a Naval architect.  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  Mental status examination.   Patient is elderly woman who is casually dressed and fairly groomed.  She appears anxious but cooperative.  She maintained fair eye contact.  Her speech is fast with the very soft tone and volume.  Her thought process is also logical.  There were no flight of ideas or loose association.  She denies any active or passive suicidal thoughts or homicidal thoughts.  She has minimal tremors  Day and states that her parkinsonism is being controlled with medicationShe described her mood as anxious and her affect is mood appropriate.  She denies any auditory or visual hallucination.  Her fund of knowledge is adequate.  There were no psychotic symptoms present at this time.  Her attention and concentration is okay.  She's alert and oriented x3.  Her insight judgment and impulse control is okay.  Lab Results:  Results for orders placed or performed during the  hospital encounter of 09/17/14 (from the past 8736 hour(s))  Glucose, capillary   Collection Time: 09/20/14  6:41 PM  Result Value Ref Range   Glucose-Capillary 122 (H) 70 - 99 mg/dL  Glucose, capillary   Collection Time: 09/21/14  5:34 AM  Result Value Ref Range   Glucose-Capillary 94 70 - 99 mg/dL  Glucose, capillary   Collection Time: 09/22/14  4:51 AM  Result Value Ref Range   Glucose-Capillary 89 70 - 99 mg/dL  Results for orders placed or performed during the hospital encounter of 09/29/14 (from the past 8736 hour(s))  Basic metabolic panel   Collection Time: 09/29/14  7:00 AM  Result Value Ref Range   Sodium 142 135 - 145 mmol/L   Potassium 4.8 3.5 - 5.1 mmol/L   Chloride 105 96 - 112 mmol/L   CO2 29 19 - 32 mmol/L   Glucose, Bld 106 (H) 70 - 99 mg/dL   BUN 23 6 -  23 mg/dL   Creatinine, Ser 1.61 0.50 - 1.10 mg/dL   Calcium 9.6 8.4 - 09.6 mg/dL   GFR calc non Af Amer 69 (L) >90 mL/min   GFR calc Af Amer 80 (L) >90 mL/min   Anion gap 8 5 - 15  Results for orders placed or performed during the hospital encounter of 09/14/14 (from the past 8736 hour(s))  Ethanol   Collection Time: 09/14/14 11:01 PM  Result Value Ref Range   Alcohol, Ethyl (B) <5 0 - 9 mg/dL  CBC   Collection Time: 09/15/14  5:35 AM  Result Value Ref Range   WBC 7.7 4.0 - 10.5 K/uL   RBC 3.96 3.87 - 5.11 MIL/uL   Hemoglobin 12.3 12.0 - 15.0 g/dL   HCT 04.5 40.9 - 81.1 %   MCV 94.7 78.0 - 100.0 fL   MCH 31.1 26.0 - 34.0 pg   MCHC 32.8 30.0 - 36.0 g/dL   RDW 91.4 78.2 - 95.6 %   Platelets 214 150 - 400 K/uL  Comprehensive metabolic panel   Collection Time: 09/15/14  5:35 AM  Result Value Ref Range   Sodium 137 135 - 145 mmol/L   Potassium 5.1 3.5 - 5.1 mmol/L   Chloride 103 96 - 112 mmol/L   CO2 21 19 - 32 mmol/L   Glucose, Bld 57 (L) 70 - 99 mg/dL   BUN 48 (H) 6 - 23 mg/dL   Creatinine, Ser 2.13 (H) 0.50 - 1.10 mg/dL   Calcium 9.2 8.4 - 08.6 mg/dL   Total Protein 6.7 6.0 - 8.3 g/dL   Albumin  4.0 3.5 - 5.2 g/dL   AST 40 (H) 0 - 37 U/L   ALT 21 0 - 35 U/L   Alkaline Phosphatase 55 39 - 117 U/L   Total Bilirubin 1.3 (H) 0.3 - 1.2 mg/dL   GFR calc non Af Amer 21 (L) >90 mL/min   GFR calc Af Amer 24 (L) >90 mL/min   Anion gap 13 5 - 15  Glucose, capillary   Collection Time: 09/15/14  7:44 AM  Result Value Ref Range   Glucose-Capillary 56 (L) 70 - 99 mg/dL   Comment 1 Notify RN   Glucose, capillary   Collection Time: 09/15/14  8:22 AM  Result Value Ref Range   Glucose-Capillary 111 (H) 70 - 99 mg/dL  Urine rapid drug screen (hosp performed)   Collection Time: 09/15/14  6:31 PM  Result Value Ref Range   Opiates POSITIVE (A) NONE DETECTED   Cocaine NONE DETECTED NONE DETECTED   Benzodiazepines POSITIVE (A) NONE DETECTED   Amphetamines NONE DETECTED NONE DETECTED   Tetrahydrocannabinol NONE DETECTED NONE DETECTED   Barbiturates NONE DETECTED NONE DETECTED  Comprehensive metabolic panel   Collection Time: 09/16/14  5:57 AM  Result Value Ref Range   Sodium 140 135 - 145 mmol/L   Potassium 4.4 3.5 - 5.1 mmol/L   Chloride 110 96 - 112 mmol/L   CO2 23 19 - 32 mmol/L   Glucose, Bld 92 70 - 99 mg/dL   BUN 37 (H) 6 - 23 mg/dL   Creatinine, Ser 5.78 (H) 0.50 - 1.10 mg/dL   Calcium 8.6 8.4 - 46.9 mg/dL   Total Protein 5.9 (L) 6.0 - 8.3 g/dL   Albumin 3.4 (L) 3.5 - 5.2 g/dL   AST 34 0 - 37 U/L   ALT 22 0 - 35 U/L   Alkaline Phosphatase 48 39 - 117 U/L   Total Bilirubin 0.6 0.3 -  1.2 mg/dL   GFR calc non Af Amer 43 (L) >90 mL/min   GFR calc Af Amer 50 (L) >90 mL/min   Anion gap 7 5 - 15  TSH   Collection Time: 09/16/14  5:57 AM  Result Value Ref Range   TSH 4.121 0.350 - 4.500 uIU/mL  Homocysteine   Collection Time: 09/17/14 10:27 AM  Result Value Ref Range   Homocysteine 10.1 0.0 - 15.0 umol/L  RPR   Collection Time: 09/17/14 10:27 AM  Result Value Ref Range   RPR Ser Ql Non Reactive Non Reactive  Vitamin B12   Collection Time: 09/17/14 10:27 AM  Result Value Ref  Range   Vitamin B-12 371 211 - 911 pg/mL  Results for orders placed or performed during the hospital encounter of 09/14/14 (from the past 8736 hour(s))  CBC with Differential   Collection Time: 09/14/14 12:03 PM  Result Value Ref Range   WBC 8.1 4.0 - 10.5 K/uL   RBC 4.24 3.87 - 5.11 MIL/uL   Hemoglobin 13.4 12.0 - 15.0 g/dL   HCT 11.9 14.7 - 82.9 %   MCV 95.0 78.0 - 100.0 fL   MCH 31.6 26.0 - 34.0 pg   MCHC 33.3 30.0 - 36.0 g/dL   RDW 56.2 13.0 - 86.5 %   Platelets 202 150 - 400 K/uL   Neutrophils Relative % 74 43 - 77 %   Neutro Abs 6.0 1.7 - 7.7 K/uL   Lymphocytes Relative 16 12 - 46 %   Lymphs Abs 1.3 0.7 - 4.0 K/uL   Monocytes Relative 8 3 - 12 %   Monocytes Absolute 0.7 0.1 - 1.0 K/uL   Eosinophils Relative 2 0 - 5 %   Eosinophils Absolute 0.2 0.0 - 0.7 K/uL   Basophils Relative 0 0 - 1 %   Basophils Absolute 0.0 0.0 - 0.1 K/uL  Comprehensive metabolic panel   Collection Time: 09/14/14 12:03 PM  Result Value Ref Range   Sodium 136 135 - 145 mmol/L   Potassium 5.0 3.5 - 5.1 mmol/L   Chloride 103 96 - 112 mmol/L   CO2 24 19 - 32 mmol/L   Glucose, Bld 92 70 - 99 mg/dL   BUN 36 (H) 6 - 23 mg/dL   Creatinine, Ser 7.84 (H) 0.50 - 1.10 mg/dL   Calcium 9.5 8.4 - 69.6 mg/dL   Total Protein 7.3 6.0 - 8.3 g/dL   Albumin 4.7 3.5 - 5.2 g/dL   AST 41 (H) 0 - 37 U/L   ALT 16 0 - 35 U/L   Alkaline Phosphatase 56 39 - 117 U/L   Total Bilirubin 1.1 0.3 - 1.2 mg/dL   GFR calc non Af Amer 20 (L) >90 mL/min   GFR calc Af Amer 24 (L) >90 mL/min   Anion gap 9 5 - 15  Ethanol   Collection Time: 09/14/14 12:03 PM  Result Value Ref Range   Alcohol, Ethyl (B) <5 0 - 9 mg/dL  Troponin I   Collection Time: 09/14/14 12:03 PM  Result Value Ref Range   Troponin I <0.03 <0.031 ng/mL  Urinalysis, Routine w reflex microscopic   Collection Time: 09/14/14  2:51 PM  Result Value Ref Range   Color, Urine YELLOW YELLOW   APPearance CLEAR CLEAR   Specific Gravity, Urine >1.030 (H) 1.005 -  1.030   pH 5.5 5.0 - 8.0   Glucose, UA NEGATIVE NEGATIVE mg/dL   Hgb urine dipstick NEGATIVE NEGATIVE   Bilirubin Urine SMALL (A)  NEGATIVE   Ketones, ur 15 (A) NEGATIVE mg/dL   Protein, ur TRACE (A) NEGATIVE mg/dL   Urobilinogen, UA 0.2 0.0 - 1.0 mg/dL   Nitrite NEGATIVE NEGATIVE   Leukocytes, UA LARGE (A) NEGATIVE  Urine microscopic-add on   Collection Time: 09/14/14  2:51 PM  Result Value Ref Range   Squamous Epithelial / LPF MANY (A) RARE   WBC, UA 11-20 <3 WBC/hpf   Bacteria, UA FEW (A) RARE  Urine culture   Collection Time: 09/14/14  4:54 PM  Result Value Ref Range   Specimen Description URINE, CLEAN CATCH    Special Requests IMMUNE:COMPROMISED    Colony Count      >=100,000 COLONIES/ML Performed at [facility]    Culture      ENTEROCOCCUS SPECIES Performed at [facility]    Report Status 09/16/2014 FINAL    Organism ID, Bacteria ENTEROCOCCUS SPECIES       Susceptibility   Enterococcus species - MIC*    AMPICILLIN <=2 SENSITIVE Sensitive     LEVOFLOXACIN 1 SENSITIVE Sensitive     NITROFURANTOIN <=16 SENSITIVE Sensitive     VANCOMYCIN <=0.5 SENSITIVE Sensitive     TETRACYCLINE >=16 RESISTANT Resistant     * ENTEROCOCCUS SPECIES    Assessment Axis I generalized anxiety disorder, Maj. depressive disorder recurrent, alcohol abuse by history Axis II deferred Axis III hypertension and hyperlipidemia, Parkinson's disease Axis IV moderate Axis V 65-70  Plan/Discussion: The patient has benefited from Cymbalta  It will be restarted at 60 mg daily. She will continue Xanax but increase the dose to 1 mg at bedtime to make things easier than try to break up pills to makes 0.75 mg. She has been warned about keeping her alcohol use  At a very minimal amount. She'll return in 3 months  MEDICATIONS this encounter: Meds ordered this encounter  Medications  . DISCONTD: ALPRAZolam (XANAX) 1 MG tablet    Sig: Take 0.75 mg by mouth at bedtime as needed for  anxiety.  . calcium carbonate (OSCAL) 1500 (600 CA) MG TABS tablet    Sig: Take 1,500 mg by mouth every other day.  . DULoxetine (CYMBALTA) 60 MG capsule    Sig: Take 1 capsule (60 mg total) by mouth daily.    Dispense:  30 capsule    Refill:  2  . ALPRAZolam (XANAX) 1 MG tablet    Sig: Take 1 tablet (1 mg total) by mouth at bedtime.    Dispense:  30 tablet    Refill:  1   Medical Decision Making Problem Points:  Established problem, stable/improving (1), New problem, with no additional work-up planned (3), Review of last therapy session (1) and Review of psycho-social stressors (1) Data Points:  Decision to obtain old records (1) Review or order clinical lab tests (1) Review and summation of old records (2) Review of medication regiment & side effects (2)  I certify that outpatient services furnished can reasonably be expected to improve the patient's condition.   Diannia Ruder, MD

## 2015-06-08 DIAGNOSIS — F419 Anxiety disorder, unspecified: Secondary | ICD-10-CM | POA: Diagnosis not present

## 2015-06-08 DIAGNOSIS — F329 Major depressive disorder, single episode, unspecified: Secondary | ICD-10-CM | POA: Diagnosis not present

## 2015-06-08 DIAGNOSIS — I10 Essential (primary) hypertension: Secondary | ICD-10-CM | POA: Diagnosis not present

## 2015-06-08 DIAGNOSIS — R2689 Other abnormalities of gait and mobility: Secondary | ICD-10-CM | POA: Diagnosis not present

## 2015-06-08 DIAGNOSIS — G20 Parkinson's disease: Secondary | ICD-10-CM | POA: Diagnosis not present

## 2015-06-08 DIAGNOSIS — E785 Hyperlipidemia, unspecified: Secondary | ICD-10-CM | POA: Diagnosis not present

## 2015-06-08 DIAGNOSIS — M6281 Muscle weakness (generalized): Secondary | ICD-10-CM | POA: Diagnosis not present

## 2015-08-30 DIAGNOSIS — G20 Parkinson's disease: Secondary | ICD-10-CM | POA: Diagnosis not present

## 2015-08-30 DIAGNOSIS — I10 Essential (primary) hypertension: Secondary | ICD-10-CM | POA: Diagnosis not present

## 2015-08-30 DIAGNOSIS — E785 Hyperlipidemia, unspecified: Secondary | ICD-10-CM | POA: Diagnosis not present

## 2015-08-30 DIAGNOSIS — Z7982 Long term (current) use of aspirin: Secondary | ICD-10-CM | POA: Diagnosis not present

## 2015-08-31 ENCOUNTER — Telehealth (HOSPITAL_COMMUNITY): Payer: Self-pay | Admitting: *Deleted

## 2015-08-31 MED ORDER — ALPRAZOLAM 1 MG PO TABS: 1.0000 mg | ORAL_TABLET | Freq: Every day | ORAL | Status: DC

## 2015-08-31 NOTE — Telephone Encounter (Signed)
Called pt pharmacy and spoke with [facility]

## 2015-08-31 NOTE — Telephone Encounter (Signed)
Per Dr. Tenny Craw to call in pt Xanax. Called pharmacy and spoke [facility]

## 2015-08-31 NOTE — Telephone Encounter (Signed)
Pt called stating she is out of her Xanax. Pt medication was last filled 06-06-15 with 1 refill. Pt number is [phone].

## 2015-08-31 NOTE — Telephone Encounter (Signed)
You may call in one month supply 

## 2015-09-06 ENCOUNTER — Encounter (HOSPITAL_COMMUNITY): Payer: Self-pay | Admitting: Psychiatry

## 2015-09-06 ENCOUNTER — Ambulatory Visit (INDEPENDENT_AMBULATORY_CARE_PROVIDER_SITE_OTHER): Payer: Medicare HMO | Admitting: Psychiatry

## 2015-09-06 VITALS — BP 124/71 | HR 70 | Ht 62.0 in | Wt 100.0 lb

## 2015-09-06 DIAGNOSIS — F332 Major depressive disorder, recurrent severe without psychotic features: Secondary | ICD-10-CM | POA: Diagnosis not present

## 2015-09-06 DIAGNOSIS — F1010 Alcohol abuse, uncomplicated: Secondary | ICD-10-CM

## 2015-09-06 DIAGNOSIS — F411 Generalized anxiety disorder: Secondary | ICD-10-CM

## 2015-09-06 DIAGNOSIS — G20 Parkinson's disease: Secondary | ICD-10-CM

## 2015-09-06 MED ORDER — ALPRAZOLAM 1 MG PO TABS: 1.0000 mg | ORAL_TABLET | Freq: Every day | ORAL | Status: DC

## 2015-09-06 MED ORDER — DULOXETINE HCL 60 MG PO CPEP: 60.0000 mg | ORAL_CAPSULE | Freq: Every day | ORAL | Status: AC

## 2015-09-06 NOTE — Progress Notes (Signed)
Patient ID: Wendy Reynolds, female   DOB: Dec 07, 1940, 75 y.o.   MRN: [id number] Patient ID: Wendy Reynolds, female   DOB: Oct 29, 1940, 75 y.o.   MRN: [id number] Patient ID: Wendy Reynolds, female   DOB: 13-Dec-1940, 75 y.o.   MRN: [id number] Patient ID: Wendy Reynolds, female   DOB: [birth year], 75 y.o.   MRN: [id number] Patient ID: Wendy Reynolds, female   DOB: [birth year], 75 y.o.   MRN: [id number] Patient ID: Wendy Reynolds, female   DOB: 09-29-1940, 75 y.o.   MRN: [id number] [facility] [id number] Progress Note Wendy Reynolds MRN: [id number] DOB: June 25, 1941 Age: 75 y.o.  Date: 09/06/2015  Chief Complaint  Patient presents with  . Depression  . Anxiety  . Follow-up   History of present illness. This patient is a 75 year old divorced white female who lives with her daughter in [place]. 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 She now has been diagnosed with Parkinson's disease and she shakes a lot. The propranolol has helped a lot with tremor. She's also on Symmetrel from her neurologist. Overall she functions very well. She owns  several horses. Her daughter has severe depression and tried to kill her self 12 years ago by jumping off a parking ramp and is now in a wheelchair. The daughter is very functional now. The patient states that her main complaint is difficulty with sleeping. She sleeps with a Vistaril for 3-4 hours and then wakes up. She was on a low dose of Xanax in the past and it worked better. I don't see any contraindication other than she drinks a glass or 2 of wine at night and this will need to be curtailed. She is in agreement  The patient returns after 3 months. She is doing much better. She saw neurologist at [facility] who stated that she had atypical Parkinson's disease-progressive supranuclear palsy. She stopped her amantadine and the patient is  feeling much better. Her Sinemet has been increased. She seems to have less tremor and is walking with more ease. Her mood is been good and she continues to ride horses although carefully. She uses 1 Xanax at bedtime to sleep. She has lost some weight but it may be because the amantadine was making her nauseous   Past psychiatric history Patient has a long history of depression and anxiety.  She denies any previous history of suicidal attempt or any inpatient psychiatric treatment.  However she endorse one admission do to alcohol-related.  In the past she has taken numerous psychotropic medication.  She was seeing in this office since 2003 and taken Prozac, Effexor, amitriptyline, Cymbalta, trazodone, Celexa, Neurontin and Ambien.  Patient denies any history of psychosis paranoia or manic-like symptoms.  Allergies: Allergies  Allergen Reactions  . Erythromycin Nausea And Vomiting  . Penicillins Hives   Medical History: Past Medical History  Diagnosis Date  . Panic   . Hypertension   . Depression   . Generalized anxiety disorder 03/04/2012  . Alcohol use (HCC)   . Diastolic dysfunction 03/05/2012    Grade 1. EF 60%  . Chest pain 03/04/2012    MI ruled out. Likely anxiety related.  . Hyperlipidemia 03/05/2012  . Parkinson disease (HCC)   . Hypothyroid   . Parkinson's disease [facility])   Patient see Dr. Sherwood Gambler.  She has a history of hyperlipidemia and hypertension.  Recently she was admitted for chest pain in May 2013.  No chest pain since.  Surgical History: Past Surgical History  Procedure Laterality Date  . Dilation and curettage of uterus     Family History: family history includes Alcohol abuse in her cousin, father, maternal aunt, and mother; Depression in her cousin, daughter, and maternal grandfather. Patient endorse multiple family member had psychiatric illness.  Her grandmother, cousins, mother has significant depression and had tried suicidal attempt.  Her daughter also tried  suicidal attempt and currently living with the patient. Reviewed and nothing has changed.  Psychosocial history Patient lives with her daughter who had recently had suicidal attempt.  Patient has one son who lives in [place] however patient has limited contact with him.  Patient has been divorced.  Patient denies any history of physical sexual or verbal abuse.  Patient enjoys horse riding.  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  Alcohol and substance use history Patient admitted history of alcohol abuse in the past.  She has at least one admission due to alcohol-related.  She was drinking at least one drink every night.  However patient claimed to be sober since her last visit.    Education and work history Patient has a Naval architect.  She's working as a Adult nurse in [employer].  Mental status examination.   Patient is elderly woman who is casually dressed and fairly groomed.  She appears anxious but cooperative.  She maintained fair eye contact.  Her speech is fast with the very soft tone and volume.  Her thought process is also logical.  There were no flight of ideas or loose association.  She denies any active or passive suicidal thoughts or homicidal thoughts.  She has minimal tremors   and states that her parkinsonism is being controlled with medicationShe described her mood as good and her affect is mood appropriate.  She denies any auditory or visual hallucination.  Her fund of knowledge is adequate.  There were no psychotic symptoms present at this time.  Her attention and concentration is okay.  She's alert and oriented x3.  Her insight judgment and impulse control is okay.  Lab Results:  Results for orders placed or performed during the hospital encounter of 09/17/14 (from the past 8736 hour(s))  Glucose, capillary   Collection Time: 09/20/14  6:41 PM  Result Value Ref Range   Glucose-Capillary 122 (H) 70 - 99 mg/dL  Glucose, capillary   Collection Time: 09/21/14   5:34 AM  Result Value Ref Range   Glucose-Capillary 94 70 - 99 mg/dL  Glucose, capillary   Collection Time: 09/22/14  4:51 AM  Result Value Ref Range   Glucose-Capillary 89 70 - 99 mg/dL  Results for orders placed or performed during the hospital encounter of 09/29/14 (from the past 8736 hour(s))  Basic metabolic panel   Collection Time: 09/29/14  7:00 AM  Result Value Ref Range   Sodium 142 135 - 145 mmol/L   Potassium 4.8 3.5 - 5.1 mmol/L   Chloride 105 96 - 112 mmol/L   CO2 29 19 - 32 mmol/L   Glucose, Bld 106 (H) 70 - 99 mg/dL   BUN 23 6 - 23 mg/dL   Creatinine, Ser 1.61 0.50 - 1.10 mg/dL   Calcium 9.6 8.4 - 09.6 mg/dL   GFR calc non Af Amer 69 (L) >90 mL/min   GFR calc Af Amer 80 (L) >90 mL/min   Anion gap 8 5 - 15  Results for  orders placed or performed during the hospital encounter of 09/14/14 (from the past 8736 hour(s))  Ethanol   Collection Time: 09/14/14 11:01 PM  Result Value Ref Range   Alcohol, Ethyl (B) <5 0 - 9 mg/dL  CBC   Collection Time: 09/15/14  5:35 AM  Result Value Ref Range   WBC 7.7 4.0 - 10.5 K/uL   RBC 3.96 3.87 - 5.11 MIL/uL   Hemoglobin 12.3 12.0 - 15.0 g/dL   HCT 16.1 09.6 - 04.5 %   MCV 94.7 78.0 - 100.0 fL   MCH 31.1 26.0 - 34.0 pg   MCHC 32.8 30.0 - 36.0 g/dL   RDW 40.9 81.1 - 91.4 %   Platelets 214 150 - 400 K/uL  Comprehensive metabolic panel   Collection Time: 09/15/14  5:35 AM  Result Value Ref Range   Sodium 137 135 - 145 mmol/L   Potassium 5.1 3.5 - 5.1 mmol/L   Chloride 103 96 - 112 mmol/L   CO2 21 19 - 32 mmol/L   Glucose, Bld 57 (L) 70 - 99 mg/dL   BUN 48 (H) 6 - 23 mg/dL   Creatinine, Ser 7.82 (H) 0.50 - 1.10 mg/dL   Calcium 9.2 8.4 - 95.6 mg/dL   Total Protein 6.7 6.0 - 8.3 g/dL   Albumin 4.0 3.5 - 5.2 g/dL   AST 40 (H) 0 - 37 U/L   ALT 21 0 - 35 U/L   Alkaline Phosphatase 55 39 - 117 U/L   Total Bilirubin 1.3 (H) 0.3 - 1.2 mg/dL   GFR calc non Af Amer 21 (L) >90 mL/min   GFR calc Af Amer 24 (L) >90 mL/min   Anion  gap 13 5 - 15  Glucose, capillary   Collection Time: 09/15/14  7:44 AM  Result Value Ref Range   Glucose-Capillary 56 (L) 70 - 99 mg/dL   Comment 1 Notify RN   Glucose, capillary   Collection Time: 09/15/14  8:22 AM  Result Value Ref Range   Glucose-Capillary 111 (H) 70 - 99 mg/dL  Urine rapid drug screen (hosp performed)   Collection Time: 09/15/14  6:31 PM  Result Value Ref Range   Opiates POSITIVE (A) NONE DETECTED   Cocaine NONE DETECTED NONE DETECTED   Benzodiazepines POSITIVE (A) NONE DETECTED   Amphetamines NONE DETECTED NONE DETECTED   Tetrahydrocannabinol NONE DETECTED NONE DETECTED   Barbiturates NONE DETECTED NONE DETECTED  Comprehensive metabolic panel   Collection Time: 09/16/14  5:57 AM  Result Value Ref Range   Sodium 140 135 - 145 mmol/L   Potassium 4.4 3.5 - 5.1 mmol/L   Chloride 110 96 - 112 mmol/L   CO2 23 19 - 32 mmol/L   Glucose, Bld 92 70 - 99 mg/dL   BUN 37 (H) 6 - 23 mg/dL   Creatinine, Ser 2.13 (H) 0.50 - 1.10 mg/dL   Calcium 8.6 8.4 - 08.6 mg/dL   Total Protein 5.9 (L) 6.0 - 8.3 g/dL   Albumin 3.4 (L) 3.5 - 5.2 g/dL   AST 34 0 - 37 U/L   ALT 22 0 - 35 U/L   Alkaline Phosphatase 48 39 - 117 U/L   Total Bilirubin 0.6 0.3 - 1.2 mg/dL   GFR calc non Af Amer 43 (L) >90 mL/min   GFR calc Af Amer 50 (L) >90 mL/min   Anion gap 7 5 - 15  TSH   Collection Time: 09/16/14  5:57 AM  Result Value Ref Range   TSH 4.121  0.350 - 4.500 uIU/mL  Homocysteine   Collection Time: 09/17/14 10:27 AM  Result Value Ref Range   Homocysteine 10.1 0.0 - 15.0 umol/L  RPR   Collection Time: 09/17/14 10:27 AM  Result Value Ref Range   RPR Ser Ql Non Reactive Non Reactive  Vitamin B12   Collection Time: 09/17/14 10:27 AM  Result Value Ref Range   Vitamin B-12 371 211 - 911 pg/mL  Results for orders placed or performed during the hospital encounter of 09/14/14 (from the past 8736 hour(s))  CBC with Differential   Collection Time: 09/14/14 12:03 PM  Result Value Ref  Range   WBC 8.1 4.0 - 10.5 K/uL   RBC 4.24 3.87 - 5.11 MIL/uL   Hemoglobin 13.4 12.0 - 15.0 g/dL   HCT 96.0 45.4 - 09.8 %   MCV 95.0 78.0 - 100.0 fL   MCH 31.6 26.0 - 34.0 pg   MCHC 33.3 30.0 - 36.0 g/dL   RDW 11.9 14.7 - 82.9 %   Platelets 202 150 - 400 K/uL   Neutrophils Relative % 74 43 - 77 %   Neutro Abs 6.0 1.7 - 7.7 K/uL   Lymphocytes Relative 16 12 - 46 %   Lymphs Abs 1.3 0.7 - 4.0 K/uL   Monocytes Relative 8 3 - 12 %   Monocytes Absolute 0.7 0.1 - 1.0 K/uL   Eosinophils Relative 2 0 - 5 %   Eosinophils Absolute 0.2 0.0 - 0.7 K/uL   Basophils Relative 0 0 - 1 %   Basophils Absolute 0.0 0.0 - 0.1 K/uL  Comprehensive metabolic panel   Collection Time: 09/14/14 12:03 PM  Result Value Ref Range   Sodium 136 135 - 145 mmol/L   Potassium 5.0 3.5 - 5.1 mmol/L   Chloride 103 96 - 112 mmol/L   CO2 24 19 - 32 mmol/L   Glucose, Bld 92 70 - 99 mg/dL   BUN 36 (H) 6 - 23 mg/dL   Creatinine, Ser 5.62 (H) 0.50 - 1.10 mg/dL   Calcium 9.5 8.4 - 13.0 mg/dL   Total Protein 7.3 6.0 - 8.3 g/dL   Albumin 4.7 3.5 - 5.2 g/dL   AST 41 (H) 0 - 37 U/L   ALT 16 0 - 35 U/L   Alkaline Phosphatase 56 39 - 117 U/L   Total Bilirubin 1.1 0.3 - 1.2 mg/dL   GFR calc non Af Amer 20 (L) >90 mL/min   GFR calc Af Amer 24 (L) >90 mL/min   Anion gap 9 5 - 15  Ethanol   Collection Time: 09/14/14 12:03 PM  Result Value Ref Range   Alcohol, Ethyl (B) <5 0 - 9 mg/dL  Troponin I   Collection Time: 09/14/14 12:03 PM  Result Value Ref Range   Troponin I <0.03 <0.031 ng/mL  Urinalysis, Routine w reflex microscopic   Collection Time: 09/14/14  2:51 PM  Result Value Ref Range   Color, Urine YELLOW YELLOW   APPearance CLEAR CLEAR   Specific Gravity, Urine >1.030 (H) 1.005 - 1.030   pH 5.5 5.0 - 8.0   Glucose, UA NEGATIVE NEGATIVE mg/dL   Hgb urine dipstick NEGATIVE NEGATIVE   Bilirubin Urine SMALL (A) NEGATIVE   Ketones, ur 15 (A) NEGATIVE mg/dL   Protein, ur TRACE (A) NEGATIVE mg/dL   Urobilinogen, UA  0.2 0.0 - 1.0 mg/dL   Nitrite NEGATIVE NEGATIVE   Leukocytes, UA LARGE (A) NEGATIVE  Urine microscopic-add on   Collection Time: 09/14/14  2:51 PM  Result Value Ref Range   Squamous Epithelial / LPF MANY (A) RARE   WBC, UA 11-20 <3 WBC/hpf   Bacteria, UA FEW (A) RARE  Urine culture   Collection Time: 09/14/14  4:54 PM  Result Value Ref Range   Specimen Description URINE, CLEAN CATCH    Special Requests IMMUNE:COMPROMISED    Colony Count      >=100,000 COLONIES/ML Performed at [facility]    Culture      ENTEROCOCCUS SPECIES Performed at [facility]    Report Status 09/16/2014 FINAL    Organism ID, Bacteria ENTEROCOCCUS SPECIES       Susceptibility   Enterococcus species - MIC*    AMPICILLIN <=2 SENSITIVE Sensitive     LEVOFLOXACIN 1 SENSITIVE Sensitive     NITROFURANTOIN <=16 SENSITIVE Sensitive     VANCOMYCIN <=0.5 SENSITIVE Sensitive     TETRACYCLINE >=16 RESISTANT Resistant     * ENTEROCOCCUS SPECIES    Assessment Axis I generalized anxiety disorder, Maj. depressive disorder recurrent, alcohol abuse by history Axis II deferred Axis III hypertension and hyperlipidemia, Parkinson's disease Axis IV moderate Axis V 65-70  Plan/Discussion: The patient has benefited from Cymbalta  It will be continued at 60 mg daily. She will continue Xanax 1 mg at bedtime . She'll return in 3 months  MEDICATIONS this encounter: Meds ordered this encounter  Medications  . Carbidopa-Levodopa (SINEMET PO)    Sig: Take 200 mg by mouth 3 (three) times daily.  . DULoxetine (CYMBALTA) 60 MG capsule    Sig: Take 1 capsule (60 mg total) by mouth daily.    Dispense:  30 capsule    Refill:  2  . ALPRAZolam (XANAX) 1 MG tablet    Sig: Take 1 tablet (1 mg total) by mouth at bedtime.    Dispense:  30 tablet    Refill:  2   Medical Decision Making Problem Points:  Established problem, stable/improving (1), New problem, with no additional work-up planned (3), Review of last  therapy session (1) and Review of psycho-social stressors (1) Data Points:  Decision to obtain old records (1) Review or order clinical lab tests (1) Review and summation of old records (2) Review of medication regiment & side effects (2)  I certify that outpatient services furnished can reasonably be expected to improve the patient's condition.   Diannia Ruder, MD

## 2015-09-07 ENCOUNTER — Ambulatory Visit: Payer: Commercial Managed Care - HMO | Attending: Neurology | Admitting: Physical Therapy

## 2015-09-07 DIAGNOSIS — R2689 Other abnormalities of gait and mobility: Secondary | ICD-10-CM | POA: Insufficient documentation

## 2015-09-07 DIAGNOSIS — R293 Abnormal posture: Secondary | ICD-10-CM | POA: Diagnosis not present

## 2015-09-07 DIAGNOSIS — R29898 Other symptoms and signs involving the musculoskeletal system: Secondary | ICD-10-CM | POA: Insufficient documentation

## 2015-09-07 DIAGNOSIS — R269 Unspecified abnormalities of gait and mobility: Secondary | ICD-10-CM | POA: Insufficient documentation

## 2015-09-07 NOTE — Therapy (Signed)
[facility] [address] [place], [place], [address] Phone: [phone]   Fax:  [phone]  Physical Therapy Evaluation  Patient Details  Name: Wendy Reynolds MRN: [id number] Date of Birth: [birth year] Referring Provider: Wynema Birch  Encounter Date: 09/07/2015      PT End of Session - 09/07/15 1628    Visit Number 1   Number of Visits 21   Date for PT Re-Evaluation 11/07/15   Authorization Type [employer]-Auth required   PT Start Time 1450   PT Stop Time 1535   PT Time Calculation (min) 45 min   Equipment Utilized During Treatment Gait belt   Activity Tolerance Patient tolerated treatment well   Behavior During Therapy [facility] for tasks assessed/performed      Past Medical History  Diagnosis Date  . Panic   . Hypertension   . Depression   . Generalized anxiety disorder 03/04/2012  . Alcohol use (HCC)   . Diastolic dysfunction 03/05/2012    Grade 1. EF 60%  . Chest pain 03/04/2012    MI ruled out. Likely anxiety related.  . Hyperlipidemia 03/05/2012  . Parkinson disease (HCC)   . Hypothyroid   . Parkinson's disease [facility])     Past Surgical History  Procedure Laterality Date  . Dilation and curettage of uterus      There were no vitals filed for this visit.  Visit Diagnosis:  Abnormality of gait  Postural instability  Festinating gait  Muscle rigidity      Subjective Assessment - 09/07/15 1453    Subjective Pt is a 75 year old female who presents to OP PT with Parkinson's disease, recent diagnosis of supranuclear palsy.  Pt reports symptoms starting about 5 years ago.  She has RUE tremors, history of falls, history of hallucinations.  Pt also reports freezing episodes, especially when she tries to go backwards and sometimes sideways.  Pt reports multiple falls in the past 6 months-up to 12 falls in one day, but now 1-2 falls/day, sometimes not at all.   Patient Stated Goals Pt's goal for therapy is  to have less falls and to not freeze up so much with walking.   Currently in Pain? No/denies            [facility] PT Assessment - 09/07/15 1457    Assessment   Medical Diagnosis Parkinson's disease/progressive supranuclear palsy   Referring Provider Wynema Birch   Onset Date/Surgical Date --  over the past year   Precautions   Precautions Fall   Balance Screen   Has the patient fallen in the past 6 months Yes   How many times? --  multiple   Has the patient had a decrease in activity level because of a fear of falling?  No   Is the patient reluctant to leave their home because of a fear of falling?  No   Home Environment   Living Environment Private residence   Living Arrangements Children  Daughter has SCI who is fairly independent   Available Help at Discharge Family  Daughter assists with light household tasks-wc level   Type of Home House   Home Access Stairs to enter   Entrance Stairs-Number of Steps 10   Entrance Stairs-Rails Can reach both   Home Layout One level   Home Equipment Walker - 2 wheels   Prior Function   Level of Independence Independent;Independent with basic ADLs;Independent with household mobility without device;Independent with community mobility without device  Vocation Retired  Adult nurse   Leisure Rides horses, gym activities-elliptical, bike, treadmill, strengthening exercises-2-3 times per week   Observation/Other Assessments   Focus on Therapeutic Outcomes (FOTO)  NA   Posture/Postural Control   Posture/Postural Control Postural limitations   Postural Limitations Rounded Shoulders   ROM / Strength   AROM / PROM / Strength Strength;PROM   PROM   Overall PROM Comments Increased stiffness in RLE with P/ROM   Strength   Overall Strength Comments Grossly tested at elast 4/5 bilateral lower extremities   Transfers   Transfers Sit to Stand;Stand to Sit   Sit to Stand 5: Supervision;Without upper extremity assist;From chair/3-in-1   Reports difficulty from lower, softer surfaces   Five time sit to stand comments  11.31  posterior pull, with decr. forward lean into full standing   Stand to Sit 5: Supervision;Without upper extremity assist;To chair/3-in-1   Ambulation/Gait   Ambulation/Gait Yes   Ambulation/Gait Assistance 5: Supervision   Ambulation Distance (Feet) 200 Feet   Assistive device None   Gait Pattern Step-through pattern;Festinating;Decreased weight shift to right;Decreased trunk rotation;Narrow base of support;Wide base of support;Poor foot clearance - left;Poor foot clearance - right   Ambulation Surface Level;Indoor   Gait velocity 8.46 sec- 3.88 ft/sec   Gait Comments With straight line gait, pt has normal arm swing, step through pattern with gait with widened BOS.  However, with turns and with quick changes of direction, pt has decreased foot clearance, narrow BOS, festinating gait pattern, multiple shuffling steps, at times requiring therapist assist to prevent falls.   Standardized Balance Assessment   Standardized Balance Assessment Timed Up and Go Test;Four Square Step Test   Timed Up and Go Test   Normal TUG (seconds) 9.38   Manual TUG (seconds) 10.52   Cognitive TUG (seconds) 10.51   TUG Comments Episodes of decreased foot clearance/festinating steps with turns and turn to sit with manual and cognitive TUG.   Four Square Step Test    Trial One  16.64  Requires PT assist at time to prevent LOB side/back   High Level Balance   High Level Balance Comments Posterior push and release test-pt needs therapist assist to prevent falling   Functional Gait  Assessment   Gait assessed  Yes   Gait Level Surface Walks 20 ft, slow speed, abnormal gait pattern, evidence for imbalance or deviates 10-15 in outside of the 12 in walkway width. Requires more than 7 sec to ambulate 20 ft.   Change in Gait Speed Able to change speed, demonstrates mild gait deviations, deviates 6-10 in outside of the 12 in walkway  width, or no gait deviations, unable to achieve a major change in velocity, or uses a change in velocity, or uses an assistive device.   Gait with Horizontal Head Turns Performs head turns smoothly with slight change in gait velocity (eg, minor disruption to smooth gait path), deviates 6-10 in outside 12 in walkway width, or uses an assistive device.   Gait with Vertical Head Turns Performs task with slight change in gait velocity (eg, minor disruption to smooth gait path), deviates 6 - 10 in outside 12 in walkway width or uses assistive device   Gait and Pivot Turn Cannot turn safely, requires assistance to turn and stop.   Step Over Obstacle Is able to step over one shoe box (4.5 in total height) but must slow down and adjust steps to clear box safely. May require verbal cueing.   Gait with Narrow Base of  Support Ambulates less than 4 steps heel to toe or cannot perform without assistance.   Gait with Eyes Closed Walks 20 ft, uses assistive device, slower speed, mild gait deviations, deviates 6-10 in outside 12 in walkway width. Ambulates 20 ft in less than 9 sec but greater than 7 sec.   Ambulating Backwards Cannot walk 20 ft without assistance, severe gait deviations or imbalance, deviates greater than 15 in outside 12 in walkway width or will not attempt task.  Pt takes multiple tiny steps backward, near LOB-PT assists   Steps Alternating feet, no rail.   Total Score 13   FGA comment: Scores <15/30 are indicative of increased fall risk in PD patients, <22/30 indicative of increased fall risk in community dwelling older adults.                             PT Short Term Goals - 09/07/15 1636    PT SHORT TERM GOAL #1   Title Pt will verbalize understanding of fall prevention within the home environment.  TARGET 09/23/15   Time 2   Period Weeks   Status New   PT SHORT TERM GOAL #2   Title Pt will verbalize/demonstrate understanding of tips to prevent freezing with gait and  turns.   Time 4   Period Weeks   Status New   PT SHORT TERM GOAL #3   Title Pt will perform at least 8 of 10 reps of sit<>stand transfers from 18 inch surfaces or lower with no posterior lean or loss of balance, for improved safety/efficiency with transfers.   Time 4   Period Weeks   Status New           PT Long Term Goals - 09/07/15 1638    PT LONG TERM GOAL #1   Title Pt will be independent with HEP, including LSVT BIG exercises.  TARGET 11/21/15   Time 6   Period Weeks   Status New   PT LONG TERM GOAL #2   Title Pt will improve Functional Gait Assessment score to at least 18/30 for decreased risk of falls.   Time 6   Period Weeks   Status New   PT LONG TERM GOAL #3   Title Pt will improve 4-square step test score to less than or equal to 12 seconds with no LOB for decreased fall risk.   Time 6   Period Weeks   Status New   PT LONG TERM GOAL #4   Title Pt will report at least 25% reduction in falls over 4 week period for improved balance/decreased falling frequency.   Time 6   Period Weeks   Status New               Plan - 09/07/15 1630    Clinical Impression Statement Pt is a 75 year old female with Parkinson's disease/possible supranuclear palsy, who presents to OP PT eval today with rigidity, festination/freezing of gait, decreased transfer efficiency/safety, decreased balance, history of falls 1-2times/day, decreased timing and coordination of gait and turns, decreased funcitional strength.  Pt presents with at least 9 comorbildities per PMH.  Pt's clinical presenation is evolving due to pt's frequent falls and plans for MRI tomorrow.  Per objective testing, pt is at fall risk per Functional Gait Assessment and 4-square step test.  Pt would benefit from further skilled PT to address the above stated deficits to improve functional mobility and to decrease fall risk/frequency  of falls.  Plan to address immediate needs of fall prevention and tips to reduce freezing  with gait and turns, then transition into LSVT BIG therapy per pt/MD request as soon as able to schedule appropriately.   Pt will benefit from skilled therapeutic intervention in order to improve on the following deficits Abnormal gait;Decreased balance;Decreased mobility;Decreased safety awareness;Decreased coordination;Decreased strength;Difficulty walking   Rehab Potential Good   PT Frequency 2x / week   PT Duration 2 weeks  then 4x/wk 4 wks LSVT BIG   PT Treatment/Interventions ADLs/Self Care Home Management;Gait training;Functional mobility training;Therapeutic activities;Therapeutic exercise;Neuromuscular re-education;Balance training;Patient/family education   PT Next Visit Plan Fall prevention, tips to reduce freezing with gait and turns, safe/proper transfer technique, weightshifting   Consulted and Agree with Plan of Care Patient          G-Codes - 10-03-15 1643    Functional Assessment Tool Used 4-square step test:  16.64 sec, Functional Gait assessment 13/30; at least 1-2 falls per day   Functional Limitation Mobility: Walking and moving around   Mobility: Walking and Moving Around Current Status [phone]) At least 40 percent but less than 60 percent impaired, limited or restricted   Mobility: Walking and Moving Around Goal Status [phone]) At least 20 percent but less than 40 percent impaired, limited or restricted       Problem List Patient Active Problem List   Diagnosis Date Noted  . UTI (lower urinary tract infection) 09/15/2014  . Acute kidney injury (HCC) 09/15/2014  . Acute encephalopathy 09/15/2014  . Orthostatic hypotension 09/15/2014  . Hypoglycemia 09/15/2014  . Frequent falls   . Renal insufficiency 09/14/2014  . Parkinson's disease (tremor, stiffness, slow motion, unstable posture) (HCC) 05/15/2013  . 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 Diastolic dysfunction 03/05/2012  . Hyperlipidemia 03/05/2012  . Chest pain  03/04/2012  . Generalized anxiety disorder 03/04/2012  . HTN (hypertension) 03/04/2012  . Depression 03/04/2012    Guerin Lashomb W. 10-03-15, 4:44 PM Gean Maidens., PT  [facility] [address] [place], [place], [address] Phone: [phone]   Fax:  [phone]  Name: Wendy Reynolds MRN: [id number] Date of Birth: Jul 23, 1941

## 2015-09-08 DIAGNOSIS — G20 Parkinson's disease: Secondary | ICD-10-CM | POA: Diagnosis not present

## 2015-09-09 ENCOUNTER — Ambulatory Visit: Payer: Commercial Managed Care - HMO | Admitting: Physical Therapy

## 2015-09-09 DIAGNOSIS — R2689 Other abnormalities of gait and mobility: Secondary | ICD-10-CM

## 2015-09-09 DIAGNOSIS — R293 Abnormal posture: Secondary | ICD-10-CM | POA: Diagnosis not present

## 2015-09-09 DIAGNOSIS — R269 Unspecified abnormalities of gait and mobility: Secondary | ICD-10-CM | POA: Diagnosis not present

## 2015-09-09 DIAGNOSIS — R29898 Other symptoms and signs involving the musculoskeletal system: Secondary | ICD-10-CM | POA: Diagnosis not present

## 2015-09-09 NOTE — Therapy (Signed)
[facility] [address] [place], [place], [address] Phone: [phone]   Fax:  [phone]  Physical Therapy Treatment  Patient Details  Name: Wendy Reynolds MRN: [id number] Date of Birth: [birth year] Referring Provider: Wynema Birch  Encounter Date: 09/09/2015      PT End of Session - 09/09/15 1638    Visit Number 2   Number of Visits 21   Date for PT Re-Evaluation 11/07/15   Authorization Type [employer]- No Auth required   PT Start Time 0804   PT Stop Time 0844   PT Time Calculation (min) 40 min   Activity Tolerance Patient tolerated treatment well   Behavior During Therapy [facility] for tasks assessed/performed      Past Medical History  Diagnosis Date  . Panic   . Hypertension   . Depression   . Generalized anxiety disorder 03/04/2012  . Alcohol use (HCC)   . Diastolic dysfunction 03/05/2012    Grade 1. EF 60%  . Chest pain 03/04/2012    MI ruled out. Likely anxiety related.  . Hyperlipidemia 03/05/2012  . Parkinson disease (HCC)   . Hypothyroid   . Parkinson's disease [facility])     Past Surgical History  Procedure Laterality Date  . Dilation and curettage of uterus      There were no vitals filed for this visit.  Visit Diagnosis:  Abnormality of gait  Postural instability  Festinating gait      Subjective Assessment - 09/09/15 0806    Subjective No complaints, nothing new.  Since the Symmetrel has been stopped for me, I feel that I have more tremors, especially in my foot.  Larey Seat twice yesterday trying to back up in the [place] parking lot.   Patient Stated Goals Pt's goal for therapy is to have less falls and to not freeze up so much with walking.   Currently in Pain? No/denies                         [facility] Adult PT Treatment/Exercise - 09/09/15 0001    Transfers   Transfers Sit to Stand;Stand to Sit   Sit to Stand 5: Supervision;6: Modified independent (Device/Increase  time);Without upper extremity assist;With upper extremity assist;From bed;From chair/3-in-1;From elevated surface   Stand to Sit 6: Modified independent (Device/Increase time);5: Supervision;With upper extremity assist;Without upper extremity assist;To elevated surface;To bed;To chair/3-in-1   Number of Reps 10 reps;Other sets (comment)  each from 22", 20", then 18" chairs   Transfer Cueing Cues provided for proper technique, increased forward lean, increased use of momentum for transfers.   High Level Balance   High Level Balance Comments At chair upon standing:  lateral weightshifting x 10 reps, 3 sets, then stagger stance anterior/posterior weigthshifting x 10 reps; followed by lateral weightshifting and stagger stance weightshifting at counter, for improved W-S and turns.  Practiced turning techniques including weightshifting turns and quarter turns.  Pt has increased difficulty with quarter turns.  In standing with narrow BOS, pt reports increased increased incidence of posterior feeling of LOB.  Discussed/practiced strategy of stagger stance ant/post weightshifting through hips to offset this posterior LOB feeling.   Self-Care   Self-Care Other Self-Care Comments   Other Self-Care Comments  Discussed tips to reduce freezing and posterior loss of balance.                PT Education - 09/09/15 1637    Education provided Yes   Education  Details Sit<>stand transfer technique, tips to reduce freezing with gait and turns   Person(s) Educated Patient   Methods Explanation;Demonstration;Handout   Comprehension Verbalized understanding;Returned demonstration;Need further instruction      Neuro Re-education:  Continued-  Short distance gait mat<>chair with practice on weightshift turns, safe turns to sit.  Discussed/practiced the need for pacing, deliberate movement patterns and use of eyes/visual targets for improved posture in standing.      PT Short Term Goals - 09/07/15 1636    PT  SHORT TERM GOAL #1   Title Pt will verbalize understanding of fall prevention within the home environment.  TARGET 09/23/15   Time 2   Period Weeks   Status New   PT SHORT TERM GOAL #2   Title Pt will verbalize/demonstrate understanding of tips to prevent freezing with gait and turns.   Time 4   Period Weeks   Status New   PT SHORT TERM GOAL #3   Title Pt will perform at least 8 of 10 reps of sit<>stand transfers from 18 inch surfaces or lower with no posterior lean or loss of balance, for improved safety/efficiency with transfers.   Time 4   Period Weeks   Status New           PT Long Term Goals - 09/07/15 1638    PT LONG TERM GOAL #1   Title Pt will be independent with HEP, including LSVT BIG exercises.  TARGET 11/21/15   Time 6   Period Weeks   Status New   PT LONG TERM GOAL #2   Title Pt will improve Functional Gait Assessment score to at least 18/30 for decreased risk of falls.   Time 6   Period Weeks   Status New   PT LONG TERM GOAL #3   Title Pt will improve 4-square step test score to less than or equal to 12 seconds with no LOB for decreased fall risk.   Time 6   Period Weeks   Status New   PT LONG TERM GOAL #4   Title Pt will report at least 25% reduction in falls over 4 week period for improved balance/decreased falling frequency.   Time 6   Period Weeks   Status New               Plan - 09/09/15 1639    Clinical Impression Statement Focus of session today is on transfer safety and technique as well as tips to reduce freezing/posterior loss of balance.  Pt has decreased forward lean, difficulty with static standing with posterior lean, needing assistance to regain balance.  Practiced/discussed weightshifting through hips for improved balance.  Pt will continue to benefit from further skilled PT to address balance, posture, gait.   Pt will benefit from skilled therapeutic intervention in order to improve on the following deficits Abnormal gait;Decreased  balance;Decreased mobility;Decreased safety awareness;Decreased coordination;Decreased strength;Difficulty walking   Rehab Potential Good   PT Frequency 2x / week   PT Duration 2 weeks  then 4x/wk 4 wks LSVT BIG   PT Treatment/Interventions ADLs/Self Care Home Management;Gait training;Functional mobility training;Therapeutic activities;Therapeutic exercise;Neuromuscular re-education;Balance training;Patient/family education   PT Next Visit Plan Fall prevention, review tips to reduce freezing with gait and turns, safe/proper transfer technique, weightshifting and turns   Consulted and Agree with Plan of Care Patient        Problem List Patient Active Problem List   Diagnosis Date Noted  . UTI (lower urinary tract infection) 09/15/2014  . Acute kidney  injury (HCC) 09/15/2014  . Acute encephalopathy 09/15/2014  . Orthostatic hypotension 09/15/2014  . Hypoglycemia 09/15/2014  . Frequent falls   . Renal insufficiency 09/14/2014  . Parkinson's disease (tremor, stiffness, slow motion, unstable posture) (HCC) 05/15/2013  . 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 Diastolic dysfunction 03/05/2012  . Hyperlipidemia 03/05/2012  . Chest pain 03/04/2012  . Generalized anxiety disorder 03/04/2012  . HTN (hypertension) 03/04/2012  . Depression 03/04/2012    MARRIOTT,AMY W. 09/09/2015, 4:42 PM  Gean Maidens., PT  [facility] [address] [place], [place], [address] Phone: [phone]   Fax:  [phone]  Name: SHAYLE DONAHOO MRN: [id number] Date of Birth: 1941/04/01

## 2015-09-09 NOTE — Patient Instructions (Signed)
Sit to Stand Transfers:  1. Scoot out to the edge of the chair 2. Place your feet flat on the floor, shoulder width apart.  Make sure your feet are tucked just under your knees. 3. Lean forward (nose over toes) with momentum, and stand up tall with your best posture.  If you need to use your arms, use them as a quick boost up to stand. 4. If you are in a low or soft chair, you can lean back and then forward up to stand, in order to get more momentum. 5. Once you are standing, make sure you are looking ahead and standing tall.  To sit down:  1. Back up until you feel the chair behind your legs. 2. Bend at you hips, reaching  Back for you chair, if needed, then slowly squat to sit down on your chair.   Tips to reduce freezing episodes with standing or walking:  6. Stand tall with your feet wide, so that you can rock and weight shift through your hips. 7. Don't try to fight the freeze: if you begin taking slower, faster, smaller steps, STOP, get your posture tall, and RESET your posture and balance.  Take a deep breath before taking the BIG step to start again. 8. March in place, with high knee stepping, to get started walking again. 9. Use auditory cues:  Count out loud, think of a familiar tune or song or cadence, use pocket metronome, to use rhythm to get started walking again. 10. Use visual cues:  Use a line to step over, use laser pointer line to step over, (using BIG steps) to start walking again. 11. Use visual targets to keep your posture tall (look ahead and focus on an object or target at eye level). 12. As you approach where your destination with walking, count your steps out loud and/or focus on your target with your eyes until you are fully there. 13. Use appropriate assistive device, as advised by your physical therapist to assist with taking longer, consistent steps. 14.

## 2015-09-12 ENCOUNTER — Ambulatory Visit: Payer: Commercial Managed Care - HMO | Admitting: *Deleted

## 2015-09-14 ENCOUNTER — Ambulatory Visit: Payer: Commercial Managed Care - HMO | Admitting: Physical Therapy

## 2015-09-14 DIAGNOSIS — R2689 Other abnormalities of gait and mobility: Secondary | ICD-10-CM | POA: Diagnosis not present

## 2015-09-14 DIAGNOSIS — R293 Abnormal posture: Secondary | ICD-10-CM

## 2015-09-14 DIAGNOSIS — R29898 Other symptoms and signs involving the musculoskeletal system: Secondary | ICD-10-CM | POA: Diagnosis not present

## 2015-09-14 DIAGNOSIS — R269 Unspecified abnormalities of gait and mobility: Secondary | ICD-10-CM

## 2015-09-14 NOTE — Patient Instructions (Signed)
Postural Strategies: Hip    Standing with feet apart at your counter.  Slowly bend forward through hips. Return to upright position, then slowly bend backward through hips. (Think of a "hinge" position at your hips-your knees should remain straight.) Hold each position __3__ seconds. Repeat _2 sets of 10___ times per session. Do __1-2__ sessions per day.  Copyright  VHI. All rights reserved.  Weight Shift: Diagonal    Stand at the counter with your feet in a "staggered" position.  Slowly shift weight forward over right leg. Return to starting position. Shift backward over opposite leg. Hold each position _2-3___ seconds.  (This should be like a marching activity in the stagger stance position.)  Repeat __10__ times per session. Do _1-2___ sessions per day. Take your time as you switch feet positions.  Copyright  VHI. All rights reserved.

## 2015-09-14 NOTE — Therapy (Signed)
[facility] [address] [place], [place], [address] Phone: [phone]   Fax:  [phone]  Physical Therapy Treatment  Patient Details  Name: Wendy Reynolds MRN: [id number] Date of Birth: September 22, 1940 Referring Provider: Wynema Birch  Encounter Date: 09/14/2015      PT End of Session - 09/14/15 2058    Visit Number 3   Number of Visits 21   Date for PT Re-Evaluation 11/07/15   Authorization Type [employer]- No Auth required   PT Start Time 0805   PT Stop Time 0848   PT Time Calculation (min) 43 min   Activity Tolerance Patient tolerated treatment well   Behavior During Therapy [facility] for tasks assessed/performed      Past Medical History  Diagnosis Date  . Panic   . Hypertension   . Depression   . Generalized anxiety disorder 03/04/2012  . Alcohol use (HCC)   . Diastolic dysfunction 03/05/2012    Grade 1. EF 60%  . Chest pain 03/04/2012    MI ruled out. Likely anxiety related.  . Hyperlipidemia 03/05/2012  . Parkinson disease (HCC)   . Hypothyroid   . Parkinson's disease [facility])     Past Surgical History  Procedure Laterality Date  . Dilation and curettage of uterus      There were no vitals filed for this visit.  Visit Diagnosis:  Festinating gait  Abnormality of gait  Postural instability      Subjective Assessment - 09/14/15 0809    Subjective Saturday and Sunday were good days-didn't freeze up at all; Monday was bad-had a hard time moving.  Had two falls backwards yesterday.   Patient Stated Goals Pt's goal for therapy is to have less falls and to not freeze up so much with walking.   Currently in Pain? No/denies                         [facility] Adult PT Treatment/Exercise - 09/14/15 0817    Transfers   Transfers Sit to Stand;Stand to Sit   Sit to Stand 5: Supervision;6: Modified independent (Device/Increase time);Without upper extremity assist;With upper extremity assist;From  bed;From chair/3-in-1;From elevated surface   Stand to Sit 6: Modified independent (Device/Increase time);5: Supervision;With upper extremity assist;Without upper extremity assist;To elevated surface;To bed;To chair/3-in-1   Number of Reps 10 reps;Other sets (comment)  from 22", 18" surfaces 10 reps each   Transfer Cueing Cues provided for increased forward lean.   High Level Balance   High Level Balance Comments At chair/counter upon standing:  lateral weightshifting x 10 reps, 2 sets, then stagger stance anterior/posterior weigthshifting x 10 reps; followed by lateral weightshifting and stagger stance weightshifting at counter, for improved W-S and turns.  With lateral and stagger stance weightshifting, focused on deliberate foot placement and marching/stomping for improved initiation to get out of freezing episode. Practiced turning techniques including weightshifting turns.  Practiced short distance gait/turns with wide base of support and weightshifting.    Review of HEP given last visit-pt requires cues for techniqu   Self-Care   Self-Care Other Self-Care Comments   Other Self-Care Comments  Based on patient's reports of significant fluctuations in mobility over the past few days, discussed medication management, including taking Sinemet on time every time, avoiding high protein meals with Sinemet, use of alarm to not miss medication doses.  Also discussed pt's motor fluctuations in regards to journaling to discuss with physician at next visit.  PT Education - 09/14/15 2058    Education provided Yes   Education Details HEP-ant/post weigthshifting, stagger stance weightshifting using marching for improved initiation of movement; medication management, journaling symptoms   Person(s) Educated Patient   Methods Explanation;Demonstration;Handout   Comprehension Verbalized understanding;Returned demonstration;Need further instruction;Verbal cues required          PT  Short Term Goals - 09/07/15 1636    PT SHORT TERM GOAL #1   Title Pt will verbalize understanding of fall prevention within the home environment.  TARGET 09/23/15   Time 2   Period Weeks   Status New   PT SHORT TERM GOAL #2   Title Pt will verbalize/demonstrate understanding of tips to prevent freezing with gait and turns.   Time 4   Period Weeks   Status New   PT SHORT TERM GOAL #3   Title Pt will perform at least 8 of 10 reps of sit<>stand transfers from 18 inch surfaces or lower with no posterior lean or loss of balance, for improved safety/efficiency with transfers.   Time 4   Period Weeks   Status New           PT Long Term Goals - 09/07/15 1638    PT LONG TERM GOAL #1   Title Pt will be independent with HEP, including LSVT BIG exercises.  TARGET 11/21/15   Time 6   Period Weeks   Status New   PT LONG TERM GOAL #2   Title Pt will improve Functional Gait Assessment score to at least 18/30 for decreased risk of falls.   Time 6   Period Weeks   Status New   PT LONG TERM GOAL #3   Title Pt will improve 4-square step test score to less than or equal to 12 seconds with no LOB for decreased fall risk.   Time 6   Period Weeks   Status New   PT LONG TERM GOAL #4   Title Pt will report at least 25% reduction in falls over 4 week period for improved balance/decreased falling frequency.   Time 6   Period Weeks   Status New               Plan - 09/14/15 2108    Clinical Impression Statement Spent time in today's session reviewing HEP from last visit and focused on deliberate, paced movements, including marching/stomping for increased intensity of movement to break out of a freezing pattern.  Pt needs frequent cues as reminders for deliberate and paced movements.  Pt will continue to benefit from skilled PT to address balance, posture, gait.   Pt will benefit from skilled therapeutic intervention in order to improve on the following deficits Abnormal gait;Decreased  balance;Decreased mobility;Decreased safety awareness;Decreased coordination;Decreased strength;Difficulty walking   Rehab Potential Good   PT Frequency 2x / week   PT Duration 2 weeks  then 4x/wk 4 wks LSVT BIG   PT Treatment/Interventions ADLs/Self Care Home Management;Gait training;Functional mobility training;Therapeutic activities;Therapeutic exercise;Neuromuscular re-education;Balance training;Patient/family education   PT Next Visit Plan Fall prevention, review tips to reduce freezing with gait and turns, safe/proper transfer technique, weightshifting and turns   Consulted and Agree with Plan of Care Patient        Problem List Patient Active Problem List   Diagnosis Date Noted  . UTI (lower urinary tract infection) 09/15/2014  . Acute kidney injury (HCC) 09/15/2014  . Acute encephalopathy 09/15/2014  . Orthostatic hypotension 09/15/2014  . Hypoglycemia 09/15/2014  . Frequent falls   .  Renal insufficiency 09/14/2014  . Parkinson's disease (tremor, stiffness, slow motion, unstable posture) (HCC) 05/15/2013  . 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 Diastolic dysfunction 03/05/2012  . Hyperlipidemia 03/05/2012  . Chest pain 03/04/2012  . Generalized anxiety disorder 03/04/2012  . HTN (hypertension) 03/04/2012  . Depression 03/04/2012    Melvin Marmo W. 09/14/2015, 9:14 PM Gean Maidens., PT [facility] [address] [place], [place], [address] Phone: [phone]   Fax:  [phone]  Name: Wendy Reynolds MRN: [id number] Date of Birth: 12/06/1940

## 2015-09-15 ENCOUNTER — Ambulatory Visit: Payer: Commercial Managed Care - HMO | Admitting: Physical Therapy

## 2015-09-15 DIAGNOSIS — R2689 Other abnormalities of gait and mobility: Secondary | ICD-10-CM

## 2015-09-15 DIAGNOSIS — R29898 Other symptoms and signs involving the musculoskeletal system: Secondary | ICD-10-CM | POA: Diagnosis not present

## 2015-09-15 DIAGNOSIS — R293 Abnormal posture: Secondary | ICD-10-CM | POA: Diagnosis not present

## 2015-09-15 DIAGNOSIS — R269 Unspecified abnormalities of gait and mobility: Secondary | ICD-10-CM | POA: Diagnosis not present

## 2015-09-16 NOTE — Therapy (Signed)
[facility] [address] [place], [place], [address] Phone: [phone]   Fax:  [phone]  Physical Therapy Treatment  Patient Details  Name: Wendy Reynolds MRN: [id number] Date of Birth: 1941-05-24 Referring Provider: Wynema Birch  Encounter Date: 09/15/2015      PT End of Session - 09/16/15 0755    Visit Number 4   Number of Visits 21   Date for PT Re-Evaluation 11/07/15   Authorization Type [employer]- No Auth required   PT Start Time 0804   PT Stop Time 0845   PT Time Calculation (min) 41 min   Equipment Utilized During Treatment Gait belt   Activity Tolerance Patient tolerated treatment well   Behavior During Therapy [facility] for tasks assessed/performed      Past Medical History  Diagnosis Date  . Panic   . Hypertension   . Depression   . Generalized anxiety disorder 03/04/2012  . Alcohol use (HCC)   . Diastolic dysfunction 03/05/2012    Grade 1. EF 60%  . Chest pain 03/04/2012    MI ruled out. Likely anxiety related.  . Hyperlipidemia 03/05/2012  . Parkinson disease (HCC)   . Hypothyroid   . Parkinson's disease [facility])     Past Surgical History  Procedure Laterality Date  . Dilation and curettage of uterus      There were no vitals filed for this visit.  Visit Diagnosis:  Postural instability  Muscle rigidity  Festinating gait      Subjective Assessment - 09/15/15 0806    Subjective Felt better yesterday after therapy, was able to do some horse activities at the barn without falling, even moving side to side without falling; did have one fall last night getting something out of the refrigerator.   Patient Stated Goals Pt's goal for therapy is to have less falls and to not freeze up so much with walking.   Currently in Pain? No/denies                         [facility] Adult PT Treatment/Exercise - 09/15/15 0810    High Level Balance   High Level Balance Activities Side  stepping;Backward walking  Forward/back at counter, 4-6 reps, cues for weight-shift   High Level Balance Comments At mat and counter:  lateral weigthshifting/marching x 10 reps, anterior/posterior stagger stance weightshifting x 10, then marching ant/post stagger stance position with increased intension 2 sets x 10 reps; reviewed anterior/posterior hip strategy active movement 2 x 10 reps.  At counter, side step and weigthshift 2 sets x 10 with UE support, use of obstacle first set of 10 with min guard assist.  Posterior step and weigthshift 2 sets x 10 reps with min assist for weigthshift and cues for upright posture in standing with pacing.   Self-Care   Self-Care Other Self-Care Comments   Other Self-Care Comments  discussed energy conservation and fatigue affecting mobility.   Therapeutic Activites    Therapeutic Activities ADL's   ADL's At end of session, worked on pt's reported c/o having difficulty/losing balance/sometimes falling when trying to get things out of the refrigerator.  Broke the action into multiple parts for completion:  walking up to the refrigerator ending with stagger stance to open door, taking 2-3 big steps up to get out needed item, taking 2-3 big steps back to close door, rock/weightshift to turn and leave the refrigerator area.  Blocked practice of this activity x 5 reps  with gradually less cueing and min guard>supervision, no LOB and no freezing episode noted.                PT Education - 09/16/15 0754    Education provided Yes   Education Details Review of HEP; need to focus on fatigue/energy level/rest as needed to avoid fluctuations in mobility; safe way to get items in/out of fridge   Person(s) Educated Patient   Methods Explanation;Demonstration;Verbal cues   Comprehension Verbalized understanding;Returned demonstration;Verbal cues required          PT Short Term Goals - 09/07/15 1636    PT SHORT TERM GOAL #1   Title Pt will verbalize understanding  of fall prevention within the home environment.  TARGET 09/23/15   Time 2   Period Weeks   Status New   PT SHORT TERM GOAL #2   Title Pt will verbalize/demonstrate understanding of tips to prevent freezing with gait and turns.   Time 4   Period Weeks   Status New   PT SHORT TERM GOAL #3   Title Pt will perform at least 8 of 10 reps of sit<>stand transfers from 18 inch surfaces or lower with no posterior lean or loss of balance, for improved safety/efficiency with transfers.   Time 4   Period Weeks   Status New           PT Long Term Goals - 09/07/15 1638    PT LONG TERM GOAL #1   Title Pt will be independent with HEP, including LSVT BIG exercises.  TARGET 11/21/15   Time 6   Period Weeks   Status New   PT LONG TERM GOAL #2   Title Pt will improve Functional Gait Assessment score to at least 18/30 for decreased risk of falls.   Time 6   Period Weeks   Status New   PT LONG TERM GOAL #3   Title Pt will improve 4-square step test score to less than or equal to 12 seconds with no LOB for decreased fall risk.   Time 6   Period Weeks   Status New   PT LONG TERM GOAL #4   Title Pt will report at least 25% reduction in falls over 4 week period for improved balance/decreased falling frequency.   Time 6   Period Weeks   Status New               Plan - 09/16/15 0756    Clinical Impression Statement Skilled session today focused on continued practice of weigthshifting, stepping/weigthshifting activities, varied direction walking at counter with pacing cues, finishing session with activity of getting items in and out of fridge safely and efficiently.  Pt overall reports she is doing better functionally, especially after therapy/exercise, but she is continuing to have several falls.  Pt will continue to benefit from skilled PT to address balance, posture, gait and functional activities.   Pt will benefit from skilled therapeutic intervention in order to improve on the following  deficits Abnormal gait;Decreased balance;Decreased mobility;Decreased safety awareness;Decreased coordination;Decreased strength;Difficulty walking   Rehab Potential Good   PT Frequency 2x / week   PT Duration 2 weeks  then 4x/wk 4 wks LSVT BIG   PT Treatment/Interventions ADLs/Self Care Home Management;Gait training;Functional mobility training;Therapeutic activities;Therapeutic exercise;Neuromuscular re-education;Balance training;Patient/family education   PT Next Visit Plan Fall prevention, continue to review tips to reduce freezing with gait and turns, safe/proper transfer technique, weightshifting and turns; TRY PWR! MOVES in QUADRUPED NEXT VISIT  Consulted and Agree with Plan of Care Patient        Problem List Patient Active Problem List   Diagnosis Date Noted  . UTI (lower urinary tract infection) 09/15/2014  . Acute kidney injury (HCC) 09/15/2014  . Acute encephalopathy 09/15/2014  . Orthostatic hypotension 09/15/2014  . Hypoglycemia 09/15/2014  . Frequent falls   . Renal insufficiency 09/14/2014  . Parkinson's disease (tremor, stiffness, slow motion, unstable posture) (HCC) 05/15/2013  . 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 Diastolic dysfunction 03/05/2012  . Hyperlipidemia 03/05/2012  . Chest pain 03/04/2012  . Generalized anxiety disorder 03/04/2012  . HTN (hypertension) 03/04/2012  . Depression 03/04/2012    Tikesha Mort W. 09/16/2015, 8:00 AM  Gean Maidens., PT  [place] [facility] [address] [place], [place], [address] Phone: [phone]   Fax:  [phone]  Name: JAMIAYA BINA MRN: [id number] Date of Birth: August 24, 1940

## 2015-09-16 NOTE — Patient Instructions (Signed)
Instructed patient in safe technique for getting items in/out of refrigerator.

## 2015-09-19 ENCOUNTER — Ambulatory Visit: Payer: Commercial Managed Care - HMO | Admitting: *Deleted

## 2015-09-21 ENCOUNTER — Ambulatory Visit: Payer: Commercial Managed Care - HMO | Admitting: Physical Therapy

## 2015-09-21 DIAGNOSIS — R2689 Other abnormalities of gait and mobility: Secondary | ICD-10-CM

## 2015-09-21 DIAGNOSIS — R293 Abnormal posture: Secondary | ICD-10-CM | POA: Diagnosis not present

## 2015-09-21 DIAGNOSIS — R269 Unspecified abnormalities of gait and mobility: Secondary | ICD-10-CM

## 2015-09-21 DIAGNOSIS — R29898 Other symptoms and signs involving the musculoskeletal system: Secondary | ICD-10-CM | POA: Diagnosis not present

## 2015-09-21 NOTE — Therapy (Signed)
[facility] [address] [place], [place], [address] Phone: [phone]   Fax:  [phone]  Physical Therapy Treatment  Patient Details  Name: Wendy Reynolds MRN: [id number] Date of Birth: 1940-11-04 Referring Provider: Wynema Birch  Encounter Date: 09/21/2015      PT End of Session - 09/21/15 2317    Visit Number 5   Number of Visits 21   Date for PT Re-Evaluation 11/07/15   Authorization Type [employer]- No Auth required   PT Start Time 1450   PT Stop Time 1528   PT Time Calculation (min) 38 min   Equipment Utilized During Treatment Gait belt   Activity Tolerance Patient tolerated treatment well   Behavior During Therapy [facility] for tasks assessed/performed      Past Medical History  Diagnosis Date  . Panic   . Hypertension   . Depression   . Generalized anxiety disorder 03/04/2012  . Alcohol use (HCC)   . Diastolic dysfunction 03/05/2012    Grade 1. EF 60%  . Chest pain 03/04/2012    MI ruled out. Likely anxiety related.  . Hyperlipidemia 03/05/2012  . Parkinson disease (HCC)   . Hypothyroid   . Parkinson's disease [facility])     Past Surgical History  Procedure Laterality Date  . Dilation and curettage of uterus      There were no vitals filed for this visit.  Visit Diagnosis:  Postural instability  Festinating gait  Abnormality of gait  Muscle rigidity      Subjective Assessment - 09/21/15 1450    Subjective Had some freezing episodes forward and falling sideways at the barn.  Had at least 3 falls yesterday.   Patient Stated Goals Pt's goal for therapy is to have less falls and to not freeze up so much with walking.   Currently in Pain? No/denies                         [facility] Adult PT Treatment/Exercise - 09/21/15 0001    Ambulation/Gait   Ambulation/Gait Yes   Ambulation/Gait Assistance 5: Supervision   Ambulation Distance (Feet) 230 Feet  x 2   Assistive device None    Gait Pattern Step-through pattern;Decreased arm swing - right;Decreased arm swing - left;Decreased step length - right;Decreased step length - left;Decreased trunk rotation   Gait Comments Cues for increased reciprocal arm swing, with starts/stops with gait, with cues for stopping with widened BOS.   High Level Balance   High Level Balance Activities Turns;Figure 8 turns;Weight-shifting turns  Wide-U turns, multiple reps each   High Level Balance Comments Review of turns, with additional practice with figure-8 and U turns.  With weigthshifting turns, pt requires cues for slowed pacing and for increased intensity of lifting foot for foot clearance.           P[facility]) - 09/21/15 2311    PWR! Up x 10   PWR! Rock x 10   PWR! Twist x 10   PWR! Step x 10   Comments PWR! Moves performed in quadruped and in modified quadruped as above, with verbal and visual cues.  Pt notes improvement in stiffness after performance of exercises.             PT Education - 09/21/15 2317    Education provided Yes   Education Details Pacing, slowed deliberate movement; need to focus on task at hand/decrease dual tasking as much as possible.  Person(s) Educated Patient   Methods Explanation;Demonstration   Comprehension Verbalized understanding;Returned demonstration;Need further instruction          PT Short Term Goals - 09/07/15 1636    PT SHORT TERM GOAL #1   Title Pt will verbalize understanding of fall prevention within the home environment.  TARGET 09/23/15   Time 2   Period Weeks   Status New   PT SHORT TERM GOAL #2   Title Pt will verbalize/demonstrate understanding of tips to prevent freezing with gait and turns.   Time 4   Period Weeks   Status New   PT SHORT TERM GOAL #3   Title Pt will perform at least 8 of 10 reps of sit<>stand transfers from 18 inch surfaces or lower with no posterior lean or loss of balance, for improved safety/efficiency with transfers.   Time 4   Period  Weeks   Status New           PT Long Term Goals - 09/07/15 1638    PT LONG TERM GOAL #1   Title Pt will be independent with HEP, including LSVT BIG exercises.  TARGET 11/21/15   Time 6   Period Weeks   Status New   PT LONG TERM GOAL #2   Title Pt will improve Functional Gait Assessment score to at least 18/30 for decreased risk of falls.   Time 6   Period Weeks   Status New   PT LONG TERM GOAL #3   Title Pt will improve 4-square step test score to less than or equal to 12 seconds with no LOB for decreased fall risk.   Time 6   Period Weeks   Status New   PT LONG TERM GOAL #4   Title Pt will report at least 25% reduction in falls over 4 week period for improved balance/decreased falling frequency.   Time 6   Period Weeks   Status New               Plan - 09/21/15 2318    Clinical Impression Statement Review/addressed turns and gait activities today per patient request.  She is continuing to have freezing episodes, difficulty with initiation, and some falls.  Pt noted to have difficulty with conversation tasks while turning, with increased festination.  Pt responds well to cues for pacing and intensity during therapy, but pt appears to be having difficulty with carryover on her own (Daughter only able to provide verbal cues at home as she is a quadriplegic).  Pt will continue to benefit from further skilled PT to address transfers, initiation of gait and balance.   Pt will benefit from skilled therapeutic intervention in order to improve on the following deficits Abnormal gait;Decreased balance;Decreased mobility;Decreased safety awareness;Decreased coordination;Decreased strength;Difficulty walking   Rehab Potential Good   PT Frequency 2x / week   PT Duration 2 weeks  then 4x/wk 4 wks LSVT BIG   PT Treatment/Interventions ADLs/Self Care Home Management;Gait training;Functional mobility training;Therapeutic activities;Therapeutic exercise;Neuromuscular re-education;Balance  training;Patient/family education   PT Next Visit Plan Fall prevention, continue to review tips to reduce freezing with gait and turns, safe/proper transfer technique, weightshifting and turns; TRY PWR! MOVES in QUADRUPED NEXT VISIT   Consulted and Agree with Plan of Care Patient        Problem List Patient Active Problem List   Diagnosis Date Noted  . UTI (lower urinary tract infection) 09/15/2014  . Acute kidney injury (HCC) 09/15/2014  . Acute encephalopathy 09/15/2014  . Orthostatic  hypotension 09/15/2014  . Hypoglycemia 09/15/2014  . Frequent falls   . Renal insufficiency 09/14/2014  . Parkinson's disease (tremor, stiffness, slow motion, unstable posture) (HCC) 05/15/2013  . 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 Diastolic dysfunction 03/05/2012  . Hyperlipidemia 03/05/2012  . Chest pain 03/04/2012  . Generalized anxiety disorder 03/04/2012  . HTN (hypertension) 03/04/2012  . Depression 03/04/2012    MARRIOTT,AMY W. 09/21/2015, 11:21 PM Gean Maidens., PT [place] [facility] [address] [place], [place], [address] Phone: [phone]   Fax:  [phone]  Name: Wendy Reynolds MRN: [id number] Date of Birth: August 17, 1940

## 2015-09-22 ENCOUNTER — Ambulatory Visit: Payer: Commercial Managed Care - HMO | Admitting: Physical Therapy

## 2015-09-22 DIAGNOSIS — R293 Abnormal posture: Secondary | ICD-10-CM

## 2015-09-22 DIAGNOSIS — R2689 Other abnormalities of gait and mobility: Secondary | ICD-10-CM | POA: Diagnosis not present

## 2015-09-22 DIAGNOSIS — R29898 Other symptoms and signs involving the musculoskeletal system: Secondary | ICD-10-CM | POA: Diagnosis not present

## 2015-09-22 DIAGNOSIS — R269 Unspecified abnormalities of gait and mobility: Secondary | ICD-10-CM | POA: Diagnosis not present

## 2015-09-22 NOTE — Therapy (Signed)
[facility] [address] [place], [place], [address] Phone: [phone]   Fax:  [phone]  Physical Therapy Treatment  Patient Details  Name: Wendy Reynolds MRN: [id number] Date of Birth: 1940/09/09 Referring Provider: Sonia Baller  Encounter Date: 09/22/2015      PT End of Session - 09/22/15 0943    Visit Number 6   Number of Visits 21   Date for PT Re-Evaluation 11/07/15   Authorization Type [employer]- No Auth required   PT Start Time 0809   PT Stop Time 0848   PT Time Calculation (min) 39 min   Activity Tolerance Patient tolerated treatment well   Behavior During Therapy [facility] for tasks assessed/performed      Past Medical History  Diagnosis Date  . Panic   . Hypertension   . Depression   . Generalized anxiety disorder 03/04/2012  . Alcohol use ([place])   . Diastolic dysfunction 02/28/4234    Grade 1. EF 60%  . Chest pain 03/04/2012    MI ruled out. Likely anxiety related.  . Hyperlipidemia 03/05/2012  . Parkinson disease ([place])   . Hypothyroid   . Parkinson's disease [facility])     Past Surgical History  Procedure Laterality Date  . Dilation and curettage of uterus      There were no vitals filed for this visit.  Visit Diagnosis:  Postural instability  Muscle rigidity      Subjective Assessment - 09/22/15 0811    Subjective No falls, no freezing last night since PT yesterday-felt much better after PT yesterday (pt arrives late-traffic)   Patient Stated Goals Pt's goal for therapy is to have less falls and to not freeze up so much with walking.   Currently in Pain? No/denies                         [facility] Adult PT Treatment/Exercise - 09/22/15 0937    Ambulation/Gait   Ambulation/Gait Yes   Ambulation/Gait Assistance 5: Supervision   Ambulation Distance (Feet) 330 Feet   Assistive device None   Gait Pattern Step-through pattern;Decreased arm swing - right;Decreased arm swing -  left;Decreased step length - right;Decreased step length - left;Decreased trunk rotation   Pre-Gait Activities Several start/stops with gait with no festination noted.   Gait Comments Cues for improved reciprocal arm swing, increased step length.     Self-Care   Self-Care Other Self-Care Comments   Other Self-Care Comments  Discussed pt's upcoming LSVT BIG sessions, begining next week.  Discussed protocol, expectations for therapy and homework/carryover tasks.  Discussed utilization of PWR! quadruped technique for improved flexibility and dissociation of trunk/pelvis for improved mobility.  Discussed fall prevention, specifically:  SLOWED, PACING WITH GAIT, TAKING DELIBERATE MOVEMENTS, AND PLANNING MOVEMENTS.  DISCUSSED AVOIDING DUAL TASKING/DELEGATING HOUSEHOLD TASKS AS ABLE TO DAUGHTER.           PWR ([place]) - 09/22/15 3614    PWR! Up x 15   PWR! Rock x 15   PWR! Twist x 10   PWR! Step x 10  cues for big, deliberate step to avoid festinating steps    Comments PWR! Moves performed in quadruped and modified quadruped as noted above with verbal/visual cues.  Provided as HEP to help alleviate stiffness/ridigity.             PT Education - 09/22/15 [phone]    Education provided Yes   Education Details HEP additions (quadruped PWR! and modified  quadruped PWR!), fall prevention, expectations for LSVT BIG part of therapy starting next week   Person(s) Educated Patient   Methods Explanation;Demonstration;Handout   Comprehension Verbalized understanding;Returned demonstration;Verbal cues required;Need further instruction          PT Short Term Goals - 09/22/15 0945    PT SHORT TERM GOAL #1   Title Pt will verbalize understanding of fall prevention within the home environment.  TARGET 09/23/15   Time 2   Period Weeks   Status Achieved   PT SHORT TERM GOAL #2   Title Pt will verbalize/demonstrate understanding of tips to prevent freezing with gait and turns.   Time 4   Period Weeks    Status Partially Met   PT SHORT TERM GOAL #3   Title Pt will perform at least 8 of 10 reps of sit<>stand transfers from 18 inch surfaces or lower with no posterior lean or loss of balance, for improved safety/efficiency with transfers.   Time 4   Period Weeks   Status On-going           PT Long Term Goals - 09/07/15 1638    PT LONG TERM GOAL #1   Title Pt will be independent with HEP, including LSVT BIG exercises.  TARGET 11/21/15   Time 6   Period Weeks   Status New   PT LONG TERM GOAL #2   Title Pt will improve Functional Gait Assessment score to at least 18/30 for decreased risk of falls.   Time 6   Period Weeks   Status New   PT LONG TERM GOAL #3   Title Pt will improve 4-square step test score to less than or equal to 12 seconds with no LOB for decreased fall risk.   Time 6   Period Weeks   Status New   PT LONG TERM GOAL #4   Title Pt will report at least 25% reduction in falls over 4 week period for improved balance/decreased falling frequency.   Time 6   Period Weeks   Status New               Plan - 09/22/15 0944    Clinical Impression Statement Skilled session today focused on quadruped [facility] (PWR!) moves to alleviate rigidity, improve deliberate movement and improve trunk flexibility and posture.  Pt voices noted improvement after therapy activities, especially after yesterday and today's exercises in session.  Pt has met STG #1 an partially met STG #2 (reports trying to implement tips to reduce freezing at home, but is harder to do [place] the horse [place]  Pt reports 50% reduction in falls since beginning PT.  Pt will begin LSVT BIG portion of therapy session next week.   Pt will benefit from skilled therapeutic intervention in order to improve on the following deficits Abnormal gait;Decreased balance;Decreased mobility;Decreased safety awareness;Decreased coordination;Decreased strength;Difficulty walking   Rehab Potential Good   PT  Frequency 2x / week   PT Duration 2 weeks  then 4x/wk 4 wks LSVT BIG   PT Treatment/Interventions ADLs/Self Care Home Management;Gait training;Functional mobility training;Therapeutic activities;Therapeutic exercise;Neuromuscular re-education;Balance training;Patient/family education   PT Next Visit Plan LSVT BIG exercises, functional/carryover tasks.  check STG #3 for transfers   Consulted and Agree with Plan of Care Patient        Problem List Patient Active Problem List   Diagnosis Date Noted  . UTI (lower urinary tract infection) 09/15/2014  . Acute kidney injury ([place]) 09/15/2014  . Acute encephalopathy 09/15/2014  .  Orthostatic hypotension 09/15/2014  . Hypoglycemia 09/15/2014  . Frequent falls   . Renal insufficiency 09/14/2014  . Parkinson's disease (tremor, stiffness, slow motion, unstable posture) ([place]) 05/15/2013  . 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 Diastolic dysfunction 26/02/8834  . Hyperlipidemia 03/05/2012  . Chest pain 03/04/2012  . Generalized anxiety disorder 03/04/2012  . HTN (hypertension) 03/04/2012  . Depression 03/04/2012    MARRIOTT,AMY W. 09/22/2015, 9:48 AM Frazier Butt., PT  [facility] [address] [place], [place], [address] Phone: [phone]   Fax:  [phone]  Name: Wendy Reynolds MRN: [id number] Date of Birth: 01/20/1941

## 2015-09-22 NOTE — Patient Instructions (Signed)
Provided patient with PWR! Handouts for quadruped and modified quadruped position -15-20 reps 1-2 times per day to help alleviate stiffness, improve flexibility and functional mobility

## 2015-09-27 ENCOUNTER — Ambulatory Visit: Payer: Commercial Managed Care - HMO | Admitting: Physical Therapy

## 2015-09-27 DIAGNOSIS — R293 Abnormal posture: Secondary | ICD-10-CM

## 2015-09-27 DIAGNOSIS — R269 Unspecified abnormalities of gait and mobility: Secondary | ICD-10-CM

## 2015-09-27 DIAGNOSIS — R2689 Other abnormalities of gait and mobility: Secondary | ICD-10-CM

## 2015-09-27 DIAGNOSIS — R29898 Other symptoms and signs involving the musculoskeletal system: Secondary | ICD-10-CM | POA: Diagnosis not present

## 2015-09-27 NOTE — Therapy (Signed)
[facility] [address] [place], [place], [address] Phone: [phone]   Fax:  [phone]  Physical Therapy Treatment  Patient Details  Name: Wendy Reynolds MRN: [id number] Date of Birth: Feb 19, 1941 Referring Provider: Sonia Baller  Encounter Date: 09/27/2015      PT End of Session - 09/27/15 1526    Visit Number 7   Number of Visits 21   Date for PT Re-Evaluation 11/07/15   Authorization Type [employer]- No Auth required   PT Start Time 0745   PT Stop Time 0845   PT Time Calculation (min) 60 min   Activity Tolerance Patient tolerated treatment well   Behavior During Therapy [facility] for tasks assessed/performed      Past Medical History  Diagnosis Date  . Panic   . Hypertension   . Depression   . Generalized anxiety disorder 03/04/2012  . Alcohol use ([place])   . Diastolic dysfunction 11/02/6254    Grade 1. EF 60%  . Chest pain 03/04/2012    MI ruled out. Likely anxiety related.  . Hyperlipidemia 03/05/2012  . Parkinson disease ([place])   . Hypothyroid   . Parkinson's disease [facility])     Past Surgical History  Procedure Laterality Date  . Dilation and curettage of uterus      There were no vitals filed for this visit.  Visit Diagnosis:  Postural instability  Festinating gait  Abnormality of gait      Subjective Assessment - 09/27/15 0747    Subjective No falls since last visit-paid more attention to how I was moving and slowed down.   Currently in Pain? No/denies                         [facility] Adult PT Treatment/Exercise - 09/27/15 0001    Ambulation/Gait   Ambulation/Gait Yes  BIG walking end of session   Ambulation/Gait Assistance 5: Supervision   Ambulation/Gait Assistance Details Cues for large amplitude gait pattern, including step length, arm swing and posture   Ambulation Distance (Feet) 230 Feet  x 2   Assistive device None   Gait Pattern Step-through pattern  widened  BOS and foot clearance            LSVT [facility]) - 09/27/15 0747    Floor to ceiling Other reps  8; 10 flicks last rep   Side to side Other reps  8; 10 flicks last rep   Step and Reach Forward Other reps (comment)  8 at chair; therapist support due to posterior lean   Step and Reach Backward Other reps (comment)  8 reps; with UE support of chair   Step and Reach Sideways Other reps (comment)  8; at chair for support   Rock and Reach Forward/Backward Other reps (comment)  8; at chair for 1 UE support   Rock and [employer] Other reps (comment)  8; therapist manual and verbal cues for safety/balance   1 - Sit to stand 10 reps;Other reps (comment)  each surface 20", 18", 16"   Other Tasks 1 Big side- step partial turn to sit to lower chair  4 reps to assist with initiating steps/change of direction     PT provides verbal, visual, and tactile cues for amplitude of movement.  With sustained movements, pt holds each rep for 10 seconds; for step and reach exercises, pt requires assistance and cues for widened BOS and deliberate movement to maintain balance and prevent posterior  loss of balance in static standing, with use of UE support at chair.         PT Short Term Goals - 09/22/15 0945    PT SHORT TERM GOAL #1   Title Pt will verbalize understanding of fall prevention within the home environment.  TARGET 09/23/15   Time 2   Period Weeks   Status Achieved   PT SHORT TERM GOAL #2   Title Pt will verbalize/demonstrate understanding of tips to prevent freezing with gait and turns.   Time 4   Period Weeks   Status Partially Met   PT SHORT TERM GOAL #3   Title Pt will perform at least 8 of 10 reps of sit<>stand transfers from 18 inch surfaces or lower with no posterior lean or loss of balance, for improved safety/efficiency with transfers.   Time 4   Period Weeks   Status On-going           PT Long Term Goals - 09/07/15 1638    PT LONG TERM GOAL #1   Title Pt will be  independent with HEP, including LSVT BIG exercises.  TARGET 11/21/15   Time 6   Period Weeks   Status New   PT LONG TERM GOAL #2   Title Pt will improve Functional Gait Assessment score to at least 18/30 for decreased risk of falls.   Time 6   Period Weeks   Status New   PT LONG TERM GOAL #3   Title Pt will improve 4-square step test score to less than or equal to 12 seconds with no LOB for decreased fall risk.   Time 6   Period Weeks   Status New   PT LONG TERM GOAL #4   Title Pt will report at least 25% reduction in falls over 4 week period for improved balance/decreased falling frequency.   Time 6   Period Weeks   Status New               Plan - 09/27/15 1527    Clinical Impression Statement Initiated LSVT BIG exercises, including sustained movements, step and reach exercises and standing weightshifting exercises as well as functional activities of sit<>stand and turning to sit.  Pt requires use of UE support at chair for standing exercises due to tendency for posterior loss of balance upon static standing.  PT provided visual, verbal, and tactile cues for modeling to improve amplitude and performance of exercises.     Pt will benefit from skilled therapeutic intervention in order to improve on the following deficits Abnormal gait;Decreased balance;Decreased mobility;Decreased safety awareness;Decreased coordination;Decreased strength;Difficulty walking   Rehab Potential Good   PT Frequency 2x / week   PT Duration 2 weeks  then 4x/wk 4 wks LSVT BIG   PT Treatment/Interventions ADLs/Self Care Home Management;Gait training;Functional mobility training;Therapeutic activities;Therapeutic exercise;Neuromuscular re-education;Balance training;Patient/family education   PT Next Visit Plan LSVT BIG exercises, functional/carryover tasks- provided written copies of HEP   Consulted and Agree with Plan of Care Patient        Problem List Patient Active Problem List   Diagnosis Date  Noted  . UTI (lower urinary tract infection) 09/15/2014  . Acute kidney injury ([place]) 09/15/2014  . Acute encephalopathy 09/15/2014  . Orthostatic hypotension 09/15/2014  . Hypoglycemia 09/15/2014  . Frequent falls   . Renal insufficiency 09/14/2014  . Parkinson's disease (tremor, stiffness, slow motion, unstable posture) ([place]) 05/15/2013  . 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  Diastolic dysfunction 11/88/6773  . Hyperlipidemia 03/05/2012  . Chest pain 03/04/2012  . Generalized anxiety disorder 03/04/2012  . HTN (hypertension) 03/04/2012  . Depression 03/04/2012    Eimi Viney W. 09/27/2015, 3:30 PM Frazier Butt., PT [facility] [address] [place], [place], [address] Phone: [phone]   Fax:  [phone]  Name: AMYLA HEFFNER MRN: [id number] Date of Birth: June 27, 1941

## 2015-09-28 ENCOUNTER — Ambulatory Visit: Payer: Commercial Managed Care - HMO | Attending: Neurology | Admitting: Physical Therapy

## 2015-09-28 DIAGNOSIS — R2681 Unsteadiness on feet: Secondary | ICD-10-CM | POA: Insufficient documentation

## 2015-09-28 DIAGNOSIS — R29898 Other symptoms and signs involving the musculoskeletal system: Secondary | ICD-10-CM

## 2015-09-28 DIAGNOSIS — R293 Abnormal posture: Secondary | ICD-10-CM | POA: Diagnosis not present

## 2015-09-28 DIAGNOSIS — R269 Unspecified abnormalities of gait and mobility: Secondary | ICD-10-CM | POA: Insufficient documentation

## 2015-09-28 DIAGNOSIS — R2689 Other abnormalities of gait and mobility: Secondary | ICD-10-CM

## 2015-09-28 NOTE — Therapy (Signed)
[facility] [address] [place], [place], [address] Phone: [phone]   Fax:  [phone]  Physical Therapy Treatment  Patient Details  Name: Wendy Reynolds MRN: [id number] Date of Birth: 1940-08-08 Referring Provider: Sonia Baller  Encounter Date: 09/28/2015      PT End of Session - 09/28/15 0929    Visit Number 8   Number of Visits 21   Date for PT Re-Evaluation 11/07/15   Authorization Type [employer]- No Auth required   PT Start Time 0820   PT Stop Time 0922   PT Time Calculation (min) 62 min   Activity Tolerance Patient tolerated treatment well   Behavior During Therapy [facility] for tasks assessed/performed      Past Medical History  Diagnosis Date  . Panic   . Hypertension   . Depression   . Generalized anxiety disorder 03/04/2012  . Alcohol use ([place])   . Diastolic dysfunction 11/02/6597    Grade 1. EF 60%  . Chest pain 03/04/2012    MI ruled out. Likely anxiety related.  . Hyperlipidemia 03/05/2012  . Parkinson disease ([place])   . Hypothyroid   . Parkinson's disease [facility])     Past Surgical History  Procedure Laterality Date  . Dilation and curettage of uterus      There were no vitals filed for this visit.  Visit Diagnosis:  Postural instability  Muscle rigidity  Festinating gait  Abnormality of gait      Subjective Assessment - 09/28/15 0821    Subjective Very tired this morning(had daughter's birthday party last night, trying to get her ready for MD appt this morniing)-also fell going into the bathroom this morning; freezing a lot today   Currently in Pain? No/denies                         [facility] Adult PT Treatment/Exercise - 09/28/15 0924    Ambulation/Gait   Ambulation/Gait Yes  BIG walking   Ambulation/Gait Assistance 5: Supervision   Ambulation/Gait Assistance Details Cues for large amplitude gait pattern-step length, arm swing, posture   Ambulation Distance  (Feet) 540 Feet  then 330   Assistive device None   Gait Pattern Step-through pattern  widened BOS and foot clearance, improved arm swing   Pre-Gait Activities Throughout session, multiple reps of cues for "best BIG posture" upon standing for glut activation and improved scapular activation to prevent posterior lean in standing.   Gait Comments Second bout of gait is carrying item in single hand, with cues for BIG walking pattern, including L arm swing while carrying item in R hand.  Discussed carryover for home, including carrying item in one hand only, especially with stair negotiation, to allow for 1 UE support with rail and to potentially decrease posterior lean and risk of falls with gait when carrying items in both hands.            LSVT [facility]) - 09/28/15 0824    Floor to ceiling Other reps  8; 10 flicks last rep   Side to side Other reps  8; 10 flicks last rep   Step and Reach Forward Other reps (comment)  8; therapist support to prevent posterior lean   Step and Reach Backward Other reps (comment)  with UE support of chair   Step and Reach Sideways Other reps (comment)  at chair for support   Rock and Reach Forward/Backward Other reps (comment)  8 reps, 1 UE  for support   Rock and [employer] Other reps (comment)  8, therapist assist for technique and balance   1 - Sit to stand 10 reps;Other reps (comment)  each, from 10", 18", 16" surfaces   Other Tasks 1 Big side step partial turn to sit  5 reps to initiate steps/change directions   Other Tasks 2 Sidestep at counter-BIG steps at counter with and without UE support, 5 reps each direction 8 ft each way   Other Tasks 3 stair negotiation 4 steps, 3 reps with 1 handrail, with step through pattern ascending, step-to pattern descending.  Cues for BIG foot placement for safety with stair negotiation.       Therapist provides verbal, visual, and tactile cues for modeling large amplitude movement patterns throughout PT  session.  Discussed/practiced pt's need for exaggerated/BIG movement, especially in LLE, in response to pt's report of LLE harder to move.  Discussed awareness of need for larger amplitude movement patterns for normal LLE movement.       PT Education - 09/28/15 0929    Education Details HEP additions-see instructions; need for patient to rest as needed, avoid excess fatigue and stressors (which exacerbate pt's freezing episodes), use of BIG movements in HEP and functional tasks.   Person(s) Educated Patient   Methods Explanation;Demonstration;Handout;Tactile cues;Verbal cues   Comprehension Verbalized understanding;Returned demonstration;Need further instruction;Verbal cues required          PT Short Term Goals - 09/22/15 0945    PT SHORT TERM GOAL #1   Title Pt will verbalize understanding of fall prevention within the home environment.  TARGET 09/23/15   Time 2   Period Weeks   Status Achieved   PT SHORT TERM GOAL #2   Title Pt will verbalize/demonstrate understanding of tips to prevent freezing with gait and turns.   Time 4   Period Weeks   Status Partially Met   PT SHORT TERM GOAL #3   Title Pt will perform at least 8 of 10 reps of sit<>stand transfers from 18 inch surfaces or lower with no posterior lean or loss of balance, for improved safety/efficiency with transfers.   Time 4   Period Weeks   Status On-going           PT Long Term Goals - 09/07/15 1638    PT LONG TERM GOAL #1   Title Pt will be independent with HEP, including LSVT BIG exercises.  TARGET 11/21/15   Time 6   Period Weeks   Status New   PT LONG TERM GOAL #2   Title Pt will improve Functional Gait Assessment score to at least 18/30 for decreased risk of falls.   Time 6   Period Weeks   Status New   PT LONG TERM GOAL #3   Title Pt will improve 4-square step test score to less than or equal to 12 seconds with no LOB for decreased fall risk.   Time 6   Period Weeks   Status New   PT LONG TERM GOAL  #4   Title Pt will report at least 25% reduction in falls over 4 week period for improved balance/decreased falling frequency.   Time 6   Period Weeks   Status New               Plan - 09/28/15 0930    Clinical Impression Statement Pt appears to have slightly improved coordination of movements with exercises today, still needing UE support of chair for standing exercises.  Also, pt requires verbal cues as reminders throughout session for best BIG posture for muscle activation to prevent posterior lean.  Pt is progressing with LSVT  BIG protocol.   Pt will benefit from skilled therapeutic intervention in order to improve on the following deficits Abnormal gait;Decreased balance;Decreased mobility;Decreased safety awareness;Decreased coordination;Decreased strength;Difficulty walking   Rehab Potential Good   PT Frequency 2x / week   PT Duration 2 weeks  then 4x/wk 4 wks LSVT BIG   PT Treatment/Interventions ADLs/Self Care Home Management;Gait training;Functional mobility training;Therapeutic activities;Therapeutic exercise;Neuromuscular re-education;Balance training;Patient/family education   PT Next Visit Plan LSVT BIG exercises, functional/carryover tasks- provided written copies of HEP-progress as patient is able    Consulted and Agree with Plan of Care Patient        Problem List Patient Active Problem List   Diagnosis Date Noted  . UTI (lower urinary tract infection) 09/15/2014  . Acute kidney injury ([place]) 09/15/2014  . Acute encephalopathy 09/15/2014  . Orthostatic hypotension 09/15/2014  . Hypoglycemia 09/15/2014  . Frequent falls   . Renal insufficiency 09/14/2014  . Parkinson's disease (tremor, stiffness, slow motion, unstable posture) ([place]) 05/15/2013  . 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 Diastolic dysfunction 03/50/0938  . Hyperlipidemia 03/05/2012  . Chest pain 03/04/2012  . Generalized anxiety disorder 03/04/2012  .  HTN (hypertension) 03/04/2012  . Depression 03/04/2012    Frazier Butt. 09/28/2015, 10:14 AM  Frazier Butt PT  [facility] [address] [place], [place], [address] Phone: [phone]   Fax:  [phone]  Name: CARAL WHAN MRN: [id number] Date of Birth: [birth year]

## 2015-09-28 NOTE — Patient Instructions (Signed)
Provided patient with LSVT BIG based handouts for seated sustained exercises (floor to ceiling reach and side to side reach)  -8 reps, with 10 second hold  -1 time a day on therapy days, 2x/day on non-therapy days    Provided patient with LSVT BIG based handout on BIG sit to stand as functional component of HEP  -to perform 10 reps 1-2 times per day

## 2015-09-29 ENCOUNTER — Ambulatory Visit: Payer: Commercial Managed Care - HMO | Admitting: Physical Therapy

## 2015-09-29 DIAGNOSIS — R293 Abnormal posture: Secondary | ICD-10-CM

## 2015-09-29 DIAGNOSIS — R269 Unspecified abnormalities of gait and mobility: Secondary | ICD-10-CM | POA: Diagnosis not present

## 2015-09-29 DIAGNOSIS — R2681 Unsteadiness on feet: Secondary | ICD-10-CM | POA: Diagnosis not present

## 2015-09-29 DIAGNOSIS — R29898 Other symptoms and signs involving the musculoskeletal system: Secondary | ICD-10-CM | POA: Diagnosis not present

## 2015-09-29 DIAGNOSIS — R2689 Other abnormalities of gait and mobility: Secondary | ICD-10-CM

## 2015-09-29 NOTE — Therapy (Signed)
[facility] [address] [place], [place], [address] Phone: [phone]   Fax:  [phone]  Physical Therapy Treatment  Patient Details  Name: Wendy Reynolds MRN: [id number] Date of Birth: 02-Oct-1940 Referring Provider: Sonia Baller  Encounter Date: 09/29/2015      PT End of Session - 09/29/15 1301    Visit Number 9   Number of Visits 21   Date for PT Re-Evaluation 11/07/15   Authorization Type [employer]- No Auth required   PT Start Time 0748   PT Stop Time 0845   PT Time Calculation (min) 57 min   Activity Tolerance Patient tolerated treatment well   Behavior During Therapy [facility] for tasks assessed/performed      Past Medical History  Diagnosis Date  . Panic   . Hypertension   . Depression   . Generalized anxiety disorder 03/04/2012  . Alcohol use ([place])   . Diastolic dysfunction 0/01/6807    Grade 1. EF 60%  . Chest pain 03/04/2012    MI ruled out. Likely anxiety related.  . Hyperlipidemia 03/05/2012  . Parkinson disease ([place])   . Hypothyroid   . Parkinson's disease [facility])     Past Surgical History  Procedure Laterality Date  . Dilation and curettage of uterus      There were no vitals filed for this visit.  Visit Diagnosis:  Postural instability  Muscle rigidity  Festinating gait  Abnormality of gait      Subjective Assessment - 09/29/15 0749    Subjective Very tired this morning-had a few freezing episodes and a couple of falls yesterday.  Worked on the exercises at home.   Patient Stated Goals Pt's goal for therapy is to have less falls and to not freeze up so much with walking.   Currently in Pain? No/denies                         [facility] Adult PT Treatment/Exercise - 09/29/15 1305    Ambulation/Gait   Ambulation/Gait Yes  BIG walking at end of session   Ambulation/Gait Assistance 6: Modified independent (Device/Increase time)   Ambulation/Gait Assistance Details  Initial cues for large amplitude arm swing, foot clearance and step length with gait.   Ambulation Distance (Feet) 600 Feet  200   Assistive device None   Gait Pattern Step-through pattern  widened BOS and foot clearance, improved arm swing   Pre-Gait Activities Start/stop activities during gait in gym, with cues for upright BIG posture upon standing, with pt experiencing no posterior LOB.   Therapeutic Activites    Therapeutic Activities Other Therapeutic Activities   Other Therapeutic Activities LSVT BIG hierarchy task pushing open door, simulating narrow door at home:  practiced opening door with wide BOS, diagonal side stepping into door frame, with counting out loud with R leg leading, then sidestepping out of doorway diagonally with R leg leading.  Discussed again carrying only one item at a time when coming in and out of the door.  Discussed dog management for improved safety at home.            LSVT [facility]) - 09/29/15 0750    Floor to ceiling Other reps  8; 10 flicks on last rep   Side to side Other reps  8; 10 flicks at last rep   Step and Reach Forward Other reps (comment)  8 each side; chair for UE support   Step and Reach Backward Other  reps (comment)  8 reps to each side; UE support at chair   Step and Reach Sideways Other reps (comment)  8 reps each side; UE support at chair   Rock and Reach Forward/Backward Other reps (comment)  8 reps; 1 UE for support, progressed to trying bil UE swing   Rock and Reach Sideways Other reps (comment)  8 reps each side; no report of dizziness today   1 - Sit to stand 10 reps;Other reps (comment)  each 20", 18", 16" surface   Other Tasks 1 Big side step partial turn to sit varied height seats, 12 reps with frequent cues for BIG effort for weigthshift and step; multiple episodes of festination with activity   Other Tasks 2 Sidesteps at counter 5 reps 8 ft (each direction) with UE support,with cues for large amplitude stepping; cues to  slow pace and cues to reset if she feels herself hastening; pt begins to self correct with hastening at last 1-2 reps   Other Tasks 3 Stair negotiation 5 reps with 1 handrail, carrying item, with cues for large amplitude foot placement with step-to and step-through pattern   Task 1 Pushing open door-progressing to carrying in items, diagonal side step into doorframe simulating narrow doorway at home.  Multiple reps with cues for deliberate movement pattern            PT Education - 09/29/15 1300    Education provided Yes   Education Details task of pushing door open and walking through   [employer]) Educated Patient   Methods Explanation;Demonstration   Comprehension Verbalized understanding;Returned demonstration;Verbal cues required          PT Short Term Goals - 09/22/15 0945    PT SHORT TERM GOAL #1   Title Pt will verbalize understanding of fall prevention within the home environment.  TARGET 09/23/15   Time 2   Period Weeks   Status Achieved   PT SHORT TERM GOAL #2   Title Pt will verbalize/demonstrate understanding of tips to prevent freezing with gait and turns.   Time 4   Period Weeks   Status Partially Met   PT SHORT TERM GOAL #3   Title Pt will perform at least 8 of 10 reps of sit<>stand transfers from 18 inch surfaces or lower with no posterior lean or loss of balance, for improved safety/efficiency with transfers.   Time 4   Period Weeks   Status On-going           PT Long Term Goals - 09/07/15 1638    PT LONG TERM GOAL #1   Title Pt will be independent with HEP, including LSVT BIG exercises.  TARGET 11/21/15   Time 6   Period Weeks   Status New   PT LONG TERM GOAL #2   Title Pt will improve Functional Gait Assessment score to at least 18/30 for decreased risk of falls.   Time 6   Period Weeks   Status New   PT LONG TERM GOAL #3   Title Pt will improve 4-square step test score to less than or equal to 12 seconds with no LOB for decreased fall risk.    Time 6   Period Weeks   Status New   PT LONG TERM GOAL #4   Title Pt will report at least 25% reduction in falls over 4 week period for improved balance/decreased falling frequency.   Time 6   Period Weeks   Status New  Plan - 09/29/15 1301    Clinical Impression Statement Pt continues to have improved fluid movements with LSVT BIG daily exercises during therapy session.  Pt appears to have improved awareness of standing posture with no posterior loss of balance noted during session.  However, pt is reporting falls at home this week.  Pt conitnues to progress iwth LSVT BIG protocol.   Pt will benefit from skilled therapeutic intervention in order to improve on the following deficits Abnormal gait;Decreased balance;Decreased mobility;Decreased safety awareness;Decreased coordination;Decreased strength;Difficulty walking   Rehab Potential Good   PT Frequency 2x / week   PT Duration 2 weeks  then 4x/wk 4 wks LSVT BIG   PT Treatment/Interventions ADLs/Self Care Home Management;Gait training;Functional mobility training;Therapeutic activities;Therapeutic exercise;Neuromuscular re-education;Balance training;Patient/family education   PT Next Visit Plan LSVT BIG exercises, functional/carryover tasks- provided written copies of HEP-progress as patient is able.  GCODE next visit   Consulted and Agree with Plan of Care Patient        Problem List Patient Active Problem List   Diagnosis Date Noted  . UTI (lower urinary tract infection) 09/15/2014  . Acute kidney injury ([place]) 09/15/2014  . Acute encephalopathy 09/15/2014  . Orthostatic hypotension 09/15/2014  . Hypoglycemia 09/15/2014  . Frequent falls   . Renal insufficiency 09/14/2014  . Parkinson's disease (tremor, stiffness, slow motion, unstable posture) ([place]) 05/15/2013  . 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 Diastolic dysfunction 15/80/6386  . Hyperlipidemia 03/05/2012  .  Chest pain 03/04/2012  . Generalized anxiety disorder 03/04/2012  . HTN (hypertension) 03/04/2012  . Depression 03/04/2012    Sarath Privott W. 09/29/2015, 1:12 PM  Frazier Butt., PT  [facility] [address] [place], [place], [address] Phone: [phone]   Fax:  [phone]  Name: Wendy Reynolds MRN: [id number] Date of Birth: September 17, 1940

## 2015-09-30 ENCOUNTER — Ambulatory Visit: Payer: Commercial Managed Care - HMO | Admitting: Physical Therapy

## 2015-09-30 DIAGNOSIS — R2689 Other abnormalities of gait and mobility: Secondary | ICD-10-CM

## 2015-09-30 DIAGNOSIS — R269 Unspecified abnormalities of gait and mobility: Secondary | ICD-10-CM

## 2015-09-30 DIAGNOSIS — R2681 Unsteadiness on feet: Secondary | ICD-10-CM | POA: Diagnosis not present

## 2015-09-30 DIAGNOSIS — R293 Abnormal posture: Secondary | ICD-10-CM

## 2015-09-30 DIAGNOSIS — R29898 Other symptoms and signs involving the musculoskeletal system: Secondary | ICD-10-CM

## 2015-09-30 NOTE — Therapy (Signed)
[facility] [address] [place], [place], [address] Phone: [phone]   Fax:  [phone]  Physical Therapy Treatment  Patient Details  Name: Wendy Reynolds MRN: [id number] Date of Birth: September 27, 1940 Referring Provider: Sonia Baller  Encounter Date: 09/30/2015      PT End of Session - 09/30/15 1256    Visit Number 10   Number of Visits 21   Date for PT Re-Evaluation 11/07/15   Authorization Type [employer]- No Auth required   PT Start Time 0750   PT Stop Time 0847   PT Time Calculation (min) 57 min   Activity Tolerance Patient tolerated treatment well   Behavior During Therapy [facility] for tasks assessed/performed      Past Medical History  Diagnosis Date  . Panic   . Hypertension   . Depression   . Generalized anxiety disorder 03/04/2012  . Alcohol use ([place])   . Diastolic dysfunction 09/03/537    Grade 1. EF 60%  . Chest pain 03/04/2012    MI ruled out. Likely anxiety related.  . Hyperlipidemia 03/05/2012  . Parkinson disease ([place])   . Hypothyroid   . Parkinson's disease [facility])     Past Surgical History  Procedure Laterality Date  . Dilation and curettage of uterus      There were no vitals filed for this visit.  Visit Diagnosis:  Postural instability  Muscle rigidity  Festinating gait  Abnormality of gait      Subjective Assessment - 09/30/15 0752    Subjective Had a fall at home last night little sore in my R neck.  I am doing much better functionally at home, even with groceries yesterday.   Currently in Pain? Yes   Pain Score 3    Pain Location Neck   Pain Orientation Right   Pain Descriptors / Indicators Aching   Pain Type Acute pain   Pain Onset Yesterday   Pain Frequency Intermittent   Aggravating Factors  turning to the right side   Pain Relieving Factors took Advil                         [facility] Adult PT Treatment/Exercise - 09/30/15    Ambulation/Gait   Ambulation/Gait Yes  BIG walking at end of session   Ambulation/Gait Assistance 6: Modified independent (Device/Increase time)   Ambulation/Gait Assistance Details Initial cues for large amplitude arm swing, foot clearance and step length with gait.   Ambulation Distance (Feet) 600 Feet     Assistive device None   Gait Pattern Step-through pattern  widened BOS and foot clearance, improved arm swing   Pre-Gait Activities NA   Therapeutic Activites    Therapeutic Activities Other Therapeutic Activities   Other Therapeutic Activities LSVT BIG hierarchy task pushing open door, simulating narrow door at home:  practiced opening door with wide BOS, forward stepping through doorway.  Discussed again carrying only one item at a time when coming in and out of the door.  Discussed dog management for improved safety at home.    LSVT BIG hierarchy task of pulling open door and negotiating doorway, with cues for widened stance, deliberate 2-3 steps in posterior direction for improved ability to safety open door.  Pt able to take large steps through doorway, simulating home, 3 reps.        LSVT [facility]) - 09/29/15 0750    Floor to ceiling Other reps  8; 10 flicks on last rep  Side to side Other reps  8; 10 flicks at last rep   Step and Reach Forward Other reps (comment)  8 each side; chair for UE support   Step and Reach Backward Other reps (comment)  8 reps to each side; UE support at chair   Step and Reach Sideways Other reps (comment)  8 reps each side; UE support at chair   Rock and Reach Forward/Backward Other reps (comment)  8 reps; 1 UE for support, progressed to trying bil UE swing   Rock and Reach Sideways Other reps (comment)  8 reps each side; no report of dizziness today   1 - Sit to stand 10 reps;Other reps (comment)  each 20", 18", 16" surface   Other Tasks 1 Big side step partial turn to sit varied height seats, 10 reps with frequent cues for BIG effort for weigthshift and step;  multiple episodes of festination with activity   Other Tasks 2 Sidesteps at counter 5 reps 8 ft (each direction) with UE support,with cues for large amplitude stepping; cues to slow pace and increase step length and reset posture between change of directions.   Other Tasks 3 Stair negotiation 7 reps with 1 handrail, carrying item, with cues for large amplitude foot placement with step-to and step-through pattern.  Cues provided for large amplitude movement pattern for full foot placement and momentum for safe stair negotiation   Task 1 Pushing open door wide enough for forward walking pattern.  Multiple reps with cues for deliberate movement pattern            PT Education - 09/30/15 1255    Education provided Yes   Education Details HEP additions-see instructions   Person(s) Educated Patient   Methods Explanation;Demonstration;Handout   Comprehension Verbalized understanding;Returned demonstration;Need further instruction;Verbal cues required          PT Short Term Goals - 09/22/15 0945    PT SHORT TERM GOAL #1   Title Pt will verbalize understanding of fall prevention within the home environment.  TARGET 09/23/15   Time 2   Period Weeks   Status Achieved   PT SHORT TERM GOAL #2   Title Pt will verbalize/demonstrate understanding of tips to prevent freezing with gait and turns.   Time 4   Period Weeks   Status Partially Met   PT SHORT TERM GOAL #3   Title Pt will perform at least 8 of 10 reps of sit<>stand transfers from 18 inch surfaces or lower with no posterior lean or loss of balance, for improved safety/efficiency with transfers.   Time 4   Period Weeks   Status On-going           PT Long Term Goals - 09/07/15 1638    PT LONG TERM GOAL #1   Title Pt will be independent with HEP, including LSVT BIG exercises.  TARGET 11/21/15   Time 6   Period Weeks   Status New   PT LONG TERM GOAL #2   Title Pt will improve Functional Gait Assessment score to at least 18/30 for  decreased risk of falls.   Time 6   Period Weeks   Status New   PT LONG TERM GOAL #3   Title Pt will improve 4-square step test score to less than or equal to 12 seconds with no LOB for decreased fall risk.   Time 6   Period Weeks   Status New   PT LONG TERM GOAL #4   Title Pt will report  at least 25% reduction in falls over 4 week period for improved balance/decreased falling frequency.   Time 6   Period Weeks   Status New               Plan - October 04, 2015 1256    Clinical Impression Statement Needs continued reinforcement of maximal daily exercise practice in conjunction with pictures of handouts.  Pt does require UE support for standing exercises.  Overall, pt does report improved functional mobility with household tasks and reports family/other friends are noticing improvement in movement.  Pt will continue to benefit from further skilled PT to address balance, gait , posture and funcitonal mobility.   Pt will benefit from skilled therapeutic intervention in order to improve on the following deficits Abnormal gait;Decreased balance;Decreased mobility;Decreased safety awareness;Decreased coordination;Decreased strength;Difficulty walking   Rehab Potential Good   PT Frequency 2x / week   PT Duration 2 weeks  then 4x/wk 4 wks LSVT BIG   PT Treatment/Interventions ADLs/Self Care Home Management;Gait training;Functional mobility training;Therapeutic activities;Therapeutic exercise;Neuromuscular re-education;Balance training;Patient/family education   PT Next Visit Plan LSVT BIG exercises, functional/carryover tasks- provided written copies of HEP-progress as patient is able.     Consulted and Agree with Plan of Care Patient          G-Codes - 2015/10/04 1259    Functional Assessment Tool Used Reported functional improvement with household tasks; 1-2 falls per day (no falls from 2/23-2/28/17 sessions)   Functional Limitation Mobility: Walking and moving around   Mobility: Walking  and Moving Around Current Status [phone]) At least 40 percent but less than 60 percent impaired, limited or restricted   Mobility: Walking and Moving Around Goal Status [phone]) At least 20 percent but less than 40 percent impaired, limited or restricted      Problem List Patient Active Problem List   Diagnosis Date Noted  . UTI (lower urinary tract infection) 09/15/2014  . Acute kidney injury ([place]) 09/15/2014  . Acute encephalopathy 09/15/2014  . Orthostatic hypotension 09/15/2014  . Hypoglycemia 09/15/2014  . Frequent falls   . Renal insufficiency 09/14/2014  . Parkinson's disease (tremor, stiffness, slow motion, unstable posture) ([place]) 05/15/2013  . 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 Diastolic dysfunction 08/09/347  . Hyperlipidemia 03/05/2012  . Chest pain 03/04/2012  . Generalized anxiety disorder 03/04/2012  . HTN (hypertension) 03/04/2012  . Depression 03/04/2012    Shaindy Reader W. 04-Oct-2015, 1:01 PM  Frazier Butt., PT  [place] [place] [address] [place], [place], [address] Phone: [phone]   Fax:  [phone]  Name: KLARA STJAMES MRN: [id number] Date of Birth: February 21, 1941

## 2015-09-30 NOTE — Patient Instructions (Signed)
Provided LSVT BIG based handouts for step and reach exercises and for rock and reach exercises:  -Step and reach forward  -Step and reach side  -Step and reach back (all these three with UE support of counter)   -Forward/back rock and reach (hold to counter for support)  -Rock and twist and reach (hold to counter for support)  8 reps of each, 1-2 times per day (once on therapy days, twice on non-therapy days)

## 2015-10-04 ENCOUNTER — Ambulatory Visit: Payer: Commercial Managed Care - HMO | Admitting: Physical Therapy

## 2015-10-04 DIAGNOSIS — R2689 Other abnormalities of gait and mobility: Secondary | ICD-10-CM

## 2015-10-04 DIAGNOSIS — R269 Unspecified abnormalities of gait and mobility: Secondary | ICD-10-CM

## 2015-10-04 DIAGNOSIS — R293 Abnormal posture: Secondary | ICD-10-CM | POA: Diagnosis not present

## 2015-10-04 DIAGNOSIS — R29898 Other symptoms and signs involving the musculoskeletal system: Secondary | ICD-10-CM

## 2015-10-04 DIAGNOSIS — R2681 Unsteadiness on feet: Secondary | ICD-10-CM | POA: Diagnosis not present

## 2015-10-04 NOTE — Therapy (Signed)
[facility] [address] [place], [place], [address] Phone: [phone]   Fax:  [phone]  Physical Therapy Treatment  Patient Details  Name: Wendy Reynolds MRN: [id number] Date of Birth: 07-Oct-1940 Referring Provider: Sonia Baller  Encounter Date: 10/04/2015      PT End of Session - 10/04/15 1112    Visit Number 11   Number of Visits 21   Date for PT Re-Evaluation 11/07/15   Authorization Type [employer]- No Auth required   PT Start Time 0750   PT Stop Time 0848   PT Time Calculation (min) 58 min   Activity Tolerance Patient tolerated treatment well   Behavior During Therapy [facility] for tasks assessed/performed      Past Medical History  Diagnosis Date  . Panic   . Hypertension   . Depression   . Generalized anxiety disorder 03/04/2012  . Alcohol use ([place])   . Diastolic dysfunction 6/0/1093    Grade 1. EF 60%  . Chest pain 03/04/2012    MI ruled out. Likely anxiety related.  . Hyperlipidemia 03/05/2012  . Parkinson disease ([place])   . Hypothyroid   . Parkinson's disease [facility])     Past Surgical History  Procedure Laterality Date  . Dilation and curettage of uterus      There were no vitals filed for this visit.  Visit Diagnosis:  Postural instability  Muscle rigidity  Festinating gait  Abnormality of gait      Subjective Assessment - 10/04/15 0753    Subjective Friday and Saturday were good; Sunday was not so good-had 4 falls, but then I figured out that I should stop and reset and weightshift, then I did better.   Patient Stated Goals Pt's goal for therapy is to have less falls and to not freeze up so much with walking.   Currently in Pain? No/denies                         [facility] Adult PT Treatment/Exercise - 10/04/15 1115    Ambulation/Gait   Ambulation/Gait Yes  BIG walking at end of session   Ambulation/Gait Assistance 6: Modified independent (Device/Increase time)   Ambulation/Gait Assistance Details Intermittent cues for arm swing, step length, best BIG walking pattern   Ambulation Distance (Feet) 600 Feet   Assistive device None   Gait Pattern Step-through pattern  widened BOS and foot clearance, improved arm swing            LSVT [facility]) - 10/04/15 0756    Floor to ceiling 10 reps  10 flicks last rep   Side to side 10 reps  10 flicks last rep   Step and Reach Forward 10 reps  1 UE support at chair, cues for best posture with standing   Step and Reach Backward 10 reps  bilat UE support, cues to rock back big   Step and Reach Sideways 10 reps  1 UE support at chair, cues to add arm swing   Rock and Reach Forward/Backward 10 reps  1 UE support, cues for reaching with 1 UE   Rock and Reach Sideways 10 reps  Cues to look at hand (not behind) to avoid dizziness   1 - Sit to stand 10 reps  each 20", 18", 16" surfaces   Other Tasks 1 BIG side steps to turn to sit at varied height surfaces, 10 reps with cues for BIG deliberate steps to turn   Other  Tasks 2 Sidesteps at counter 5 reps x 8 ft, each directions, cues to reset between change of direction.  Cues for large amplitude stepping to reduce number of steps in each sideways direction.   Other Tasks 3 Stair negotiation 5 reps with bilateral handrails, step through pattern with cues for large amplitude/deliberate foot placement, foot clearance from steps.   Other Tasks 4 "Peel out" step, upon static standing for initiating change of direction-BIG diagonal step to initiate change of directions-practiced at least 10 reps at variable places in gym area.   Task 1 Pushing open door with BIG effort, then forward walk through doorway, at least 5 reps, with cues for wide BOS to push door open   Task 2 Pulling door open, multiple reps, with cues for widened BOS and BIG deliberate steps in posterior direction then weightshift to walk forward through doorway, with supervision.   Task 3 Simulated task in horse  stall for grooming her horse, needing to change sides of horse.  Utilized/practice BIG "peel off" step to the side with BIG walking to change side of horse.  Pt performs 3 reps each direction with no LOB.            PT Education - 10/04/15 1111    Education provided Yes   Education Details Review of HEP; use of BIG diagonal step for improved transitional stepping/change of direction from static standing position.   Person(s) Educated Patient   Methods Explanation;Demonstration   Comprehension Verbalized understanding;Returned demonstration;Verbal cues required;Need further instruction          PT Short Term Goals - 09/22/15 0945    PT SHORT TERM GOAL #1   Title Pt will verbalize understanding of fall prevention within the home environment.  TARGET 09/23/15   Time 2   Period Weeks   Status Achieved   PT SHORT TERM GOAL #2   Title Pt will verbalize/demonstrate understanding of tips to prevent freezing with gait and turns.   Time 4   Period Weeks   Status Partially Met   PT SHORT TERM GOAL #3   Title Pt will perform at least 8 of 10 reps of sit<>stand transfers from 18 inch surfaces or lower with no posterior lean or loss of balance, for improved safety/efficiency with transfers.   Time 4   Period Weeks   Status On-going           PT Long Term Goals - 09/07/15 1638    PT LONG TERM GOAL #1   Title Pt will be independent with HEP, including LSVT BIG exercises.  TARGET 11/21/15   Time 6   Period Weeks   Status New   PT LONG TERM GOAL #2   Title Pt will improve Functional Gait Assessment score to at least 18/30 for decreased risk of falls.   Time 6   Period Weeks   Status New   PT LONG TERM GOAL #3   Title Pt will improve 4-square step test score to less than or equal to 12 seconds with no LOB for decreased fall risk.   Time 6   Period Weeks   Status New   PT LONG TERM GOAL #4   Title Pt will report at least 25% reduction in falls over 4 week period for improved  balance/decreased falling frequency.   Time 6   Period Weeks   Status New               Plan - 10/04/15 1112  Clinical Impression Statement Pt reports doing exercises consistently over the weekend.  Have consistently added functional tasks, including doorway negotiation and stairs, simulating horse grooming tasks in horse stall.  Pt requires verbal cues initially for BIG step/large amplitude movements during functional activities, as pt continues to hurry and hasten, with quick/small/festinating steps at times.  Pt will continue to benefti from further skilled PT to address balance, gait, posture and functional mobility.   Pt will benefit from skilled therapeutic intervention in order to improve on the following deficits Abnormal gait;Decreased balance;Decreased mobility;Decreased safety awareness;Decreased coordination;Decreased strength;Difficulty walking   Rehab Potential Good   PT Frequency 2x / week   PT Duration 2 weeks  then 4x/wk 4 wks LSVT BIG   PT Treatment/Interventions ADLs/Self Care Home Management;Gait training;Functional mobility training;Therapeutic activities;Therapeutic exercise;Neuromuscular re-education;Balance training;Patient/family education   PT Next Visit Plan LSVT BIG exercises, functional/carryover tasks- provided written copies of HEP-progress as patient is able; review horse grooming simulation task   Consulted and Agree with Plan of Care Patient        Problem List Patient Active Problem List   Diagnosis Date Noted  . UTI (lower urinary tract infection) 09/15/2014  . Acute kidney injury ([place]) 09/15/2014  . Acute encephalopathy 09/15/2014  . Orthostatic hypotension 09/15/2014  . Hypoglycemia 09/15/2014  . Frequent falls   . Renal insufficiency 09/14/2014  . Parkinson's disease (tremor, stiffness, slow motion, unstable posture) ([place]) 05/15/2013  . 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 Diastolic dysfunction  83/77/9396  . Hyperlipidemia 03/05/2012  . Chest pain 03/04/2012  . Generalized anxiety disorder 03/04/2012  . HTN (hypertension) 03/04/2012  . Depression 03/04/2012    Taleshia Luff W. 10/04/2015, 11:18 AM Frazier Butt., PT  [facility] [address] [place], [place], [address] Phone: [phone]   Fax:  [phone]  Name: KYNA BLAHNIK MRN: [id number] Date of Birth: 1941-06-29

## 2015-10-05 ENCOUNTER — Ambulatory Visit: Payer: Commercial Managed Care - HMO | Admitting: Physical Therapy

## 2015-10-05 DIAGNOSIS — R269 Unspecified abnormalities of gait and mobility: Secondary | ICD-10-CM

## 2015-10-05 DIAGNOSIS — R29898 Other symptoms and signs involving the musculoskeletal system: Secondary | ICD-10-CM

## 2015-10-05 DIAGNOSIS — R2689 Other abnormalities of gait and mobility: Secondary | ICD-10-CM | POA: Diagnosis not present

## 2015-10-05 DIAGNOSIS — R293 Abnormal posture: Secondary | ICD-10-CM | POA: Diagnosis not present

## 2015-10-05 DIAGNOSIS — R2681 Unsteadiness on feet: Secondary | ICD-10-CM | POA: Diagnosis not present

## 2015-10-05 NOTE — Therapy (Signed)
[facility] [address] [place], [place], [address] Phone: [phone]   Fax:  [phone]  Physical Therapy Treatment  Patient Details  Name: Wendy Reynolds MRN: [id number] Date of Birth: 03-30-1941 Referring Provider: Sonia Baller  Encounter Date: 10/05/2015      PT End of Session - 10/05/15 0849    Visit Number 12   Number of Visits 21   Date for PT Re-Evaluation 11/07/15   Authorization Type [employer]- No Auth required   PT Start Time 0750   PT Stop Time 0846   PT Time Calculation (min) 56 min   Activity Tolerance Patient tolerated treatment well   Behavior During Therapy [facility] for tasks assessed/performed      Past Medical History  Diagnosis Date  . Panic   . Hypertension   . Depression   . Generalized anxiety disorder 03/04/2012  . Alcohol use ([place])   . Diastolic dysfunction 12/29/6071    Grade 1. EF 60%  . Chest pain 03/04/2012    MI ruled out. Likely anxiety related.  . Hyperlipidemia 03/05/2012  . Parkinson disease ([place])   . Hypothyroid   . Parkinson's disease [facility])     Past Surgical History  Procedure Laterality Date  . Dilation and curettage of uterus      There were no vitals filed for this visit.  Visit Diagnosis:  Postural instability  Muscle rigidity  Festinating gait  Abnormality of gait      Subjective Assessment - 10/05/15 0752    Subjective No falls or stumbles yesterday-this morning is not a good morning though-I was in a hurry and fell tripping over a yard ornament.  Not hurt   Patient Stated Goals Pt's goal for therapy is to have less falls and to not freeze up so much with walking.   Currently in Pain? No/denies                         [facility] Adult PT Treatment/Exercise - 10/05/15 0001    Ambulation/Gait   Ambulation/Gait Yes  BIG walking   Ambulation/Gait Assistance 6: Modified independent (Device/Increase time)   Ambulation/Gait Assistance  Details Intermittent cues for best BIG walking pattern   Ambulation Distance (Feet) 700 Feet   Assistive device None   Gait Pattern Step-through pattern  widened BOS, improved sarm swing and step length            LSVT [facility]) - 10/04/15 0756    Floor to ceiling 10 reps  10 flicks last rep   Side to side 10 reps  10 flicks last rep; cues for BIG flick   Step and Reach Forward 10 reps (2 sets) 1 UE support at chair/1 UE reach, cues for best posture with standing   Step and Reach Backward 10 reps (2 sets) bilat UE support, cues to rock back big; 2nd set with added coordinated arm swing   Step and Reach Sideways 10 reps  1 UE support at chair/1 UE reach, cues for best posture with standing   Rock and Reach Forward/Backward 10 reps (2 sets) 1 UE support, cues for reaching with 1 UE, progressing to bilateral arm swing with rocking/reaching   Rock and Reach Sideways 10 reps (2 sets) Cues to look at hand (not behind) to avoid dizziness   1 - Sit to stand 10 reps  each 20", 18", 16" surfaces   Other Tasks 1 BIG side steps to turn to sit  at varied height surfaces, 10 reps with cues for BIG deliberate steps to turn   Other Tasks 2 Sidesteps at counter 5 reps x 8 ft, each directions, cues to reset between change of direction.  Cues for large amplitude stepping to reduce number of steps in each sideways direction.   Other Tasks 3 Stair negotiation 5 reps with bilateral handrails, step through pattern with cues for large amplitude/deliberate foot placement, foot clearance from steps.   Other Tasks 4 "Peel out" step, upon static standing for initiating change of direction-BIG diagonal step to initiate change of directions-practiced at least 8 reps at variable places in gym area.   Task 1 Pushing open door with BIG effort, then forward walk through doorway, 4 reps, with cues for wide BOS to push door open; progression of adding weighted shopping bags to task   Task 2 Pulling door open, multiple  reps, with cues for widened BOS and BIG deliberate steps in posterior direction then weightshift to walk forward through doorway, with supervision.   Task 3 Simulated task in horse stall for grooming her horse, needing to change sides of horse.  Utilized/practice BIG "peel off" step to the side with BIG walking to change side of horse.  Pt performs 3 reps each direction with no LOB.              PT Short Term Goals - 09/22/15 0945    PT SHORT TERM GOAL #1   Title Pt will verbalize understanding of fall prevention within the home environment.  TARGET 09/23/15   Time 2   Period Weeks   Status Achieved   PT SHORT TERM GOAL #2   Title Pt will verbalize/demonstrate understanding of tips to prevent freezing with gait and turns.   Time 4   Period Weeks   Status Partially Met   PT SHORT TERM GOAL #3   Title Pt will perform at least 8 of 10 reps of sit<>stand transfers from 18 inch surfaces or lower with no posterior lean or loss of balance, for improved safety/efficiency with transfers.   Time 4   Period Weeks   Status On-going           PT Long Term Goals - 09/07/15 1638    PT LONG TERM GOAL #1   Title Pt will be independent with HEP, including LSVT BIG exercises.  TARGET 11/21/15   Time 6   Period Weeks   Status New   PT LONG TERM GOAL #2   Title Pt will improve Functional Gait Assessment score to at least 18/30 for decreased risk of falls.   Time 6   Period Weeks   Status New   PT LONG TERM GOAL #3   Title Pt will improve 4-square step test score to less than or equal to 12 seconds with no LOB for decreased fall risk.   Time 6   Period Weeks   Status New   PT LONG TERM GOAL #4   Title Pt will report at least 25% reduction in falls over 4 week period for improved balance/decreased falling frequency.   Time 6   Period Weeks   Status New               Plan - 10/05/15 0850    Clinical Impression Statement Skilled PT session focused today on progression of reps  and with coordination of UE movements during LSVT BIG exercises.  Continued functional and hierarchy tasks involving transfers, stair negotiation, gait activities, doorway negotiation  and simulation of horse stall grooming activity.  Pt continues to need occasional verbal cues for resetting posture, but pt is noting improvements with functional movements.  In therapy sessions, pt is able to progress with additions of UE to HEP and is able to self correct posture in standing, demonstrating improved awareness.  Pt will continue to benefit from further skilled PT to address blaance, gait, posture, and functional mobility.   Pt will benefit from skilled therapeutic intervention in order to improve on the following deficits Abnormal gait;Decreased balance;Decreased mobility;Decreased safety awareness;Decreased coordination;Decreased strength;Difficulty walking   Rehab Potential Good   PT Frequency 2x / week   PT Duration 2 weeks  then 4x/wk 4 wks LSVT BIG   PT Treatment/Interventions ADLs/Self Care Home Management;Gait training;Functional mobility training;Therapeutic activities;Therapeutic exercise;Neuromuscular re-education;Balance training;Patient/family education   PT Next Visit Plan LSVT BIG exercises, functional/carryover tasks- -progress as patient is able; review horse grooming simulation task   Consulted and Agree with Plan of Care Patient        Problem List Patient Active Problem List   Diagnosis Date Noted  . UTI (lower urinary tract infection) 09/15/2014  . Acute kidney injury ([place]) 09/15/2014  . Acute encephalopathy 09/15/2014  . Orthostatic hypotension 09/15/2014  . Hypoglycemia 09/15/2014  . Frequent falls   . Renal insufficiency 09/14/2014  . Parkinson's disease (tremor, stiffness, slow motion, unstable posture) ([place]) 05/15/2013  . 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 Diastolic dysfunction 27/63/9432  . Hyperlipidemia 03/05/2012  . Chest  pain 03/04/2012  . Generalized anxiety disorder 03/04/2012  . HTN (hypertension) 03/04/2012  . Depression 03/04/2012    MARRIOTT,AMY W. 10/05/2015, 8:55 AM Frazier Butt., PT [facility] [address] [place], [place], [address] Phone: [phone]   Fax:  [phone]  Name: Wendy Reynolds MRN: [id number] Date of Birth: 06-22-1941

## 2015-10-06 ENCOUNTER — Ambulatory Visit: Payer: Commercial Managed Care - HMO | Admitting: Physical Therapy

## 2015-10-06 DIAGNOSIS — R29898 Other symptoms and signs involving the musculoskeletal system: Secondary | ICD-10-CM

## 2015-10-06 DIAGNOSIS — R2689 Other abnormalities of gait and mobility: Secondary | ICD-10-CM | POA: Diagnosis not present

## 2015-10-06 DIAGNOSIS — R2681 Unsteadiness on feet: Secondary | ICD-10-CM | POA: Diagnosis not present

## 2015-10-06 DIAGNOSIS — R269 Unspecified abnormalities of gait and mobility: Secondary | ICD-10-CM

## 2015-10-06 DIAGNOSIS — R293 Abnormal posture: Secondary | ICD-10-CM | POA: Diagnosis not present

## 2015-10-07 ENCOUNTER — Ambulatory Visit: Payer: Commercial Managed Care - HMO | Admitting: Physical Therapy

## 2015-10-07 NOTE — Therapy (Signed)
[facility] [address] [place], [place], [address] Phone: [phone]   Fax:  [phone]  Physical Therapy Treatment  Patient Details  Name: Wendy Reynolds MRN: [id number] Date of Birth: [birth year] Referring Provider: Sonia Baller  Encounter Date: 10/06/2015      PT End of Session - 10/07/15 1222    Visit Number 13   Number of Visits 21   Date for PT Re-Evaluation 11/07/15   Authorization Type [employer]- No Auth required   PT Start Time 1017   PT Stop Time 1116   PT Time Calculation (min) 59 min   Equipment Utilized During Treatment Gait belt   Activity Tolerance Patient tolerated treatment well   Behavior During Therapy [facility] for tasks assessed/performed      Past Medical History  Diagnosis Date  . Panic   . Hypertension   . Depression   . Generalized anxiety disorder 03/04/2012  . Alcohol use ([place])   . Diastolic dysfunction 4/0/1027    Grade 1. EF 60%  . Chest pain 03/04/2012    MI ruled out. Likely anxiety related.  . Hyperlipidemia 03/05/2012  . Parkinson disease ([place])   . Hypothyroid   . Parkinson's disease [facility])     Past Surgical History  Procedure Laterality Date  . Dilation and curettage of uterus      There were no vitals filed for this visit.  Visit Diagnosis:  Postural instability  Muscle rigidity  Festinating gait  Abnormality of gait      Subjective Assessment - 10/06/15 1020    Subjective Did wonderfully yesterday afternoon-daughter really commented how well I was moving (didn't freeze up or fall once); harder time this morning with dogs, but I didn't fall any this morning.   Patient Stated Goals Pt's goal for therapy is to have less falls and to not freeze up so much with walking.   Currently in Pain? No/denies                         [facility] Adult PT Treatment/Exercise - 10/07/15 0001    Ambulation/Gait   Ambulation/Gait Yes  BIG walking   Ambulation/Gait Assistance 5: Supervision;4: Min guard   Ambulation/Gait Assistance Details Cues for outdoor gait:  including taking BIG amplitude steps for improved foot clearance, not necessarily long steps, in order to balance better on grassy surfaces.  Also discussed visual targets at ground approx 10 ft ahead to assist with motor planning in case of obstacles or unlevel ground.   Ambulation Distance (Feet) 500 Feet  then 150 ft on grassy surfaces   Assistive device None   Gait Pattern Step-through pattern  some posterior lean initially on outdoor surfaces   Ambulation Surface Level;Indoor;Unlevel;Grass;Outdoor;Paved            LSVT [facility]) - 10/06/15 1024    Floor to ceiling 10 reps  10 flicks last rep   Side to side 10 reps  10 flicks last reps   Step and Reach Forward 10 reps  1 UE support>no UE support with coordinated arms   Step and Reach Backward 10 reps  bilat UE support>coordinated arms 2nd set   Step and Reach Sideways 10 reps  1 UE support, cues for head turn/look at hands   Rock and Reach Forward/Backward 10 reps  1UE support>coordinated bilat arm swing 2nd set   Rock and Reach Sideways 10 reps  cues to look at hands, BIG stop in middlefor  activation   1 - Sit to stand 10 reps  each from 20", 18", 16" surfaces   Other Tasks 1 BIG side steps to turn to sit at varied height surfaces 15-20 reps (cues needed for optimal Big steps and weightshift)_  Pt needs incr. cues today for pacing   Other Tasks 2 Sidesteps at counter, 5 reps each direction, with cues for large amplitude stepping each direction.   Other Tasks 3 Stair negotiation 6 reps, with 1 handrail, step through pattern, with cues for foot placement, foot clearance and momentum to propel up and down steps to avoid posterior lean.   Other Tasks 4 Large "peel out" diagonal step to initiate change of direction from static standing position, at least 8 reps at varied areas in gym.   Task 1 Pushing open (heavier)  door to outside, x 2 reps wtih cues for large amplitude push and step length through doorway.   Task 2 Pulling open (heavier) door from outside x 2 reps with cues for large amplitude backward step and weightshift to change directions to start forward walking through doorway.  No LOB noted.   Task 3 Simulates horse grooming task in horse stall, x 3 reps, with cues for BIG widened BOS during standing grooming activitiy for improved weightshifting, with large diagonal initial step, no LOB noted.     Pt performs 2 sets of standing step and reach activities, 2 sets of standing rock and reach activities, with coordination of bilateral UE movements second set.       PT Education - 10/07/15 1221    Education provided Yes   Education Details Updates to daily maximal exercises-progression to 1UE support/1 coordinated arm (bilateral coordinated arms at times), outdoor gait   Person(s) Educated Patient   Methods Explanation;Demonstration;Handout   Comprehension Verbalized understanding;Returned demonstration;Verbal cues required;Need further instruction          PT Short Term Goals - 09/22/15 0945    PT SHORT TERM GOAL #1   Title Pt will verbalize understanding of fall prevention within the home environment.  TARGET 09/23/15   Time 2   Period Weeks   Status Achieved   PT SHORT TERM GOAL #2   Title Pt will verbalize/demonstrate understanding of tips to prevent freezing with gait and turns.   Time 4   Period Weeks   Status Partially Met   PT SHORT TERM GOAL #3   Title Pt will perform at least 8 of 10 reps of sit<>stand transfers from 18 inch surfaces or lower with no posterior lean or loss of balance, for improved safety/efficiency with transfers.   Time 4   Period Weeks   Status On-going           PT Long Term Goals - 09/07/15 1638    PT LONG TERM GOAL #1   Title Pt will be independent with HEP, including LSVT BIG exercises.  TARGET 11/21/15   Time 6   Period Weeks   Status New   PT  LONG TERM GOAL #2   Title Pt will improve Functional Gait Assessment score to at least 18/30 for decreased risk of falls.   Time 6   Period Weeks   Status New   PT LONG TERM GOAL #3   Title Pt will improve 4-square step test score to less than or equal to 12 seconds with no LOB for decreased fall risk.   Time 6   Period Weeks   Status New   PT LONG TERM GOAL #4  Title Pt will report at least 25% reduction in falls over 4 week period for improved balance/decreased falling frequency.   Time 6   Period Weeks   Status New               Plan - 10/07/15 1222    Clinical Impression Statement Pt is improving with maximal daily exercises, in ability to coordinate one>bilateral UE movements with stepping and with rocking/reaching exercises.  Pt needs cues today for safe negotiation of grassy, outdoor surfaces today, and demonstrates some posterior lean at times with grassy surface negotiation.  Pt will continue to benefit from further skilled PT to address balance, gait, posture and functional mobility.   Pt will benefit from skilled therapeutic intervention in order to improve on the following deficits Abnormal gait;Decreased balance;Decreased mobility;Decreased safety awareness;Decreased coordination;Decreased strength;Difficulty walking   Rehab Potential Good   PT Frequency 2x / week   PT Duration 2 weeks  then 4x/wk 4 wks LSVT BIG   PT Treatment/Interventions ADLs/Self Care Home Management;Gait training;Functional mobility training;Therapeutic activities;Therapeutic exercise;Neuromuscular re-education;Balance training;Patient/family education   PT Next Visit Plan LSVT BIG exercises, functional/carryover tasks- -progress as patient is able; outdoor gait activity   Consulted and Agree with Plan of Care Patient        Problem List Patient Active Problem List   Diagnosis Date Noted  . UTI (lower urinary tract infection) 09/15/2014  . Acute kidney injury ([place]) 09/15/2014  . Acute  encephalopathy 09/15/2014  . Orthostatic hypotension 09/15/2014  . Hypoglycemia 09/15/2014  . Frequent falls   . Renal insufficiency 09/14/2014  . Parkinson's disease (tremor, stiffness, slow motion, unstable posture) ([place]) 05/15/2013  . 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 Diastolic dysfunction 01/74/9449  . Hyperlipidemia 03/05/2012  . Chest pain 03/04/2012  . Generalized anxiety disorder 03/04/2012  . HTN (hypertension) 03/04/2012  . Depression 03/04/2012    Lizett Chowning W. 10/07/2015, 12:25 PM Frazier Butt., PT [facility] [address] [place], [place], [address] Phone: [phone]   Fax:  [phone]  Name: REMINGTON SKALSKY MRN: [id number] Date of Birth: 01/25/1941

## 2015-10-11 ENCOUNTER — Ambulatory Visit: Payer: Self-pay | Admitting: Physical Therapy

## 2015-10-11 DIAGNOSIS — R259 Unspecified abnormal involuntary movements: Secondary | ICD-10-CM | POA: Diagnosis not present

## 2015-10-11 DIAGNOSIS — Z7982 Long term (current) use of aspirin: Secondary | ICD-10-CM | POA: Diagnosis not present

## 2015-10-11 DIAGNOSIS — G20 Parkinson's disease: Secondary | ICD-10-CM | POA: Diagnosis not present

## 2015-10-11 DIAGNOSIS — Z880 Allergy status to penicillin: Secondary | ICD-10-CM | POA: Diagnosis not present

## 2015-10-11 DIAGNOSIS — I639 Cerebral infarction, unspecified: Secondary | ICD-10-CM | POA: Diagnosis not present

## 2015-10-11 DIAGNOSIS — Z79899 Other long term (current) drug therapy: Secondary | ICD-10-CM | POA: Diagnosis not present

## 2015-10-11 DIAGNOSIS — Z881 Allergy status to other antibiotic agents status: Secondary | ICD-10-CM | POA: Diagnosis not present

## 2015-10-12 ENCOUNTER — Ambulatory Visit: Payer: Commercial Managed Care - HMO | Admitting: Physical Therapy

## 2015-10-12 DIAGNOSIS — R2689 Other abnormalities of gait and mobility: Secondary | ICD-10-CM | POA: Diagnosis not present

## 2015-10-12 DIAGNOSIS — R29898 Other symptoms and signs involving the musculoskeletal system: Secondary | ICD-10-CM

## 2015-10-12 DIAGNOSIS — R2681 Unsteadiness on feet: Secondary | ICD-10-CM | POA: Diagnosis not present

## 2015-10-12 DIAGNOSIS — R293 Abnormal posture: Secondary | ICD-10-CM

## 2015-10-12 DIAGNOSIS — R269 Unspecified abnormalities of gait and mobility: Secondary | ICD-10-CM

## 2015-10-12 NOTE — Therapy (Signed)
[facility] [address] [place], [place], [address] Phone: [phone]   Fax:  [phone]  Physical Therapy Treatment  Patient Details  Name: Wendy Reynolds MRN: [id number] Date of Birth: 1941-05-19 Referring Provider: Sonia Baller  Encounter Date: 10/12/2015      PT End of Session - 10/12/15 0900    Visit Number 14   Number of Visits 21   Date for PT Re-Evaluation 11/07/15   Authorization Type [employer]- No Auth required   PT Start Time 5573   PT Stop Time 0844   PT Time Calculation (min) 57 min   Activity Tolerance Patient tolerated treatment well   Behavior During Therapy [facility] for tasks assessed/performed      Past Medical History  Diagnosis Date  . Panic   . Hypertension   . Depression   . Generalized anxiety disorder 03/04/2012  . Alcohol use ([place])   . Diastolic dysfunction 08/31/252    Grade 1. EF 60%  . Chest pain 03/04/2012    MI ruled out. Likely anxiety related.  . Hyperlipidemia 03/05/2012  . Parkinson disease ([place])   . Hypothyroid   . Parkinson's disease [facility])     Past Surgical History  Procedure Laterality Date  . Dilation and curettage of uterus      There were no vitals filed for this visit.  Visit Diagnosis:  Postural instability  Muscle rigidity  Festinating gait  Abnormality of gait      Subjective Assessment - 10/12/15 0750    Subjective Did very good at the horse show over the weekend.  Only had one fall over the weekened, that was in the hotel bathroom tripping over my daughter's crutch.  Had to cancel PT appt yesterday due to appt at [facility].  MD pleased with progress, they did MRI and I do not have PSP.   Patient Stated Goals Pt's goal for therapy is to have less falls and to not freeze up so much with walking.   Currently in Pain? No/denies                         [facility] Adult PT Treatment/Exercise - 10/12/15 0858    Ambulation/Gait    Ambulation/Gait Yes  BIG walking   Ambulation/Gait Assistance 5: Supervision   Ambulation/Gait Assistance Details Cues for BIG step pattern; gait with change of directions, narrow spaces, turns and changes of directions around furniture; no LOB, no festination noted with gait activities today.   Ambulation Distance (Feet) 700 Feet   Assistive device None   Gait Pattern Step-through pattern  widened BOS, improved arm swing, improved step length   Ambulation Surface Level;Unlevel            LSVT [facility]) - 10/12/15 0753    Floor to ceiling 10 reps  10 flicks last rep   Side to side 10 reps  10 flicks last rep   Step and Reach Forward 10 reps;Other reps (comment)  1UE support>no UE support/coordinated arms 2nd set   Step and Reach Backward 10 reps;Other reps (comment)  1 UE support>no UE support/coordinated arms 2nd set   Step and Reach Sideways 10 reps;Other reps (comment)  1UE support>no UE support/coordinated arms 2nd set   Rock and Reach Forward/Backward 10 reps  each position, bilateral coordinated arm swing   Rock and Reach Sideways 10 reps  bilateral coordinated arm swing with visual target to hands   1 - Sit to stand  10 reps  each from 20", 18", 16" surfaces   Other Tasks 1 BIG side steps to turn to sit at varied heights 10 reps   Other Tasks 2 Sidesteps at counter, 5 reps each direction, on solid surface and on compliant mat surface, with cues for widened, BIG deliberate step length each direction   Other Tasks 3 Stair negotiation 6 reps with 1 handrail, step through pattern, cues for BIG foot placement/BIG foot clearance ascending/descending stairs   Other Tasks 4 BIG "peel out" step, varied areas in gym at least 10 reps, to initiate change f directions at static stance.   Other task Forward/Back walking at counter, multiple reps on compliant surface and solid surface, with cues for BIG step length, slowed pace, improved weightshift for change of directions  Used metronome  for pacing in back direction 68-72 bpm              PT Short Term Goals - 09/22/15 0945    PT SHORT TERM GOAL #1   Title Pt will verbalize understanding of fall prevention within the home environment.  TARGET 09/23/15   Time 2   Period Weeks   Status Achieved   PT SHORT TERM GOAL #2   Title Pt will verbalize/demonstrate understanding of tips to prevent freezing with gait and turns.   Time 4   Period Weeks   Status Partially Met   PT SHORT TERM GOAL #3   Title Pt will perform at least 8 of 10 reps of sit<>stand transfers from 18 inch surfaces or lower with no posterior lean or loss of balance, for improved safety/efficiency with transfers.   Time 4   Period Weeks   Status On-going           PT Long Term Goals - 09/07/15 1638    PT LONG TERM GOAL #1   Title Pt will be independent with HEP, including LSVT BIG exercises.  TARGET 11/21/15   Time 6   Period Weeks   Status New   PT LONG TERM GOAL #2   Title Pt will improve Functional Gait Assessment score to at least 18/30 for decreased risk of falls.   Time 6   Period Weeks   Status New   PT LONG TERM GOAL #3   Title Pt will improve 4-square step test score to less than or equal to 12 seconds with no LOB for decreased fall risk.   Time 6   Period Weeks   Status New   PT LONG TERM GOAL #4   Title Pt will report at least 25% reduction in falls over 4 week period for improved balance/decreased falling frequency.   Time 6   Period Weeks   Status New               Plan - 10/12/15 0900    Clinical Impression Statement Pt continues to improve with coordination of LSVT BIG maximal daily exercises, with coordinated arm movements with standing.  However, pt does need increased cues for pacing, BIG deliberate movements once bilat arms are added into exercises.  Addressed forward/backward walking at counter today, with focus on weightshifting for improved transition between forward/back direction.  Pt will continue to  benefit from further skilled PT to address balance, gait, posture and functional mobility.   Pt will benefit from skilled therapeutic intervention in order to improve on the following deficits Abnormal gait;Decreased balance;Decreased mobility;Decreased safety awareness;Decreased coordination;Decreased strength;Difficulty walking   Rehab Potential Good   PT Frequency 2x /  week   PT Duration 2 weeks  then 4x/wk 4 wks LSVT BIG   PT Treatment/Interventions ADLs/Self Care Home Management;Gait training;Functional mobility training;Therapeutic activities;Therapeutic exercise;Neuromuscular re-education;Balance training;Patient/family education   PT Next Visit Plan LSVT BIG exercises, functional/carryover tasks- -progress as patient is able; outdoor gait activity; backwards/forwards walking at counter for HEP   Consulted and Agree with Plan of Care Patient     Plan:  Compliant surfaces    Problem List Patient Active Problem List   Diagnosis Date Noted  . UTI (lower urinary tract infection) 09/15/2014  . Acute kidney injury ([place]) 09/15/2014  . Acute encephalopathy 09/15/2014  . Orthostatic hypotension 09/15/2014  . Hypoglycemia 09/15/2014  . Frequent falls   . Renal insufficiency 09/14/2014  . Parkinson's disease (tremor, stiffness, slow motion, unstable posture) ([place]) 05/15/2013  . 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 Diastolic dysfunction 60/60/0459  . Hyperlipidemia 03/05/2012  . Chest pain 03/04/2012  . Generalized anxiety disorder 03/04/2012  . HTN (hypertension) 03/04/2012  . Depression 03/04/2012    MARRIOTT,AMY W. 10/12/2015, 9:03 AM  Frazier Butt., PT [facility] [address] [place], [place], [address] Phone: [phone]   Fax:  [phone]  Name: Wendy Reynolds MRN: [id number] Date of Birth: Jul 05, 1941

## 2015-10-13 ENCOUNTER — Ambulatory Visit: Payer: Commercial Managed Care - HMO | Admitting: Physical Therapy

## 2015-10-13 DIAGNOSIS — R29898 Other symptoms and signs involving the musculoskeletal system: Secondary | ICD-10-CM | POA: Diagnosis not present

## 2015-10-13 DIAGNOSIS — R2689 Other abnormalities of gait and mobility: Secondary | ICD-10-CM

## 2015-10-13 DIAGNOSIS — R2681 Unsteadiness on feet: Secondary | ICD-10-CM | POA: Diagnosis not present

## 2015-10-13 DIAGNOSIS — R269 Unspecified abnormalities of gait and mobility: Secondary | ICD-10-CM

## 2015-10-13 DIAGNOSIS — R293 Abnormal posture: Secondary | ICD-10-CM | POA: Diagnosis not present

## 2015-10-13 NOTE — Patient Instructions (Signed)
Forward/backward walking at counter -Take BIG, deliberate steps in backward direction -SLOW down your backward steps, "crouched position -COUNT out loud your backward steps  _TAKE A MOMENT TO RESET YOUR POSTURE BEFORE YOU CHANGE DIRECTIONS TO WALK FORWARD

## 2015-10-13 NOTE — Therapy (Signed)
[facility] [address] [place], [place], [address] Phone: [phone]   Fax:  [phone]  Physical Therapy Treatment  Patient Details  Name: Wendy Reynolds MRN: [id number] Date of Birth: [birth year] Referring Provider: Sonia Baller  Encounter Date: 10/13/2015      PT End of Session - 10/13/15 2242    Visit Number 15   Number of Visits 21   Date for PT Re-Evaluation 11/07/15   Authorization Type [employer]- No Auth required   PT Start Time 0748   PT Stop Time 0850   PT Time Calculation (min) 62 min   Activity Tolerance Patient tolerated treatment well   Behavior During Therapy [facility] for tasks assessed/performed      Past Medical History  Diagnosis Date  . Panic   . Hypertension   . Depression   . Generalized anxiety disorder 03/04/2012  . Alcohol use ([place])   . Diastolic dysfunction 12/29/6835    Grade 1. EF 60%  . Chest pain 03/04/2012    MI ruled out. Likely anxiety related.  . Hyperlipidemia 03/05/2012  . Parkinson disease ([place])   . Hypothyroid   . Parkinson's disease [facility])     Past Surgical History  Procedure Laterality Date  . Dilation and curettage of uterus      There were no vitals filed for this visit.  Visit Diagnosis:  Postural instability  Muscle rigidity  Festinating gait  Abnormality of gait      Subjective Assessment - 10/13/15 0749    Subjective Doing well this morning.  Had one fall yesterday, backing up from the dog's kennel-fell on ground, not hurt.   Currently in Pain? No/denies                         [facility] Adult PT Treatment/Exercise - 10/13/15 0001    Ambulation/Gait   Ambulation/Gait --  BIG walking   Ambulation/Gait Assistance 6: Modified independent (Device/Increase time)   Ambulation Distance (Feet) 700 Feet  including turns, maneuvering around furniture   Assistive device None   Gait Pattern Step-through pattern  widened BOS, improved arm  swing, step length   Ambulation Surface Level;Indoor      During maximal daily exercises, PT provides cues for maximal BIG effort      LSVT [facility]) - 10/12/15 0753    Floor to ceiling 10 reps  10 flicks last rep   Side to side 10 reps  10 flicks last rep   Step and Reach Forward 10 reps;Other reps (comment)  1UE support>no UE support/coordinated arms 2nd set   Step and Reach Backward 10 reps;Other reps (comment)  1 UE support>no UE support/coordinated arms 2nd set   Step and Reach Sideways 10 reps;Other reps (comment)  1UE support>no UE support/coordinated arms 2nd set   Rock and Reach Forward/Backward 10 reps  each position, bilateral coordinated arm swing   Rock and Reach Sideways 10 reps  bilateral coordinated arm swing with visual target to hands   1 - Sit to stand 12 reps  each from 20", 18", 16" surfaces   Other Tasks 1 BIG side steps to turn to sit at varied heights 15 reps   Other Tasks 2 Sidesteps at counter, 5 reps each direction, on solid surface and 5 reps on compliant mat surface, with cues for widened, BIG deliberate step length each direction   Other Tasks 3 Stair negotiation 10 reps with 1-2 handrails, step through pattern, cues  for BIG foot placement/BIG foot clearance ascending/descending stairs   Other Tasks 4 BIG "peel out" step, varied areas in gym at least 8 reps, to initiate change of directions at static stance.   Other task Forward/Back walking at counter, multiple reps on compliant surface and solid surface, with cues for BIG step length, slowed pace, improved weightshift for change of directions  Cues for large, deliberate step and counting out loud     Hierarchy Tasks:  Pushing open doorway to walk through then pulling door, with several steps posteriorly with transition/weightshift to walking in forward direction, at least 5 reps, no loss of balance.  Cues provided for maximal BIG effort with steps posteriorly and for maximal BIG posture with transition  to walking forward to avoid LOB.  Hierarchy Task of practicing pushing, turning, walking backwards with empty wheelchair, then wheelchair with PT tech sitting in it, with supervision, with cues for BIG wide BOS for stance, with cues for BIG weightshift for transition and BIG steps in backward direction.  Therapist provides supervision during these tasks for safety.       PT Education - 10/13/15 2241    Education provided Yes   Education Details Forward/backward walking at counter added to HEP; counting, deliberate stepping when backward walking   Person(s) Educated Patient   Methods Explanation;Demonstration;Handout   Comprehension Verbalized understanding;Returned demonstration;Verbal cues required          PT Short Term Goals - 09/22/15 0945    PT SHORT TERM GOAL #1   Title Pt will verbalize understanding of fall prevention within the home environment.  TARGET 09/23/15   Time 2   Period Weeks   Status Achieved   PT SHORT TERM GOAL #2   Title Pt will verbalize/demonstrate understanding of tips to prevent freezing with gait and turns.   Time 4   Period Weeks   Status Partially Met   PT SHORT TERM GOAL #3   Title Pt will perform at least 8 of 10 reps of sit<>stand transfers from 18 inch surfaces or lower with no posterior lean or loss of balance, for improved safety/efficiency with transfers.   Time 4   Period Weeks   Status On-going           PT Long Term Goals - 09/07/15 1638    PT LONG TERM GOAL #1   Title Pt will be independent with HEP, including LSVT BIG exercises.  TARGET 11/21/15   Time 6   Period Weeks   Status New   PT LONG TERM GOAL #2   Title Pt will improve Functional Gait Assessment score to at least 18/30 for decreased risk of falls.   Time 6   Period Weeks   Status New   PT LONG TERM GOAL #3   Title Pt will improve 4-square step test score to less than or equal to 12 seconds with no LOB for decreased fall risk.   Time 6   Period Weeks   Status New    PT LONG TERM GOAL #4   Title Pt will report at least 25% reduction in falls over 4 week period for improved balance/decreased falling frequency.   Time 6   Period Weeks   Status New               Plan - 10/13/15 2242    Clinical Impression Statement Pt continues to progress with improved coordination of UE movements during LSVT BIG maximal daily exercises.  Focus during session was on backwards  walking at counter with progression to doorways and pushing/pulling wheelchair to simulate assisting pt's daughter.  Pt responds well to cueing for improved large deliberate stepping in posterior direction with progression of activities.   Pt will benefit from skilled therapeutic intervention in order to improve on the following deficits Abnormal gait;Decreased balance;Decreased mobility;Decreased safety awareness;Decreased coordination;Decreased strength;Difficulty walking   Rehab Potential Good   PT Frequency 2x / week   PT Duration 2 weeks  then 4x/wk 4 wks LSVT BIG   PT Treatment/Interventions ADLs/Self Care Home Management;Gait training;Functional mobility training;Therapeutic activities;Therapeutic exercise;Neuromuscular re-education;Balance training;Patient/family education   PT Next Visit Plan LSVT BIG exercises, functional/carryover tasks- -progress as patient is able; outdoor gait activity; backwards/forwards walking at counter for HEP; discuss with pt PWR! Moves ex class   Consulted and Agree with Plan of Care Patient        Problem List Patient Active Problem List   Diagnosis Date Noted  . UTI (lower urinary tract infection) 09/15/2014  . Acute kidney injury ([place]) 09/15/2014  . Acute encephalopathy 09/15/2014  . Orthostatic hypotension 09/15/2014  . Hypoglycemia 09/15/2014  . Frequent falls   . Renal insufficiency 09/14/2014  . Parkinson's disease (tremor, stiffness, slow motion, unstable posture) ([place]) 05/15/2013  . 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 Diastolic dysfunction 62/86/3817  . Hyperlipidemia 03/05/2012  . Chest pain 03/04/2012  . Generalized anxiety disorder 03/04/2012  . HTN (hypertension) 03/04/2012  . Depression 03/04/2012    Eavan Gonterman W. 10/13/2015, 10:50 PM  Frazier Butt., PT  [facility] [address] [place], [place], [address] Phone: [phone]   Fax:  [phone]  Name: LAURISSA COWPER MRN: [id number] Date of Birth: 07-25-1941

## 2015-10-14 ENCOUNTER — Ambulatory Visit: Payer: Commercial Managed Care - HMO | Admitting: Physical Therapy

## 2015-10-17 DIAGNOSIS — I6529 Occlusion and stenosis of unspecified carotid artery: Secondary | ICD-10-CM | POA: Diagnosis not present

## 2015-10-17 DIAGNOSIS — I10 Essential (primary) hypertension: Secondary | ICD-10-CM | POA: Diagnosis not present

## 2015-10-17 DIAGNOSIS — G20 Parkinson's disease: Secondary | ICD-10-CM | POA: Diagnosis not present

## 2015-10-18 ENCOUNTER — Ambulatory Visit: Payer: Commercial Managed Care - HMO | Admitting: Physical Therapy

## 2015-10-18 DIAGNOSIS — R2689 Other abnormalities of gait and mobility: Secondary | ICD-10-CM

## 2015-10-18 DIAGNOSIS — R269 Unspecified abnormalities of gait and mobility: Secondary | ICD-10-CM

## 2015-10-18 DIAGNOSIS — R293 Abnormal posture: Secondary | ICD-10-CM | POA: Diagnosis not present

## 2015-10-18 DIAGNOSIS — R29898 Other symptoms and signs involving the musculoskeletal system: Secondary | ICD-10-CM | POA: Diagnosis not present

## 2015-10-18 DIAGNOSIS — R2681 Unsteadiness on feet: Secondary | ICD-10-CM | POA: Diagnosis not present

## 2015-10-18 NOTE — Therapy (Signed)
[facility] [address] [place], [place], [address] Phone: [phone]   Fax:  [phone]  Physical Therapy Treatment  Patient Details  Name: Wendy Reynolds MRN: [id number] Date of Birth: 31-Dec-1940 Referring Provider: Sonia Baller  Encounter Date: 10/18/2015      PT End of Session - 10/18/15 1134    Visit Number 16   Number of Visits 21   Date for PT Re-Evaluation 11/07/15   Authorization Type [employer]- No Auth required   PT Start Time 3016  Pt arrives late for this visit   PT Stop Time 0845   PT Time Calculation (min) 46 min   Activity Tolerance Patient tolerated treatment well   Behavior During Therapy [facility] for tasks assessed/performed      Past Medical History  Diagnosis Date  . Panic   . Hypertension   . Depression   . Generalized anxiety disorder 03/04/2012  . Alcohol use ([place])   . Diastolic dysfunction 0/07/930    Grade 1. EF 60%  . Chest pain 03/04/2012    MI ruled out. Likely anxiety related.  . Hyperlipidemia 03/05/2012  . Parkinson disease ([place])   . Hypothyroid   . Parkinson's disease [facility])     Past Surgical History  Procedure Laterality Date  . Dilation and curettage of uterus      There were no vitals filed for this visit.  Visit Diagnosis:  Postural instability  Festinating gait  Abnormality of gait      Subjective Assessment - 10/18/15 0801    Subjective Had 4 really good days-only one fall since PT last visit due to rushing out of the house; even backed up the wheelchair in the grocery store without falling.   Patient Stated Goals Pt's goal for therapy is to have less falls and to not freeze up so much with walking.   Currently in Pain? No/denies                         [facility] Adult PT Treatment/Exercise - 10/18/15 1139    Ambulation/Gait   Ambulation/Gait Yes  BIG walking   Ambulation/Gait Assistance 6: Modified independent (Device/Increase time)   Ambulation/Gait Assistance Details Cues for posture and increased step length/arm swing   Ambulation Distance (Feet) 700 Feet   Assistive device None   Gait Pattern Step-through pattern   Ambulation Surface Level;Indoor            LSVT [facility]) - 10/18/15 0802    Floor to ceiling 10 reps  10 flicks last rep   Side to side 10 reps  10 flicks last rep   Step and Reach Forward 10 reps  bilateral coordinated UE movements   Step and Reach Backward 10 reps  bilateral coordinated arms   Step and Reach Sideways 10 reps  bilateral UE coordinated movements   Rock and Reach Forward/Backward 10 reps  bilateral coordinated UE movements   Rock and Reach Sideways 10 reps  coordinated UE movements-cues for visual target of hands   1 - Sit to stand 10 reps  each from 20", 18", 16" surfaces   Other Tasks 1 BIG sidestep to sit at varied heights 12 reps with cues for BIG deliberate effort and pacing   Other Tasks 2 Sidesteps at counter, 6 reps each direction, cues for maximal BIG step and reset posture between changes of direction   Other Tasks 3 Stair negotiation x 5 reps with no rail ascending,  rail descending, step through pattern with cues for BIG foot placement.   Other task Forward/backward walking at counter, multiple reps, with cues for BIG, deliberate steps, pacing/counting for slowed steps in backward direction            PT Education - 10/18/15 1133    Education provided Yes   Education Details Provided patient with information/discussed possibility of participating in weekly PWR! Moves exercise class after discharge from therapy    Person(s) Educated Patient   Methods Explanation;Handout   Comprehension Verbalized understanding          PT Short Term Goals - 09/22/15 0945    PT SHORT TERM GOAL #1   Title Pt will verbalize understanding of fall prevention within the home environment.  TARGET 09/23/15   Time 2   Period Weeks   Status Achieved   PT SHORT TERM GOAL #2    Title Pt will verbalize/demonstrate understanding of tips to prevent freezing with gait and turns.   Time 4   Period Weeks   Status Partially Met   PT SHORT TERM GOAL #3   Title Pt will perform at least 8 of 10 reps of sit<>stand transfers from 18 inch surfaces or lower with no posterior lean or loss of balance, for improved safety/efficiency with transfers.   Time 4   Period Weeks   Status On-going           PT Long Term Goals - 09/07/15 1638    PT LONG TERM GOAL #1   Title Pt will be independent with HEP, including LSVT BIG exercises.  TARGET 11/21/15   Time 6   Period Weeks   Status New   PT LONG TERM GOAL #2   Title Pt will improve Functional Gait Assessment score to at least 18/30 for decreased risk of falls.   Time 6   Period Weeks   Status New   PT LONG TERM GOAL #3   Title Pt will improve 4-square step test score to less than or equal to 12 seconds with no LOB for decreased fall risk.   Time 6   Period Weeks   Status New   PT LONG TERM GOAL #4   Title Pt will report at least 25% reduction in falls over 4 week period for improved balance/decreased falling frequency.   Time 6   Period Weeks   Status New               Plan - 10/18/15 1135    Clinical Impression Statement Pt cancelled last visit due to oversleeping.  Pt running late today, and feels in a rush.  PT focused much time and cueing today on slowed, deliberate, pacing of BIG movement patterns.  Will continue to reinforce through maximal daily exercises, functional tasks, and hierarchy tasks.  Will check goals last visit this week and discuss discharge.   Pt will benefit from skilled therapeutic intervention in order to improve on the following deficits Abnormal gait;Decreased balance;Decreased mobility;Decreased safety awareness;Decreased coordination;Decreased strength;Difficulty walking   Rehab Potential Good   PT Frequency 2x / week   PT Duration 2 weeks  then 4x/wk 4 wks LSVT BIG   PT  Treatment/Interventions ADLs/Self Care Home Management;Gait training;Functional mobility training;Therapeutic activities;Therapeutic exercise;Neuromuscular re-education;Balance training;Patient/family education   PT Next Visit Plan LSVT BIG exercises, functional/carryover tasks- -progress as patient is able; outdoor gait activity; continued forward/backwards walking; review all hierarchy and functional tasks   Consulted and Agree with Plan of Care Patient  Problem List Patient Active Problem List   Diagnosis Date Noted  . UTI (lower urinary tract infection) 09/15/2014  . Acute kidney injury ([place]) 09/15/2014  . Acute encephalopathy 09/15/2014  . Orthostatic hypotension 09/15/2014  . Hypoglycemia 09/15/2014  . Frequent falls   . Renal insufficiency 09/14/2014  . Parkinson's disease (tremor, stiffness, slow motion, unstable posture) ([place]) 05/15/2013  . 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 Diastolic dysfunction 31/06/1623  . Hyperlipidemia 03/05/2012  . Chest pain 03/04/2012  . Generalized anxiety disorder 03/04/2012  . HTN (hypertension) 03/04/2012  . Depression 03/04/2012    Erik Nessel W. 10/18/2015, 11:40 AM  Frazier Butt., PT  [facility] [address] [place], [place], [address] Phone: [phone]   Fax:  [phone]  Name: Wendy Reynolds MRN: [id number] Date of Birth: 1941/06/14

## 2015-10-19 ENCOUNTER — Ambulatory Visit: Payer: Commercial Managed Care - HMO | Admitting: Physical Therapy

## 2015-10-19 DIAGNOSIS — T6591XA Toxic effect of unspecified substance, accidental (unintentional), initial encounter: Secondary | ICD-10-CM | POA: Diagnosis not present

## 2015-10-19 DIAGNOSIS — R29898 Other symptoms and signs involving the musculoskeletal system: Secondary | ICD-10-CM

## 2015-10-19 DIAGNOSIS — R293 Abnormal posture: Secondary | ICD-10-CM

## 2015-10-19 DIAGNOSIS — R2681 Unsteadiness on feet: Secondary | ICD-10-CM | POA: Diagnosis not present

## 2015-10-19 DIAGNOSIS — R269 Unspecified abnormalities of gait and mobility: Secondary | ICD-10-CM | POA: Diagnosis not present

## 2015-10-19 DIAGNOSIS — R2689 Other abnormalities of gait and mobility: Secondary | ICD-10-CM

## 2015-10-19 DIAGNOSIS — Z1389 Encounter for screening for other disorder: Secondary | ICD-10-CM | POA: Diagnosis not present

## 2015-10-19 DIAGNOSIS — E039 Hypothyroidism, unspecified: Secondary | ICD-10-CM | POA: Diagnosis not present

## 2015-10-19 DIAGNOSIS — Z681 Body mass index (BMI) 19 or less, adult: Secondary | ICD-10-CM | POA: Diagnosis not present

## 2015-10-19 DIAGNOSIS — I10 Essential (primary) hypertension: Secondary | ICD-10-CM | POA: Diagnosis not present

## 2015-10-19 DIAGNOSIS — E063 Autoimmune thyroiditis: Secondary | ICD-10-CM | POA: Diagnosis not present

## 2015-10-19 NOTE — Therapy (Signed)
[facility] [address] [place], [place], [address] Phone: [phone]   Fax:  [phone]  Physical Therapy Treatment  Patient Details  Name: Wendy Reynolds MRN: [id number] Date of Birth: [birth year] Referring Provider: Sonia Baller  Encounter Date: 10/19/2015      PT End of Session - 10/20/15 0622    Visit Number 17   Number of Visits 21   Date for PT Re-Evaluation 11/07/15   Authorization Type [employer]- No Auth required   PT Start Time 0752   PT Stop Time 0848   PT Time Calculation (min) 56 min   Activity Tolerance Patient tolerated treatment well   Behavior During Therapy [facility] for tasks assessed/performed      Past Medical History  Diagnosis Date  . Panic   . Hypertension   . Depression   . Generalized anxiety disorder 03/04/2012  . Alcohol use ([place])   . Diastolic dysfunction 09/29/2023    Grade 1. EF 60%  . Chest pain 03/04/2012    MI ruled out. Likely anxiety related.  . Hyperlipidemia 03/05/2012  . Parkinson disease ([place])   . Hypothyroid   . Parkinson's disease [facility])     Past Surgical History  Procedure Laterality Date  . Dilation and curettage of uterus      There were no vitals filed for this visit.  Visit Diagnosis:  Postural instability  Festinating gait  Abnormality of gait  Muscle rigidity      Subjective Assessment - 10/19/15 0754    Subjective Take the medication in the morning and that seems to make me dizzy (BP 151/91).  [place] quite a bit yesterday-just didn't sleep well the night before.   Currently in Pain? No/denies                         [facility] Adult PT Treatment/Exercise - 10/20/15 0001    Ambulation/Gait   Ambulation/Gait Yes  BIG walking   Ambulation/Gait Assistance 6: Modified independent (Device/Increase time)   Ambulation/Gait Assistance Details Initial posture cues and cues for maximal BIG effort   Ambulation Distance (Feet) 700 Feet   including starts/stops/turns/"peel off" BIG steps   Assistive device None   Gait Pattern Step-through pattern   Ambulation Surface Level;Indoor            LSVT [facility]) - 10/18/15 0802    Floor to ceiling 10 reps  10 flicks last rep   Side to side 10 reps  10 flicks last rep   Step and Reach Forward 10 reps  bilateral coordinated UE movements   Step and Reach Backward 10 reps  bilateral coordinated arms   Step and Reach Sideways 10 reps  bilateral UE coordinated movements   Rock and Reach Forward/Backward 12 reps each foot position bilateral coordinated UE movements   Rock and Reach Sideways 10 reps -cues provided to increase visual target to "2:00" coordinated UE movements-cues for visual target of hands   1 - Sit to stand 12 reps  each from 20", 18", 16", 15" (compliant) surfaces   Other Tasks 1 BIG sidestep to sit at varied heights 12 reps with cues for BIG deliberate effort and pacing-improved pacing and stepping today   Other Tasks 2 Sidesteps at counter, 8 reps each direction, cues for maximal BIG step and reset posture between changes of direction   Other Tasks 3 Stair negotiation x 8 reps with no rail ascending, rail descending, step through pattern with  cues for BIG foot placement.  Progression to carrying item with stair negotiation.   Other task Forward/backward walking at counter, multiple reps, with cues for BIG, deliberate steps, pacing/counting for slowed steps in backward direction    Hiearchy tasks of pushing/pulling open doors in clinic area, including forward walking, then backing up/transitions in forward/back direction, progressing to carrying objects through doorway.  Practiced multiple reps, no loss of balance, no festinating noted in posterior direction.  Therapeutic Activity:  Simulated horse stall tasks, including grooming, with cues for BIG wide stance, BIG UE movements for grooming tasks, BIG side step and BIG "peel out step" to change directions. Also  simulated using large bolster ball on mat-getting up onto and off of horse, to problem solve through pt's LOB when getting off horse.  Practiced several reps, with min assist, with cues for widened BOS and quick posterior step/lunge (with flexed hips) to prevent LOB in posterior direction.  Pt able to verbalize and demo understanding.          PT Short Term Goals - 09/22/15 0945    PT SHORT TERM GOAL #1   Title Pt will verbalize understanding of fall prevention within the home environment.  TARGET 09/23/15   Time 2   Period Weeks   Status Achieved   PT SHORT TERM GOAL #2   Title Pt will verbalize/demonstrate understanding of tips to prevent freezing with gait and turns.   Time 4   Period Weeks   Status Partially Met   PT SHORT TERM GOAL #3   Title Pt will perform at least 8 of 10 reps of sit<>stand transfers from 18 inch surfaces or lower with no posterior lean or loss of balance, for improved safety/efficiency with transfers.   Time 4   Period Weeks   Status On-going           PT Long Term Goals - 09/07/15 1638    PT LONG TERM GOAL #1   Title Pt will be independent with HEP, including LSVT BIG exercises.  TARGET 11/21/15   Time 6   Period Weeks   Status New   PT LONG TERM GOAL #2   Title Pt will improve Functional Gait Assessment score to at least 18/30 for decreased risk of falls.   Time 6   Period Weeks   Status New   PT LONG TERM GOAL #3   Title Pt will improve 4-square step test score to less than or equal to 12 seconds with no LOB for decreased fall risk.   Time 6   Period Weeks   Status New   PT LONG TERM GOAL #4   Title Pt will report at least 25% reduction in falls over 4 week period for improved balance/decreased falling frequency.   Time 6   Period Weeks   Status New               Plan - 10/20/15 4128    Clinical Impression Statement Pt appears to be moving more fluidly today, less festinating/freezing of gait in turns and tight spaces.  Pt  continues to improve with independence of maximal daily exercises and functional tasks.   Pt will benefit from skilled therapeutic intervention in order to improve on the following deficits Abnormal gait;Decreased balance;Decreased mobility;Decreased safety awareness;Decreased coordination;Decreased strength;Difficulty walking   Rehab Potential Good   PT Frequency 2x / week   PT Duration 2 weeks  then 4x/wk 4 wks LSVT BIG   PT Treatment/Interventions ADLs/Self Care Home Management;Gait  training;Functional mobility training;Therapeutic activities;Therapeutic exercise;Neuromuscular re-education;Balance training;Patient/family education   PT Next Visit Plan LSVT BIG exercises, functional/carryover tasks- -progress as patient is able; outdoor gait activity; continued forward/backwards walking; review all hierarchy and functional tasks   Consulted and Agree with Plan of Care Patient        Problem List Patient Active Problem List   Diagnosis Date Noted  . UTI (lower urinary tract infection) 09/15/2014  . Acute kidney injury ([place]) 09/15/2014  . Acute encephalopathy 09/15/2014  . Orthostatic hypotension 09/15/2014  . Hypoglycemia 09/15/2014  . Frequent falls   . Renal insufficiency 09/14/2014  . Parkinson's disease (tremor, stiffness, slow motion, unstable posture) ([place]) 05/15/2013  . 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 Diastolic dysfunction 86/76/1950  . Hyperlipidemia 03/05/2012  . Chest pain 03/04/2012  . Generalized anxiety disorder 03/04/2012  . HTN (hypertension) 03/04/2012  . Depression 03/04/2012    Frazier Butt. 10/20/2015, 6:26 AM  Frazier Butt., PT  [facility] [address] [place], [place], [address] Phone: [phone]   Fax:  [phone]  Name: FERN CANOVA MRN: [id number] Date of Birth: June 25, 1941

## 2015-10-20 ENCOUNTER — Ambulatory Visit: Payer: Self-pay | Admitting: Physical Therapy

## 2015-10-21 ENCOUNTER — Ambulatory Visit: Payer: Commercial Managed Care - HMO | Admitting: Physical Therapy

## 2015-10-25 ENCOUNTER — Ambulatory Visit: Payer: Self-pay | Admitting: Physical Therapy

## 2015-10-26 ENCOUNTER — Ambulatory Visit: Payer: Commercial Managed Care - HMO | Admitting: Physical Therapy

## 2015-10-26 ENCOUNTER — Ambulatory Visit: Payer: Self-pay | Admitting: Physical Therapy

## 2015-10-26 DIAGNOSIS — R2681 Unsteadiness on feet: Secondary | ICD-10-CM

## 2015-10-26 DIAGNOSIS — R2689 Other abnormalities of gait and mobility: Secondary | ICD-10-CM

## 2015-10-26 DIAGNOSIS — R293 Abnormal posture: Secondary | ICD-10-CM | POA: Diagnosis not present

## 2015-10-26 DIAGNOSIS — R269 Unspecified abnormalities of gait and mobility: Secondary | ICD-10-CM | POA: Diagnosis not present

## 2015-10-26 DIAGNOSIS — R29898 Other symptoms and signs involving the musculoskeletal system: Secondary | ICD-10-CM | POA: Diagnosis not present

## 2015-10-27 ENCOUNTER — Ambulatory Visit: Payer: Self-pay | Admitting: Physical Therapy

## 2015-10-27 NOTE — Therapy (Signed)
[facility] [address] [place], [place], [address] Phone: [phone]   Fax:  [phone]  Physical Therapy Treatment  Patient Details  Name: Wendy Reynolds MRN: [id number] Date of Birth: Sep 02, 1940 Referring Provider: Sonia Baller  Encounter Date: 10/26/2015      PT End of Session - 10/27/15 1000    Visit Number 18   Number of Visits 21   Date for PT Re-Evaluation 11/07/15   Authorization Type [employer]- No Auth required   PT Start Time [phone]   PT Stop Time 0930   PT Time Calculation (min) 41 min   Activity Tolerance Patient tolerated treatment well   Behavior During Therapy [facility] for tasks assessed/performed      Past Medical History  Diagnosis Date  . Panic   . Hypertension   . Depression   . Generalized anxiety disorder 03/04/2012  . Alcohol use ([place])   . Diastolic dysfunction 09/02/5359    Grade 1. EF 60%  . Chest pain 03/04/2012    MI ruled out. Likely anxiety related.  . Hyperlipidemia 03/05/2012  . Parkinson disease ([place])   . Hypothyroid   . Parkinson's disease [facility])     Past Surgical History  Procedure Laterality Date  . Dilation and curettage of uterus      There were no vitals filed for this visit.  Visit Diagnosis:  Other abnormalities of gait and mobility  Unsteadiness on feet      Subjective Assessment - 10/26/15 0855    Subjective Had panic/anxiety attack last week-went to the doctor and they changed my medications.  Been doing my exercises and walking at home, and have not had any falls the past 3 days.   Currently in Pain? No/denies            [facility] PT Assessment - 10/26/15 0901    Transfers   Transfers Sit to Stand;Stand to Sit   Sit to Stand 6: Modified independent (Device/Increase time);Without upper extremity assist;From chair/3-in-1   Five time sit to stand comments  9.31   Stand to Sit 6: Modified independent (Device/Increase time)   Timed Up and Go Test    Normal TUG (seconds) 9.11   Cognitive TUG (seconds) 9.41   Four Square Step Test    Trial One  12.21  14.56 with min guard assist   Functional Gait  Assessment   Gait assessed  Yes   Gait Level Surface Walks 20 ft in less than 5.5 sec, no assistive devices, good speed, no evidence for imbalance, normal gait pattern, deviates no more than 6 in outside of the 12 in walkway width.  5.31   Change in Gait Speed Able to change speed, demonstrates mild gait deviations, deviates 6-10 in outside of the 12 in walkway width, or no gait deviations, unable to achieve a major change in velocity, or uses a change in velocity, or uses an assistive device.   Gait with Horizontal Head Turns Performs head turns smoothly with slight change in gait velocity (eg, minor disruption to smooth gait path), deviates 6-10 in outside 12 in walkway width, or uses an assistive device.   Gait with Vertical Head Turns Performs task with slight change in gait velocity (eg, minor disruption to smooth gait path), deviates 6 - 10 in outside 12 in walkway width or uses assistive device   Gait and Pivot Turn Pivot turns safely within 3 sec and stops quickly with no loss of balance.  2.4  Step Over Obstacle Is able to step over one shoe box (4.5 in total height) but must slow down and adjust steps to clear box safely. May require verbal cueing.   Gait with Narrow Base of Support Is able to ambulate for 10 steps heel to toe with no staggering.   Gait with Eyes Closed Walks 20 ft, uses assistive device, slower speed, mild gait deviations, deviates 6-10 in outside 12 in walkway width. Ambulates 20 ft in less than 9 sec but greater than 7 sec.  5.62   Ambulating Backwards Walks 20 ft, uses assistive device, slower speed, mild gait deviations, deviates 6-10 in outside 12 in walkway width.  17.96   Steps Alternating feet, must use rail.   Total Score 22   FGA comment: Improved from 13/30                     [facility] Adult PT  Treatment/Exercise - 10/26/15 0901    Ambulation/Gait   Ambulation/Gait Yes   Ambulation/Gait Assistance 7: Independent   Gait velocity 7.96 sec = 4.12 ft/sec                PT Education - 10/27/15 0959    Education provided Yes   Education Details Reviewed importance of continued HEP, walking program, continuing LSVT BIG functional and hierarchy activities upon D/C; discussed follow-up eval 6+ months after eval   Person(s) Educated Patient   Methods Explanation;Handout   Comprehension Verbalized understanding          PT Short Term Goals - 10/26/15 0857    PT SHORT TERM GOAL #1   Title Pt will verbalize understanding of fall prevention within the home environment.  TARGET 09/23/15   Time 2   Period Weeks   Status Achieved   PT SHORT TERM GOAL #2   Title Pt will verbalize/demonstrate understanding of tips to prevent freezing with gait and turns.   Time 4   Period Weeks   Status Partially Met   PT SHORT TERM GOAL #3   Title Pt will perform at least 8 of 10 reps of sit<>stand transfers from 18 inch surfaces or lower with no posterior lean or loss of balance, for improved safety/efficiency with transfers.   Baseline met 10/26/15 from 18" and 16" surfaces   Time 4   Period Weeks   Status Achieved           PT Long Term Goals - 10/26/15 0859    PT LONG TERM GOAL #1   Title Pt will be independent with HEP, including LSVT BIG exercises.  TARGET 11/21/15   Time 6   Period Weeks   Status Partially Met   PT LONG TERM GOAL #2   Title Pt will improve Functional Gait Assessment score to at least 18/30 for decreased risk of falls.   Baseline 10/26/15:  score of 22/30   Time 6   Period Weeks   Status Achieved   PT LONG TERM GOAL #3   Title Pt will improve 4-square step test score to less than or equal to 12 seconds with no LOB for decreased fall risk.   Baseline 12.21 seconds on 10/26/15   Time 6   Period Weeks   Status Not Met   PT LONG TERM GOAL #4   Title Pt will  report at least 25% reduction in falls over 4 week period for improved balance/decreased falling frequency.   Baseline Pt reports at least 50% reduction in falls  Time 6   Period Weeks   Status Achieved               Plan - 10/27/15 1000    Clinical Impression Statement Pt had to cancel appts last week-pt returns this visit for goal check and discharge.  STG# 3 met.  LTG #1 partially met, as pt needs cues at times for independence with HEP; LTG #2, 4 met.  LTG #3 for 4-square step test partially met, as pt improved, but time on FSST is 12.21 sec.  Pt overall has improved functional mobility and reported reduced falls.  Pt is appropriate for discharge at this time.   Pt will benefit from skilled therapeutic intervention in order to improve on the following deficits Abnormal gait;Decreased balance;Decreased mobility;Decreased safety awareness;Decreased coordination;Decreased strength;Difficulty walking   Rehab Potential Good   PT Frequency 2x / week   PT Duration 2 weeks  then 4x/wk 4 wks LSVT BIG   PT Treatment/Interventions ADLs/Self Care Home Management;Gait training;Functional mobility training;Therapeutic activities;Therapeutic exercise;Neuromuscular re-education;Balance training;Patient/family education   PT Next Visit Plan Discharge from PT today.  Recommended return to PT in 6-9 months due to progressive nature of pt's disease.   Consulted and Agree with Plan of Care Patient          G-Codes - 2015/11/24 1005    Functional Assessment Tool Used 5x sit<>stand 9.31 sec no posterior lean; TUG 9.11 sec, 4-square step test 12.21 sec, FGA 22/30   Functional Limitation Mobility: Walking and moving around   Mobility: Walking and Moving Around Goal Status [phone]) At least 20 percent but less than 40 percent impaired, limited or restricted   Mobility: Walking and Moving Around Discharge Status [phone]) At least 20 percent but less than 40 percent impaired, limited or restricted       Problem List Patient Active Problem List   Diagnosis Date Noted  . UTI (lower urinary tract infection) 09/15/2014  . Acute kidney injury ([place]) 09/15/2014  . Acute encephalopathy 09/15/2014  . Orthostatic hypotension 09/15/2014  . Hypoglycemia 09/15/2014  . Frequent falls   . Renal insufficiency 09/14/2014  . Parkinson's disease (tremor, stiffness, slow motion, unstable posture) ([place]) 05/15/2013  . 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 Diastolic dysfunction 52/84/1324  . Hyperlipidemia 03/05/2012  . Chest pain 03/04/2012  . Generalized anxiety disorder 03/04/2012  . HTN (hypertension) 03/04/2012  . Depression 03/04/2012    Analis Distler W. 10/27/2015, 10:08 AM  Frazier Butt., PT  [facility] [address] [place], [place], [address] Phone: [phone]   Fax:  [phone]  Name: Wendy Reynolds MRN: [id number] Date of Birth: 1941-07-22    PHYSICAL THERAPY DISCHARGE SUMMARY  Visits from Start of Care: 18  Current functional level related to goals / functional outcomes: See goals check above   Remaining deficits: [place] of gait, balance, falls (reduced from start of therapy)   Education / Equipment: Pt has been educated in fall prevention, tips to reduce freezing with gait, HEP, community exercise opportunities. Plan: Patient agrees to discharge.  Patient goals were partially met. Patient is being discharged due to being pleased with the current functional level.  ?????Recommend return to PT in 6-9 months for evaluation and follow-up based on progression of pt's disease course.   Mady Haagensen, PT 10/27/2015 10:12 AM Phone: [phone] Fax: [phone]

## 2015-11-01 ENCOUNTER — Ambulatory Visit: Payer: Self-pay | Admitting: Physical Therapy

## 2015-11-02 ENCOUNTER — Ambulatory Visit: Payer: Self-pay | Admitting: Physical Therapy

## 2015-11-03 ENCOUNTER — Ambulatory Visit: Payer: Self-pay | Admitting: Physical Therapy

## 2015-11-04 ENCOUNTER — Ambulatory Visit: Payer: Self-pay | Admitting: Physical Therapy

## 2015-12-02 ENCOUNTER — Ambulatory Visit (HOSPITAL_COMMUNITY): Payer: Self-pay | Admitting: Psychiatry

## 2016-01-27 DIAGNOSIS — E063 Autoimmune thyroiditis: Secondary | ICD-10-CM | POA: Diagnosis not present

## 2016-01-27 DIAGNOSIS — G20 Parkinson's disease: Secondary | ICD-10-CM | POA: Diagnosis not present

## 2016-01-27 DIAGNOSIS — Z681 Body mass index (BMI) 19 or less, adult: Secondary | ICD-10-CM | POA: Diagnosis not present

## 2016-03-17 ENCOUNTER — Emergency Department (HOSPITAL_COMMUNITY): Payer: Commercial Managed Care - HMO

## 2016-03-17 ENCOUNTER — Emergency Department (HOSPITAL_COMMUNITY)
Admission: EM | Admit: 2016-03-17 | Discharge: 2016-03-17 | Disposition: A | Discharge status: Home / Self Care | Payer: Commercial Managed Care - HMO | Attending: Emergency Medicine | Admitting: Emergency Medicine

## 2016-03-17 ENCOUNTER — Encounter (HOSPITAL_COMMUNITY): Payer: Self-pay | Admitting: *Deleted

## 2016-03-17 DIAGNOSIS — Y939 Activity, unspecified: Secondary | ICD-10-CM | POA: Diagnosis not present

## 2016-03-17 DIAGNOSIS — Y92002 Bathroom of unspecified non-institutional (private) residence single-family (private) house as the place of occurrence of the external cause: Secondary | ICD-10-CM | POA: Insufficient documentation

## 2016-03-17 DIAGNOSIS — I10 Essential (primary) hypertension: Secondary | ICD-10-CM | POA: Insufficient documentation

## 2016-03-17 DIAGNOSIS — Z23 Encounter for immunization: Secondary | ICD-10-CM | POA: Insufficient documentation

## 2016-03-17 DIAGNOSIS — W208XXA Other cause of strike by thrown, projected or falling object, initial encounter: Secondary | ICD-10-CM | POA: Insufficient documentation

## 2016-03-17 DIAGNOSIS — M25422 Effusion, left elbow: Secondary | ICD-10-CM

## 2016-03-17 DIAGNOSIS — Z7982 Long term (current) use of aspirin: Secondary | ICD-10-CM | POA: Diagnosis not present

## 2016-03-17 DIAGNOSIS — G20 Parkinson's disease: Secondary | ICD-10-CM | POA: Diagnosis not present

## 2016-03-17 DIAGNOSIS — Z79899 Other long term (current) drug therapy: Secondary | ICD-10-CM | POA: Diagnosis not present

## 2016-03-17 DIAGNOSIS — Y999 Unspecified external cause status: Secondary | ICD-10-CM | POA: Diagnosis not present

## 2016-03-17 DIAGNOSIS — S51012A Laceration without foreign body of left elbow, initial encounter: Secondary | ICD-10-CM | POA: Diagnosis not present

## 2016-03-17 MED ORDER — TETANUS-DIPHTH-ACELL PERTUSSIS 5-2.5-18.5 LF-MCG/0.5 IM SUSP
0.5000 mL | Freq: Once | INTRAMUSCULAR | Status: AC
  Administered 2016-03-17: 0.5 mL via INTRAMUSCULAR
  Filled 2016-03-17: qty 0.5

## 2016-03-17 MED ORDER — LIDOCAINE HCL (PF) 1 % IJ SOLN
5.0000 mL | Freq: Once | INTRAMUSCULAR | Status: AC
  Administered 2016-03-17: 5 mL
  Filled 2016-03-17: qty 5

## 2016-03-17 NOTE — Discharge Instructions (Signed)
The laceration to your elbow was repaired with a dissolvable stitch. These will fall out in about 7-10 days on their own. Please see your doctor, or return to the emergency department if any unusual redness, unusual swelling, fever, or signs of advancing infection. Your x-ray shows fluid in the elbow joint (an effusion). We sometimes see this in a silent fracture. Please have Dr.Fusco recheck your left elbow, and re-x-ray your left elbow in 7-10 days.

## 2016-03-17 NOTE — ED Triage Notes (Signed)
Pt states she is getting her bathroom renovated and had a door with no hinges fall on her left arm. Pt has laceration to her left elbow with swelling noted. Pt takes asa daily.

## 2016-03-17 NOTE — ED Notes (Signed)
Patient states she does not need sling, she has one at home she will use. Verbal instructions given.

## 2016-03-17 NOTE — ED Provider Notes (Signed)
[facility]-EMER[facility]Y DEPT Provider Note   CSN: [id number] Arrival date & time: 03/17/16  0940     History   Chief Complaint Chief Complaint  Patient presents with  . Laceration    HPI Wendy Reynolds is a 75 y.o. female.  Patient is a 75 year old female who presents to the emergency department with a complaint of left elbow pain and bleeding area  The patient states that she is having some work done on her home on. Her shower curtain got caught in the door of her bathroom, and the door fell on her left elbow. She states that it is a heavy wooden door. She sustained a laceration to the elbow area. She states that she has had quite a few problems with swelling of this particular elbow in the past. She denies any problem with use of the left shoulder, wrist, or fingers. She denies any injury to her chest or face. She denies being on any anticoagulation medications at this time.    Laceration      Past Medical History:  Diagnosis Date  . Alcohol use ([facility])   . Chest pain 03/04/2012   MI ruled out. Likely anxiety related.  . Depression   . Diastolic dysfunction 03/05/2012   Grade 1. EF 60%  . Generalized anxiety disorder 03/04/2012  . Hyperlipidemia 03/05/2012  . Hypertension   . Hypothyroid   . Panic   . Parkinson disease ([facility])   . Parkinson's disease [facility](HCC)     Patient Active Problem List   Diagnosis Date Noted  . UTI (lower urinary tract infection) 09/15/2014  . Acute kidney injury ([facility]) 09/15/2014  . Acute encephalopathy 09/15/2014  . Orthostatic hypotension 09/15/2014  . Hypoglycemia 09/15/2014  . Frequent falls   . Renal insufficiency 09/14/2014  . Parkinson's disease (tremor, stiffness, slow motion, unstable posture) ([facility]) 05/15/2013  . 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 Diastolic dysfunction 03/05/2012  . Hyperlipidemia 03/05/2012  . Chest pain 03/04/2012  . Generalized anxiety disorder 03/04/2012  . HTN (hypertension) 03/04/2012   . Depression 03/04/2012    Past Surgical History:  Procedure Laterality Date  . DILATION AND CURETTAGE OF UTERUS      OB History    No data available       Home Medications    Prior to Admission medications   Medication Sig Start Date End Date Taking? Authorizing Provider  aspirin 81 MG chewable tablet Chew 81 mg by mouth daily.   Yes Historical Provider, MD  carbidopa-levodopa (SINEMET IR) 25-100 MG tablet Take 1 tablet by mouth 3 (three) times daily.   Yes Historical Provider, MD  cholecalciferol (VITAMIN D) 1000 units tablet Take 1,000 Units by mouth daily.   Yes Historical Provider, MD  clonazePAM (KLONOPIN) 2 MG tablet Take 3 mg by mouth at bedtime.    Yes Historical Provider, MD  DULoxetine (CYMBALTA) 60 MG capsule Take 1 capsule (60 mg total) by mouth daily. 09/06/15  Yes Myrlene Brokereborah R Ross, MD  hydrOXYzine (ATARAX/VISTARIL) 25 MG tablet Take 25 mg by mouth at bedtime as needed (sleep).   Yes Historical Provider, MD  levothyroxine (SYNTHROID, LEVOTHROID) 25 MCG tablet Take 25 mcg by mouth daily. 04/23/14  Yes Historical Provider, MD  lisinopril (PRINIVIL,ZESTRIL) 5 MG tablet Take 5 mg by mouth daily.  03/02/15  Yes Historical Provider, MD  Multiple Vitamin (MULTIVITAMIN WITH MINERALS) TABS tablet Take 1 tablet by mouth daily.   Yes Historical Provider, MD    Family History  Family History  Problem Relation Age of Onset  . Depression Maternal Grandfather   . Depression Cousin   . Alcohol abuse Cousin   . Depression Daughter   . Alcohol abuse Mother   . Alcohol abuse Father   . Alcohol abuse Maternal Aunt     Social History Social History  Substance Use Topics  . Smoking status: Never Smoker  . Smokeless tobacco: Never Used  . Alcohol use Yes     Comment: 2 glasses a night     Allergies   Erythromycin and Penicillins   Review of Systems Review of Systems  Musculoskeletal: Positive for arthralgias.  Psychiatric/Behavioral: The patient is nervous/anxious.   All  other systems reviewed and are negative.    Physical Exam Updated Vital Signs BP 114/63 (BP Location: Right Arm)   Pulse 67   Temp 97.7 F (36.5 C) (Oral)   Resp 16   Ht 5\' 2"  (1.575 m)   Wt 47.6 kg   SpO2 99%   BMI 19.20 kg/m   Physical Exam  Constitutional: She is oriented to person, place, and time. She appears well-developed and well-nourished.  Non-toxic appearance.  HENT:  Head: Normocephalic.  Right Ear: Tympanic membrane and external ear normal.  Left Ear: Tympanic membrane and external ear normal.  Eyes: EOM and lids are normal. Pupils are equal, round, and reactive to light.  Neck: Normal range of motion. Neck supple. Carotid bruit is not present.  Cardiovascular: Normal rate, regular rhythm, normal heart sounds, intact distal pulses and normal pulses.   Pulmonary/Chest: Breath sounds normal. No respiratory distress.  Abdominal: Soft. Bowel sounds are normal. There is no tenderness. There is no guarding.  Musculoskeletal: She exhibits tenderness.  There are degenerative changes of the right and left elbow, wrist, and fingers. There is a 1 cm laceration to the lateral portion of the left elbow. There is swelling of the left elbow. The patient states that this is not new. The radial pulses are 2+. Capillary refill is less than 2 seconds.  Lymphadenopathy:       Head (right side): No submandibular adenopathy present.       Head (left side): No submandibular adenopathy present.    She has no cervical adenopathy.  Neurological: She is alert and oriented to person, place, and time. She has normal strength. No cranial nerve deficit or sensory deficit.  Skin: Skin is warm and dry.  Psychiatric: She has a normal mood and affect. Her speech is normal.  Nursing note and vitals reviewed.    ED Treatments / Results  Labs (all labs ordered are listed, but only abnormal results are displayed) Labs Reviewed - No data to display  EKG  EKG Interpretation None        Radiology Dg Elbow Complete Left  Result Date: 03/17/2016 CLINICAL DATA:  Pain following fall EXAM: LEFT ELBOW - COMPLETE 3+ VIEW COMPARISON:  None. FINDINGS: Frontal, lateral, and bilateral oblique views were obtained. There is no evident acute fracture or dislocation. There is an equivocal joint effusion, however. There is no appreciable joint space narrowing. No soft tissue air. IMPRESSION: Equivocal joint effusion, a finding concerning for potential subtle fracture. Fracture not seen. No dislocation. No apparent arthropathy. Given equivocal joint effusion, it may be prudent to consider immobilization with repeat imaging in 7-10 days to further assess. Electronically Signed   By: Bretta BangWilliam  Woodruff III M.D.   On: 03/17/2016 10:10    Procedures .Marland Kitchen.Laceration Repair Date/Time: 03/17/2016 12:48 PM Performed by: Beverely PaceBRYANT,  Lynnie Koehler Authorized by: Ivery Quale   Consent:    Consent obtained:  Verbal   Consent given by:  Patient   Risks discussed:  Infection and pain Laceration details:    Location:  Shoulder/arm   Shoulder/arm location:  L elbow   Length (cm):  1.3 Repair type:    Repair type:  Simple Pre-procedure details:    Preparation:  Patient was prepped and draped in usual sterile fashion Exploration:    Hemostasis achieved with:  Direct pressure   Wound exploration: wound explored through full range of motion     Wound extent: no muscle damage noted, no nerve damage noted and no tendon damage noted     Contaminated: no   Treatment:    Area cleansed with:  Betadine   Amount of cleaning:  Standard   Irrigation solution:  Sterile saline Skin repair:    Repair method:  Sutures   Suture size:  4-0   Wound skin closure material used: Vicryl-Rapide.   Suture technique:  Simple interrupted   Number of sutures:  3 Approximation:    Approximation:  Close Post-procedure details:    Dressing:  Sterile dressing   Patient tolerance of procedure:  Tolerated well, no immediate  complications   (including critical care time)  Medications Ordered in ED Medications  lidocaine (PF) (XYLOCAINE) 1 % injection 5 mL (5 mLs Other Given by Other 03/17/16 1134)     Initial Impression / Assessment and Plan / ED Course Tetanus status updated   I have reviewed the triage vital signs and the nursing notes.  Pertinent labs & imaging results that were available during my care of the patient were reviewed by me and considered in my medical decision making (see chart for details).  Clinical Course    *I have reviewed nursing notes, vital signs, and all appropriate lab and imaging results for this patient.**  Final Clinical Impressions(s) / ED Diagnoses  Patient had a heavy door to fall on her left arm/elbow. She sustained a laceration. X-ray shows an effusion present. The patient states that she has had fluid in the joint of the elbow and problems with the elbow for quite some time. Radiologists reserves concern for possible occult fracture. The patient will be placed in a sling on. The laceration was repaired with dissolvable sutures. Patient is been given instructions on care for the laceration, and the need to return if signs of infection. We also discussed the need for her to see her primary physician for recheck of the x-ray in 7-10 days. Patient knowledge is understanding of this instruction.    Final diagnoses:  None    New Prescriptions New Prescriptions   No medications on file     Ivery Quale, PA-C 03/17/16 1252    Samuel Jester, DO 03/17/16 1542

## 2016-03-21 ENCOUNTER — Other Ambulatory Visit (HOSPITAL_COMMUNITY): Payer: Self-pay | Admitting: Registered Nurse

## 2016-03-21 ENCOUNTER — Ambulatory Visit (HOSPITAL_COMMUNITY)
Admission: RE | Admit: 2016-03-21 | Discharge: 2016-03-21 | Disposition: A | Discharge status: Home / Self Care | Payer: Commercial Managed Care - HMO | Source: Ambulatory Visit | Attending: Registered Nurse | Admitting: Registered Nurse

## 2016-03-21 DIAGNOSIS — M25422 Effusion, left elbow: Secondary | ICD-10-CM | POA: Diagnosis not present

## 2016-03-21 DIAGNOSIS — S59902A Unspecified injury of left elbow, initial encounter: Secondary | ICD-10-CM | POA: Insufficient documentation

## 2016-03-21 DIAGNOSIS — S42412A Displaced simple supracondylar fracture without intercondylar fracture of left humerus, initial encounter for closed fracture: Secondary | ICD-10-CM | POA: Insufficient documentation

## 2016-03-21 DIAGNOSIS — W19XXXA Unspecified fall, initial encounter: Secondary | ICD-10-CM | POA: Insufficient documentation

## 2016-03-21 DIAGNOSIS — S59902D Unspecified injury of left elbow, subsequent encounter: Secondary | ICD-10-CM

## 2016-03-21 DIAGNOSIS — E782 Mixed hyperlipidemia: Secondary | ICD-10-CM | POA: Diagnosis not present

## 2016-03-21 DIAGNOSIS — S42422A Displaced comminuted supracondylar fracture without intercondylar fracture of left humerus, initial encounter for closed fracture: Secondary | ICD-10-CM | POA: Diagnosis not present

## 2016-03-21 DIAGNOSIS — I10 Essential (primary) hypertension: Secondary | ICD-10-CM | POA: Diagnosis not present

## 2016-03-21 DIAGNOSIS — Z681 Body mass index (BMI) 19 or less, adult: Secondary | ICD-10-CM | POA: Diagnosis not present

## 2016-03-26 ENCOUNTER — Encounter: Payer: Self-pay | Admitting: Orthopedic Surgery

## 2016-03-26 ENCOUNTER — Ambulatory Visit (INDEPENDENT_AMBULATORY_CARE_PROVIDER_SITE_OTHER): Payer: Commercial Managed Care - HMO | Admitting: Orthopedic Surgery

## 2016-03-26 VITALS — BP 122/74 | HR 69 | Ht 62.0 in | Wt 104.0 lb

## 2016-03-26 DIAGNOSIS — S42452A Displaced fracture of lateral condyle of left humerus, initial encounter for closed fracture: Secondary | ICD-10-CM | POA: Diagnosis not present

## 2016-03-26 DIAGNOSIS — Z681 Body mass index (BMI) 19 or less, adult: Secondary | ICD-10-CM | POA: Diagnosis not present

## 2016-03-26 DIAGNOSIS — L0390 Cellulitis, unspecified: Secondary | ICD-10-CM | POA: Diagnosis not present

## 2016-03-26 NOTE — Progress Notes (Signed)
Chief Complaint  Patient presents with  . Elbow Injury    left elbow injury 03/17/16   HPI  75 year old female fell on August 19. Injured her left elbow at the hospital for initial films there were inconclusive she's had several falls that since then and had a repeat x-ray 3 days later which showed a lateral condyle fracture which was not easily visible on the first film and may have been the new injury.  Comes in complaining of pain swelling and somewhat loss of motion which is nonradiating located on the lateral side left elbow. She is a retired Adult nursephysical therapist and has been performing active range of motion exercises to tolerance.  Review of Systems  Neurological: Positive for tremors and speech change.  Psychiatric/Behavioral: Positive for depression. The patient is nervous/anxious.   All other systems reviewed and are negative.   Past Medical History:  Diagnosis Date  . Alcohol use ([facility])   . Chest pain 03/04/2012   MI ruled out. Likely anxiety related.  . Depression   . Diastolic dysfunction 03/05/2012   Grade 1. EF 60%  . Generalized anxiety disorder 03/04/2012  . Hyperlipidemia 03/05/2012  . Hypertension   . Hypothyroid   . Panic   . Parkinson disease ([facility])   . Parkinson's disease [facility](HCC)     Past Surgical History:  Procedure Laterality Date  . DILATION AND CURETTAGE OF UTERUS     Family History  Problem Relation Age of Onset  . Depression Maternal Grandfather   . Depression Cousin   . Alcohol abuse Cousin   . Depression Daughter   . Alcohol abuse Mother   . Alcohol abuse Father   . Alcohol abuse Maternal Aunt    Social History  Substance Use Topics  . Smoking status: Never Smoker  . Smokeless tobacco: Never Used  . Alcohol use Yes     Comment: 2 glasses a night    Current Outpatient Prescriptions:  .  aspirin 81 MG chewable tablet, Chew 81 mg by mouth daily., Disp: , Rfl:  .  carbidopa-levodopa (SINEMET IR) 25-100 MG tablet, Take 1 tablet by mouth 3 (three)  times daily., Disp: , Rfl:  .  cholecalciferol (VITAMIN D) 1000 units tablet, Take 1,000 Units by mouth daily., Disp: , Rfl:  .  clonazePAM (KLONOPIN) 2 MG tablet, Take 3 mg by mouth at bedtime. , Disp: , Rfl:  .  DULoxetine (CYMBALTA) 60 MG capsule, Take 1 capsule (60 mg total) by mouth daily., Disp: 30 capsule, Rfl: 2 .  hydrOXYzine (ATARAX/VISTARIL) 25 MG tablet, Take 25 mg by mouth at bedtime as needed (sleep)., Disp: , Rfl:  .  levothyroxine (SYNTHROID, LEVOTHROID) 25 MCG tablet, Take 25 mcg by mouth daily., Disp: , Rfl:  .  lisinopril (PRINIVIL,ZESTRIL) 5 MG tablet, Take 5 mg by mouth daily. , Disp: , Rfl:  .  Multiple Vitamin (MULTIVITAMIN WITH MINERALS) TABS tablet, Take 1 tablet by mouth daily., Disp: , Rfl:   BP 122/74   Pulse 69   Ht 5\' 2"  (1.575 m)   Wt 104 lb (47.2 kg)   BMI 19.02 kg/m   Physical Exam  Constitutional: She is oriented to person, place, and time. She appears well-developed and well-nourished. No distress.  Cardiovascular: Normal rate, regular rhythm and intact distal pulses.   Musculoskeletal:  Abnormal shuffling gait with small short steps  Neurological: She is alert and oriented to person, place, and time. She exhibits abnormal muscle tone.  Skin: Capillary refill takes less than 2 seconds.  She is not diaphoretic. There is erythema.  Psychiatric: She has a normal mood and affect. Her behavior is normal. Judgment and thought content normal.    Ortho Exam  There is an abrasion over the left elbow and has erythema and tenderness around it. Question whether this is evidence of an old open fracture or just an abrasion. It looks to be cellulitic.  Her range of motion 5 extension-115 flexion. Tenderness over the lateral condyle. Stable valgus stress test weakness normal muscle tone erythema of the skin as stated normal distal pulses no lymphadenopathy in the left side normal sensation.   ASSESSMENT: My personal interpretation of the images:  2 sets of  images one on the 19th one on the 22nd. I see no obvious fracture on the 19th but I do see obvious fracture on the 22nd  Lateral condyle fracture Encounter Diagnosis  Name Primary?  . Closed fracture of lateral condyle of distal humerus, left, initial encounter Yes       PLAN Elbow range of motion brace full range of motion  X-ray will be needed at 4 weeks postop and I need to check the wound next week after she starts the doxycycline   Fuller Canada, MD 03/26/2016 3:26 PM

## 2016-03-26 NOTE — Patient Instructions (Addendum)
Start doxycycline  May remove brace to sleep and base

## 2016-03-27 ENCOUNTER — Ambulatory Visit: Payer: Self-pay | Admitting: Orthopedic Surgery

## 2016-03-27 DIAGNOSIS — S42452A Displaced fracture of lateral condyle of left humerus, initial encounter for closed fracture: Secondary | ICD-10-CM | POA: Diagnosis not present

## 2016-04-03 ENCOUNTER — Ambulatory Visit: Payer: Commercial Managed Care - HMO | Admitting: Physical Therapy

## 2016-04-05 ENCOUNTER — Emergency Department (HOSPITAL_COMMUNITY): Payer: Commercial Managed Care - HMO

## 2016-04-05 ENCOUNTER — Encounter (HOSPITAL_COMMUNITY): Payer: Self-pay | Admitting: Emergency Medicine

## 2016-04-05 ENCOUNTER — Emergency Department (HOSPITAL_COMMUNITY)
Admission: EM | Admit: 2016-04-05 | Discharge: 2016-04-05 | Disposition: A | Discharge status: Home / Self Care | Payer: Commercial Managed Care - HMO | Attending: Emergency Medicine | Admitting: Emergency Medicine

## 2016-04-05 DIAGNOSIS — G20 Parkinson's disease: Secondary | ICD-10-CM | POA: Insufficient documentation

## 2016-04-05 DIAGNOSIS — I10 Essential (primary) hypertension: Secondary | ICD-10-CM | POA: Diagnosis not present

## 2016-04-05 DIAGNOSIS — E039 Hypothyroidism, unspecified: Secondary | ICD-10-CM | POA: Insufficient documentation

## 2016-04-05 DIAGNOSIS — Y92000 Kitchen of unspecified non-institutional (private) residence as  the place of occurrence of the external cause: Secondary | ICD-10-CM | POA: Diagnosis not present

## 2016-04-05 DIAGNOSIS — Z79899 Other long term (current) drug therapy: Secondary | ICD-10-CM | POA: Insufficient documentation

## 2016-04-05 DIAGNOSIS — S0990XA Unspecified injury of head, initial encounter: Secondary | ICD-10-CM | POA: Diagnosis present

## 2016-04-05 DIAGNOSIS — S43432A Superior glenoid labrum lesion of left shoulder, initial encounter: Secondary | ICD-10-CM | POA: Diagnosis not present

## 2016-04-05 DIAGNOSIS — Y939 Activity, unspecified: Secondary | ICD-10-CM | POA: Insufficient documentation

## 2016-04-05 DIAGNOSIS — S0003XA Contusion of scalp, initial encounter: Secondary | ICD-10-CM | POA: Insufficient documentation

## 2016-04-05 DIAGNOSIS — W19XXXA Unspecified fall, initial encounter: Secondary | ICD-10-CM

## 2016-04-05 DIAGNOSIS — W1801XA Striking against sports equipment with subsequent fall, initial encounter: Secondary | ICD-10-CM | POA: Insufficient documentation

## 2016-04-05 DIAGNOSIS — Y999 Unspecified external cause status: Secondary | ICD-10-CM | POA: Diagnosis not present

## 2016-04-05 MED ORDER — BACITRACIN-NEOMYCIN-POLYMYXIN 400-5-5000 EX OINT
TOPICAL_OINTMENT | Freq: Every day | CUTANEOUS | Status: DC
  Administered 2016-04-05: 1 via TOPICAL
  Filled 2016-04-05: qty 1

## 2016-04-05 NOTE — Discharge Instructions (Signed)
CT scan of head shows no acute findings. Keep your turban dressing on for 2 days. At that time, you can gently shower. Tylenol for headache.

## 2016-04-05 NOTE — ED Notes (Signed)
Bulky dressing applied to head.

## 2016-04-05 NOTE — ED Provider Notes (Signed)
[facility]PT Provider Note   CSN: [id number] Arrival date & time: 04/05/16  1338     History   Chief Complaint Chief Complaint  Patient presents with  . Fall    HPI Wendy Reynolds is a 75 y.o. female.  Patient has known Parkinson's disease and falls often. Today she accidentally fell and struck her left parietal temporal area. No loss of consciousness or neurological deficits. No neck pain. No truncal or extremity tenderness. There is a laceration in the above area.      Past Medical History:  Diagnosis Date  . Alcohol use (HCC)   . Chest pain 03/04/2012   MI ruled out. Likely anxiety related.  . Depression   . Diastolic dysfunction 03/05/2012   Grade 1. EF 60%  . Generalized anxiety disorder 03/04/2012  . Hyperlipidemia 03/05/2012  . Hypertension   . Hypothyroid   . Panic   . Parkinson disease (HCC)   . Parkinson's disease [facility](HCC)     Patient Active Problem List   Diagnosis Date Noted  . UTI (lower urinary tract infection) 09/15/2014  . Acute kidney injury (HCC) 09/15/2014  . Acute encephalopathy 09/15/2014  . Orthostatic hypotension 09/15/2014  . Hypoglycemia 09/15/2014  . Frequent falls   . Renal insufficiency 09/14/2014  . Parkinson's disease (tremor, stiffness, slow motion, unstable posture) (HCC) 05/15/2013  . 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 Diastolic dysfunction 03/05/2012  . Hyperlipidemia 03/05/2012  . Chest pain 03/04/2012  . Generalized anxiety disorder 03/04/2012  . HTN (hypertension) 03/04/2012  . Depression 03/04/2012    Past Surgical History:  Procedure Laterality Date  . DILATION AND CURETTAGE OF UTERUS      OB History    No data available       Home Medications    Prior to Admission medications   Medication Sig Start Date End Date Taking? Authorizing Provider  carbidopa-levodopa (SINEMET IR) 25-100 MG tablet Take 1 tablet by mouth 3 (three) times daily.   Yes Historical Provider, MD    cholecalciferol (VITAMIN D) 1000 units tablet Take 1,000 Units by mouth daily.   Yes Historical Provider, MD  clonazePAM (KLONOPIN) 2 MG tablet Take 3 mg by mouth at bedtime.    Yes Historical Provider, MD  DULoxetine (CYMBALTA) 60 MG capsule Take 1 capsule (60 mg total) by mouth daily. 09/06/15  Yes Myrlene Brokereborah R Ross, MD  hydrOXYzine (ATARAX/VISTARIL) 25 MG tablet Take 25 mg by mouth at bedtime as needed (sleep).   Yes Historical Provider, MD  levothyroxine (SYNTHROID, LEVOTHROID) 25 MCG tablet Take 25 mcg by mouth daily. 04/23/14  Yes Historical Provider, MD  lisinopril (PRINIVIL,ZESTRIL) 5 MG tablet Take 5 mg by mouth daily.  03/02/15  Yes Historical Provider, MD  Multiple Vitamin (MULTIVITAMIN WITH MINERALS) TABS tablet Take 1 tablet by mouth daily.   Yes Historical Provider, MD    Family History Family History  Problem Relation Age of Onset  . Depression Maternal Grandfather   . Depression Cousin   . Alcohol abuse Cousin   . Depression Daughter   . Alcohol abuse Mother   . Alcohol abuse Father   . Alcohol abuse Maternal Aunt     Social History Social History  Substance Use Topics  . Smoking status: Never Smoker  . Smokeless tobacco: Never Used  . Alcohol use Yes     Comment: 2 glasses a night     Allergies   Erythromycin and Penicillins   Review of Systems Review  of Systems  All other systems reviewed and are negative.    Physical Exam Updated Vital Signs BP 130/86 (BP Location: Right Arm)   Pulse 86   Temp 98.7 F (37.1 C) (Oral)   Resp 18   Ht 5\' 2"  (1.575 m)   Wt 104 lb (47.2 kg)   SpO2 100%   BMI 19.02 kg/m   Physical Exam  Constitutional: She is oriented to person, place, and time. She appears well-developed and well-nourished.  HENT:  Head: Normocephalic.  Superficial gouge/abrasion approximately 2 cm in diameter in left temporoparietal area  Eyes: Conjunctivae are normal.  Neck: Neck supple.  Cardiovascular: Normal rate and regular rhythm.    Pulmonary/Chest: Effort normal and breath sounds normal.  Abdominal: Soft. Bowel sounds are normal.  Musculoskeletal:  Tender over left elbow (not new)  Neurological: She is alert and oriented to person, place, and time.  Skin: Skin is warm and dry.  Psychiatric: She has a normal mood and affect. Her behavior is normal.  Nursing note and vitals reviewed.    ED Treatments / Results  Labs (all labs ordered are listed, but only abnormal results are displayed) Labs Reviewed - No data to display  EKG  EKG Interpretation None       Radiology Dg Elbow Complete Left  Result Date: 04/05/2016 CLINICAL DATA:  Follow-up fracture 3 weeks ago due to fall, fell again today, having increased pain EXAM: LEFT ELBOW - COMPLETE 3+ VIEW COMPARISON:  03/21/2016 FINDINGS: Mild osseous demineralization. Joint spaces preserved. Again identified fracture of the lateral humeral epicondyle. Extension of fracture into the capitellar articular surface on previous exam is less well visualized on the current study. Medial and dorsal soft tissue swelling. Large elbow joint effusion present. No additional fracture, dislocation, or bone destruction. IMPRESSION: Subacute fracture of the lateral humeral epicondyle,known to extend to the capitellar articular surface, less well demonstrated on current study. Associated soft tissue swelling and joint effusion. No new bony abnormalities. Electronically Signed   By: Ulyses Southward M.D.   On: 04/05/2016 16:17   Ct Head Wo Contrast  Result Date: 04/05/2016 CLINICAL DATA:  Fall with left temporal parietal head injury. Initial encounter. EXAM: CT HEAD WITHOUT CONTRAST TECHNIQUE: Contiguous axial images were obtained from the base of the skull through the vertex without intravenous contrast. COMPARISON:  Head CT 09/14/2014. FINDINGS: Brain: There is no evidence of acute intracranial hemorrhage, mass lesion, brain edema or extra-axial fluid collection. Stable mild generalized atrophy.  No evidence of hydrocephalus. There is no CT evidence of acute cortical infarction. Vascular: Intracranial vascular calcifications are present. No hyperdense vascular sign. Skull: There is a left parietal scalp hematoma. No evidence of calvarial fracture. Sinuses/Orbits: The visualized paranasal sinuses and mastoid air cells are clear. Other: None. IMPRESSION: 1. Left parietal scalp hematoma. 2. No evidence of calvarial fracture or acute intracranial injury. Electronically Signed   By: Carey Bullocks M.D.   On: 04/05/2016 16:39    Procedures Procedures (including critical care time)  Medications Ordered in ED Medications - No data to display   Initial Impression / Assessment and Plan / ED Course  I have reviewed the triage vital signs and the nursing notes.  Pertinent labs & imaging results that were available during my care of the patient were reviewed by me and considered in my medical decision making (see chart for details).  Clinical Course    Patient has Parkinson's disease. She has frequent falls. She fell today striking her left parietal scalp. No  loss of consciousness or neurological deficits.  No suturing is necessary for the wound.  Patient has a known lateral epicondylar fracture of the left elbow. She has orthopedic follow-up tomorrow.  Final Clinical Impressions(s) / ED Diagnoses   Final diagnoses:  Fall, initial encounter  Contusion of scalp, initial encounter    New Prescriptions Discharge Medication List as of 04/05/2016  6:35 PM       Donnetta Hutching, MD 04/05/16 2318

## 2016-04-05 NOTE — ED Triage Notes (Signed)
PT states she lost her balance in the kitchen and fell to the floor hitting her left side of her head with small laceration noted and hit her left arm on the tile floor. PT denies any LOC and drover herself to the ED.

## 2016-04-06 ENCOUNTER — Encounter: Payer: Self-pay | Admitting: Orthopedic Surgery

## 2016-04-06 ENCOUNTER — Ambulatory Visit (INDEPENDENT_AMBULATORY_CARE_PROVIDER_SITE_OTHER): Payer: Commercial Managed Care - HMO | Admitting: Orthopedic Surgery

## 2016-04-06 VITALS — BP 131/47 | HR 73 | Ht 62.0 in | Wt 104.0 lb

## 2016-04-06 DIAGNOSIS — S42452A Displaced fracture of lateral condyle of left humerus, initial encounter for closed fracture: Secondary | ICD-10-CM

## 2016-04-06 MED ORDER — DOXYCYCLINE HYCLATE 100 MG PO CAPS: 100.0000 mg | ORAL_CAPSULE | Freq: Two times a day (BID) | ORAL | 0 refills | Status: DC

## 2016-04-06 NOTE — Patient Instructions (Signed)
Brace continue   Continue doxycycline

## 2016-04-06 NOTE — Progress Notes (Signed)
Patient ID: Wendy Reynolds, female   DOB: 10/31/1940, 75 y.o.   MRN: [id number]  Chief Complaint  Patient presents with  . Follow-up    LEFT DISTAL HUMERUS FRACTURE, DOI 03/17/16    HPI Wendy Reynolds is a 75 y.o. female.  F/u wound near fracture elbow  HPI  Review of Systems Review of Systems  xrays yesterady after new fall no change in fracture   Physical Exam BP (!) 131/47   Pulse 73   Ht 5\' 2"  (1.575 m)   Wt 104 lb (47.2 kg)   BMI 19.02 kg/m   Wound clean   Continue doxycycline   Elbow brace   3 weeks xrays

## 2016-04-23 DIAGNOSIS — G20 Parkinson's disease: Secondary | ICD-10-CM | POA: Diagnosis not present

## 2016-04-23 DIAGNOSIS — F330 Major depressive disorder, recurrent, mild: Secondary | ICD-10-CM | POA: Diagnosis not present

## 2016-04-23 DIAGNOSIS — Z681 Body mass index (BMI) 19 or less, adult: Secondary | ICD-10-CM | POA: Diagnosis not present

## 2016-04-23 DIAGNOSIS — F329 Major depressive disorder, single episode, unspecified: Secondary | ICD-10-CM | POA: Diagnosis not present

## 2016-04-23 DIAGNOSIS — F419 Anxiety disorder, unspecified: Secondary | ICD-10-CM | POA: Diagnosis not present

## 2016-04-27 ENCOUNTER — Ambulatory Visit (INDEPENDENT_AMBULATORY_CARE_PROVIDER_SITE_OTHER): Payer: Commercial Managed Care - HMO | Admitting: Orthopedic Surgery

## 2016-04-27 ENCOUNTER — Ambulatory Visit (INDEPENDENT_AMBULATORY_CARE_PROVIDER_SITE_OTHER): Payer: Commercial Managed Care - HMO

## 2016-04-27 VITALS — Ht 62.0 in | Wt 104.0 lb

## 2016-04-27 DIAGNOSIS — S42402D Unspecified fracture of lower end of left humerus, subsequent encounter for fracture with routine healing: Secondary | ICD-10-CM

## 2016-04-27 NOTE — Progress Notes (Signed)
Follow-up status post lateral condyle fracture treated with bracing  Current range of motion 10-1 15  Mild tenderness lateral condyle x-ray shows fracture stable healed with fragmentation of the lateral condyle  Patient is released from care follow-up if any problems

## 2016-05-26 ENCOUNTER — Emergency Department (HOSPITAL_COMMUNITY): Payer: Commercial Managed Care - HMO

## 2016-05-26 ENCOUNTER — Encounter (HOSPITAL_COMMUNITY): Payer: Self-pay | Admitting: Emergency Medicine

## 2016-05-26 ENCOUNTER — Emergency Department (HOSPITAL_COMMUNITY)
Admission: EM | Admit: 2016-05-26 | Discharge: 2016-05-26 | Disposition: A | Discharge status: Home / Self Care | Payer: Commercial Managed Care - HMO | Attending: Emergency Medicine | Admitting: Emergency Medicine

## 2016-05-26 DIAGNOSIS — Y929 Unspecified place or not applicable: Secondary | ICD-10-CM | POA: Diagnosis not present

## 2016-05-26 DIAGNOSIS — Y939 Activity, unspecified: Secondary | ICD-10-CM | POA: Diagnosis not present

## 2016-05-26 DIAGNOSIS — W1839XA Other fall on same level, initial encounter: Secondary | ICD-10-CM | POA: Diagnosis not present

## 2016-05-26 DIAGNOSIS — S62643A Nondisplaced fracture of proximal phalanx of left middle finger, initial encounter for closed fracture: Secondary | ICD-10-CM

## 2016-05-26 DIAGNOSIS — S6992XA Unspecified injury of left wrist, hand and finger(s), initial encounter: Secondary | ICD-10-CM | POA: Diagnosis present

## 2016-05-26 DIAGNOSIS — S62343A Nondisplaced fracture of base of third metacarpal bone, left hand, initial encounter for closed fracture: Secondary | ICD-10-CM | POA: Diagnosis not present

## 2016-05-26 DIAGNOSIS — S62327A Displaced fracture of shaft of fifth metacarpal bone, left hand, initial encounter for closed fracture: Secondary | ICD-10-CM

## 2016-05-26 DIAGNOSIS — I10 Essential (primary) hypertension: Secondary | ICD-10-CM | POA: Insufficient documentation

## 2016-05-26 DIAGNOSIS — Y999 Unspecified external cause status: Secondary | ICD-10-CM | POA: Diagnosis not present

## 2016-05-26 DIAGNOSIS — G20 Parkinson's disease: Secondary | ICD-10-CM | POA: Insufficient documentation

## 2016-05-26 DIAGNOSIS — S62317A Displaced fracture of base of fifth metacarpal bone. left hand, initial encounter for closed fracture: Secondary | ICD-10-CM | POA: Diagnosis not present

## 2016-05-26 DIAGNOSIS — S62315A Displaced fracture of base of fourth metacarpal bone, left hand, initial encounter for closed fracture: Secondary | ICD-10-CM | POA: Insufficient documentation

## 2016-05-26 NOTE — ED Provider Notes (Signed)
[facility] Provider Note   CSN: [id number] Arrival date & time: 05/26/16  1511     History   Chief Complaint Chief Complaint  Patient presents with  . Hand Injury    HPI Wendy Reynolds is a 75 y.o. female.  HPI   75 year old female with left hand&  finger pain. Patient has a history of Parkinson's. She states that she lost her balance and fell backwards. She put her hands back to brace her fall. She said severe persistent pain to the ulnar aspect of her left hand and fourth and fifth digits since then. She did take these 2 digits together which did improve the pain somewhat. No numbness or tingling. Did not think she hit her head. She denies acute pain other than in her hand. She is not on any blood thinners.  Past Medical History:  Diagnosis Date  . Alcohol use   . Chest pain 03/04/2012   MI ruled out. Likely anxiety related.  . Depression   . Diastolic dysfunction 03/05/2012   Grade 1. EF 60%  . Generalized anxiety disorder 03/04/2012  . Hyperlipidemia 03/05/2012  . Hypertension   . Hypothyroid   . Panic   . Parkinson disease (HCC)   . Parkinson's disease [facility])     Patient Active Problem List   Diagnosis Date Noted  . UTI (lower urinary tract infection) 09/15/2014  . Acute kidney injury (HCC) 09/15/2014  . Acute encephalopathy 09/15/2014  . Orthostatic hypotension 09/15/2014  . Hypoglycemia 09/15/2014  . Frequent falls   . Renal insufficiency 09/14/2014  . Parkinson's disease (tremor, stiffness, slow motion, unstable posture) (HCC) 05/15/2013  . 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 Diastolic dysfunction 03/05/2012  . Hyperlipidemia 03/05/2012  . Chest pain 03/04/2012  . Generalized anxiety disorder 03/04/2012  . HTN (hypertension) 03/04/2012  . Depression 03/04/2012    Past Surgical History:  Procedure Laterality Date  . DILATION AND CURETTAGE OF UTERUS      OB History    Gravida Para Term Preterm AB Living   2         2   SAB TAB Ectopic Multiple Live Births                   Home Medications    Prior to Admission medications   Medication Sig Start Date End Date Taking? Authorizing Provider  carbidopa-levodopa (SINEMET IR) 25-100 MG tablet Take 1 tablet by mouth 3 (three) times daily.    Historical Provider, MD  cholecalciferol (VITAMIN D) 1000 units tablet Take 1,000 Units by mouth daily.    Historical Provider, MD  clonazePAM (KLONOPIN) 2 MG tablet Take 3 mg by mouth at bedtime.     Historical Provider, MD  doxycycline (VIBRAMYCIN) 100 MG capsule Take 1 capsule (100 mg total) by mouth 2 (two) times daily. 04/06/16   Vickki Hearing, MD  DULoxetine (CYMBALTA) 60 MG capsule Take 1 capsule (60 mg total) by mouth daily. 09/06/15   Myrlene Broker, MD  hydrOXYzine (ATARAX/VISTARIL) 25 MG tablet Take 25 mg by mouth at bedtime as needed (sleep).    Historical Provider, MD  levothyroxine (SYNTHROID, LEVOTHROID) 25 MCG tablet Take 25 mcg by mouth daily. 04/23/14   Historical Provider, MD  lisinopril (PRINIVIL,ZESTRIL) 5 MG tablet Take 5 mg by mouth daily.  03/02/15   Historical Provider, MD  Multiple Vitamin (MULTIVITAMIN WITH MINERALS) TABS tablet Take 1 tablet by mouth daily.    Historical Provider,  MD    Family History Family History  Problem Relation Age of Onset  . Depression Maternal Grandfather   . Depression Cousin   . Alcohol abuse Cousin   . Depression Daughter   . Alcohol abuse Mother   . Alcohol abuse Father   . Alcohol abuse Maternal Aunt     Social History Social History  Substance Use Topics  . Smoking status: Never Smoker  . Smokeless tobacco: Never Used  . Alcohol use Yes     Comment: 2 glasses a night     Allergies   Erythromycin and Penicillins   Review of Systems Review of Systems  All systems reviewed and negative, other than as noted in HPI.   Physical Exam Updated Vital Signs BP 130/70 (BP Location: Right Arm)   Pulse 67   Temp 98.2 F (36.8 C) (Oral)    Resp 18   Ht 5\' 2"  (1.575 m)   Wt 104 lb (47.2 kg)   SpO2 100%   BMI 19.02 kg/m   Physical Exam  Constitutional: She appears well-developed and well-nourished. No distress.  HENT:  Head: Normocephalic and atraumatic.  Eyes: Conjunctivae are normal. Right eye exhibits no discharge. Left eye exhibits no discharge.  Neck: Neck supple.  Cardiovascular: Normal rate, regular rhythm and normal heart sounds.  Exam reveals no gallop and no friction rub.   No murmur heard. Pulmonary/Chest: Effort normal and breath sounds normal. No respiratory distress.  Abdominal: Soft. She exhibits no distension. There is no tenderness.  Musculoskeletal: She exhibits no edema or tenderness.  Neurological: She is alert.  Skin: Skin is warm and dry.  Psychiatric: She has a normal mood and affect. Her behavior is normal. Thought content normal.  Nursing note and vitals reviewed.    ED Treatments / Results  Labs (all labs ordered are listed, but only abnormal results are displayed) Labs Reviewed - No data to display  EKG  EKG Interpretation None       Radiology Dg Hand Complete Left  Result Date: 05/26/2016 CLINICAL DATA:  Reason region on the floor and fifth metacarpal heads after falling backwards onto her hands today. EXAM: LEFT HAND - COMPLETE 3+ VIEW COMPARISON:  Left thumb dated 10/14/2014. FINDINGS: Oblique fractures of the mid and proximal shafts of the fourth and fifth metacarpals. Mild radial displacement of the distal fragment of the fifth metacarpal fracture and mild dorsal displacement and ventral angulation of the distal fragment of the fourth metacarpal fracture. There is also an essentially nondisplaced fracture in the base of the third proximal phalanx without visible intra-articular extension. There are degenerative changes involving multiple interphalangeal joints. IMPRESSION: 1. Fractures of the fourth and fifth metacarpals and base of the third proximal phalanx, as described  above. 2. 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 Electronically Signed   By: Beckie SaltsSteven  Reid M.D.   On: 05/26/2016 16:20    Procedures Procedures (including critical care time)  Medications Ordered in ED Medications - No data to display   Initial Impression / Assessment and Plan / ED Course  I have reviewed the triage vital signs and the nursing notes.  Pertinent labs & imaging results that were available during my care of the patient were reviewed by me and considered in my medical decision making (see chart for details).  Clinical Course    75 year old female with left hand pain after mechanical fall. Imaging significant for closed fourth and fifth metacarpal fractures as well as a fracture of the proximal phalanx of middle  finger. She is neurovascularly intact. Will splint. Orthopedic follow-up. She is declining prescription for pain medication.  Final Clinical Impressions(s) / ED Diagnoses   Final diagnoses:  Closed displaced fracture of shaft of fifth metacarpal bone of left hand, initial encounter  Closed nondisplaced fracture of proximal phalanx of left middle finger, initial encounter    New Prescriptions New Prescriptions   No medications on file     Raeford RazorStephen Rheanna Sergent, MD 06/04/16 1135

## 2016-05-26 NOTE — ED Triage Notes (Signed)
PT states she has parkinson's disease and lost her balance and slid down a wall and caught herself with her left hand. PT c/o bruising to left hand and painful ROM to smallest digit.

## 2016-05-28 ENCOUNTER — Emergency Department (HOSPITAL_COMMUNITY)
Admission: EM | Admit: 2016-05-28 | Discharge: 2016-05-28 | Disposition: A | Discharge status: Home / Self Care | Payer: Commercial Managed Care - HMO | Attending: Emergency Medicine | Admitting: Emergency Medicine

## 2016-05-28 ENCOUNTER — Encounter (HOSPITAL_COMMUNITY): Payer: Self-pay

## 2016-05-28 DIAGNOSIS — I10 Essential (primary) hypertension: Secondary | ICD-10-CM | POA: Diagnosis not present

## 2016-05-28 DIAGNOSIS — E039 Hypothyroidism, unspecified: Secondary | ICD-10-CM | POA: Insufficient documentation

## 2016-05-28 DIAGNOSIS — S0990XA Unspecified injury of head, initial encounter: Secondary | ICD-10-CM | POA: Diagnosis present

## 2016-05-28 DIAGNOSIS — S0101XA Laceration without foreign body of scalp, initial encounter: Secondary | ICD-10-CM

## 2016-05-28 DIAGNOSIS — G20 Parkinson's disease: Secondary | ICD-10-CM | POA: Insufficient documentation

## 2016-05-28 DIAGNOSIS — Z79899 Other long term (current) drug therapy: Secondary | ICD-10-CM | POA: Insufficient documentation

## 2016-05-28 DIAGNOSIS — Y92009 Unspecified place in unspecified non-institutional (private) residence as the place of occurrence of the external cause: Secondary | ICD-10-CM | POA: Diagnosis not present

## 2016-05-28 DIAGNOSIS — W01198A Fall on same level from slipping, tripping and stumbling with subsequent striking against other object, initial encounter: Secondary | ICD-10-CM | POA: Diagnosis not present

## 2016-05-28 DIAGNOSIS — Y9301 Activity, walking, marching and hiking: Secondary | ICD-10-CM | POA: Diagnosis not present

## 2016-05-28 DIAGNOSIS — Y999 Unspecified external cause status: Secondary | ICD-10-CM | POA: Diagnosis not present

## 2016-05-28 DIAGNOSIS — W19XXXA Unspecified fall, initial encounter: Secondary | ICD-10-CM

## 2016-05-28 MED ORDER — BUPIVACAINE HCL (PF) 0.5 % IJ SOLN
20.0000 mL | Freq: Once | INTRAMUSCULAR | Status: DC
  Filled 2016-05-28: qty 30

## 2016-05-28 NOTE — Discharge Instructions (Signed)
Keep the laceration clean and dry. You can wash your hair today to get the blood out of your hair. You can put antibiotic ointment on the laceration if you want. Ice packs over the area will help with the swelling, pain, and bleeding.  Return to the ED for any problems listed on the head injury sheet or if you think the laceration is getting infected. The staples need to be removed in 1 week, this can be done at your primary care doctor's office, call today to get an appointment.

## 2016-05-28 NOTE — ED Provider Notes (Signed)
[facility] DEPT Provider Note   CSN: [id number] Arrival date & time: 05/28/16  0556  Time seen 06:11 AM   History   Chief Complaint Chief Complaint  Patient presents with  . Fall  . Head Injury    HPI Wendy Reynolds is a 75 y.o. female.  HPI Patient states she has Parkinson's. She got up the middle of the night to check her elderly dog is having trouble getting up. She was walking back to her bedroom about 5:45 AM and she lost her balance and fell and hit the left side of her head on the floor causing a laceration. She denies loss of consciousness. She denies being on blood thinners. She denies any neck or head pain. She denies any pain anywhere. She denies nausea or vomiting. She denies any change in her vision. She does states she is thirsty. She reports her tetanus is up-to-date.  PCP Dr Sherwood GamblerFusco   Past Medical History:  Diagnosis Date  . Alcohol use   . Chest pain 03/04/2012   MI ruled out. Likely anxiety related.  . Depression   . Diastolic dysfunction 03/05/2012   Grade 1. EF 60%  . Generalized anxiety disorder 03/04/2012  . Hyperlipidemia 03/05/2012  . Hypertension   . Hypothyroid   . Panic   . Parkinson disease (HCC)   . Parkinson's disease [facility](HCC)     Patient Active Problem List   Diagnosis Date Noted  . UTI (lower urinary tract infection) 09/15/2014  . Acute kidney injury (HCC) 09/15/2014  . Acute encephalopathy 09/15/2014  . Orthostatic hypotension 09/15/2014  . Hypoglycemia 09/15/2014  . Frequent falls   . Renal insufficiency 09/14/2014  . Parkinson's disease (tremor, stiffness, slow motion, unstable posture) (HCC) 05/15/2013  . 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 Diastolic dysfunction 03/05/2012  . Hyperlipidemia 03/05/2012  . Chest pain 03/04/2012  . Generalized anxiety disorder 03/04/2012  . HTN (hypertension) 03/04/2012  . Depression 03/04/2012    Past Surgical History:  Procedure Laterality Date  .  DILATION AND CURETTAGE OF UTERUS      OB History    Gravida Para Term Preterm AB Living   2         2   SAB TAB Ectopic Multiple Live Births                   Home Medications    Prior to Admission medications   Medication Sig Start Date End Date Taking? Authorizing Provider  carbidopa-levodopa (SINEMET IR) 25-100 MG tablet Take 2 tablets by mouth 3 (three) times daily.     Historical Provider, MD  cholecalciferol (VITAMIN D) 1000 units tablet Take 1,000 Units by mouth daily.    Historical Provider, MD  clonazePAM (KLONOPIN) 2 MG tablet Take 3 mg by mouth at bedtime as needed for anxiety.     Historical Provider, MD  doxycycline (VIBRAMYCIN) 100 MG capsule Take 1 capsule (100 mg total) by mouth 2 (two) times daily. Patient not taking: Reported on 05/26/2016 04/06/16   Vickki HearingStanley E Harrison, MD  DULoxetine (CYMBALTA) 60 MG capsule Take 1 capsule (60 mg total) by mouth daily. 09/06/15   Myrlene Brokereborah R Ross, MD  hydrOXYzine (ATARAX/VISTARIL) 25 MG tablet Take 25 mg by mouth at bedtime as needed (sleep).    Historical Provider, MD  levothyroxine (SYNTHROID, LEVOTHROID) 25 MCG tablet Take 25 mcg by mouth daily. 04/23/14   Historical Provider, MD  lisinopril (PRINIVIL,ZESTRIL) 5 MG tablet Take 5 mg  by mouth daily.  03/02/15   Historical Provider, MD  Multiple Vitamin (MULTIVITAMIN WITH MINERALS) TABS tablet Take 1 tablet by mouth daily.    Historical Provider, MD  Omega-3 Fatty Acids (FISH OIL) 1000 MG CPDR Take 1 capsule by mouth daily.    Historical Provider, MD    Family History Family History  Problem Relation Age of Onset  . Depression Maternal Grandfather   . Depression Cousin   . Alcohol abuse Cousin   . Depression Daughter   . Alcohol abuse Mother   . Alcohol abuse Father   . Alcohol abuse Maternal Aunt     Social History Social History  Substance Use Topics  . Smoking status: Never Smoker  . Smokeless tobacco: Never Used  . Alcohol use Yes     Comment: 2 glasses a night  lives  with daughter who is in a wheelchair   Allergies   Erythromycin and Penicillins   Review of Systems Review of Systems  All other systems reviewed and are negative.    Physical Exam Updated Vital Signs BP 119/69 (BP Location: Right Arm)   Pulse 71   Temp 97.5 F (36.4 C) (Oral)   Resp 20   Ht 5\' 2"  (1.575 m)   Wt 104 lb (47.2 kg)   SpO2 100%   BMI 19.02 kg/m   Vital signs normal    Physical Exam  Constitutional: She is oriented to person, place, and time.  Non-toxic appearance. She does not appear ill. No distress.  Frail elderly female  HENT:  Head: Normocephalic.  Right Ear: External ear normal.  Left Ear: External ear normal.  Nose: Nose normal. No mucosal edema or rhinorrhea.  Mouth/Throat: Oropharynx is clear and moist and mucous membranes are normal. No dental abscesses or uvula swelling.  Pt has a laceration in her left scalp with a lot of blood in her hair and swelling around the laceration.   Eyes: Conjunctivae and EOM are normal. Pupils are equal, round, and reactive to light.  Neck: Normal range of motion and full passive range of motion without pain. Neck supple.  Cardiovascular: Normal rate, regular rhythm and normal heart sounds.  Exam reveals no gallop and no friction rub.   No murmur heard. Pulmonary/Chest: Effort normal and breath sounds normal. No respiratory distress. She has no wheezes. She has no rhonchi. She has no rales. She exhibits no tenderness and no crepitus.  Abdominal: Soft. Normal appearance and bowel sounds are normal. She exhibits no distension. There is no tenderness. There is no rebound and no guarding.  Musculoskeletal: Normal range of motion. She exhibits no edema or tenderness.  Moves all extremities well.  Pt has a splint on her LUE from a fall on Oct 28  Neurological: She is alert and oriented to person, place, and time. She has normal strength. No cranial nerve deficit.  Skin: Skin is warm, dry and intact. No rash noted. No  erythema. No pallor.  Psychiatric: She has a normal mood and affect. Her speech is normal and behavior is normal. Her mood appears not anxious.  Nursing note and vitals reviewed.    ED Treatments / Results  Labs (all labs ordered are listed, but only abnormal results are displayed) Labs Reviewed - No data to display  EKG  EKG Interpretation None       Radiology Dg Hand Complete Left  Result Date: 05/26/2016 CLINICAL DATA:  Reason region on the floor and fifth metacarpal heads after falling backwards onto her hands  today. EXAM: LEFT HAND - COMPLETE 3+ VIEW COMPARISON:  Left thumb dated 10/14/2014. FINDINGS: Oblique fractures of the mid and proximal shafts of the fourth and fifth metacarpals. Mild radial displacement of the distal fragment of the fifth metacarpal fracture and mild dorsal displacement and ventral angulation of the distal fragment of the fourth metacarpal fracture. There is also an essentially nondisplaced fracture in the base of the third proximal phalanx without visible intra-articular extension. There are degenerative changes involving multiple interphalangeal joints. IMPRESSION: 1. Fractures of the fourth and fifth metacarpals and base of the third proximal phalanx, as described above. 2. 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 Electronically Signed   By: Beckie Salts M.D.   On: 05/26/2016 16:20    Procedures Procedures (including critical care time)  LACERATION REPAIR Performed by: Ward Givens Authorized by: Ward Givens Consent: Verbal consent obtained. Risks and benefits: risks, benefits and alternatives were discussed Consent given by: patient Patient identity confirmed: provided demographic data Prepped and Draped in normal sterile fashion Wound explored  Laceration Location: left scalp  Laceration Length: 3 cm  No Foreign Bodies seen or palpated  Anesthesia: local infiltration  Local anesthetic: marcaine 0.5%  Anesthetic  total: 5 ml  Amount of cleaning: standard  Skin closure: staples  Number of staples: 4   Patient tolerance: Patient tolerated the procedure well with no immediate complications.   Medications Ordered in ED Medications  bupivacaine (MARCAINE) 0.5 % injection 20 mL (not administered)     Initial Impression / Assessment and Plan / ED Course  I have reviewed the triage vital signs and the nursing notes.  Pertinent labs & imaging results that were available during my care of the patient were reviewed by me and considered in my medical decision making (see chart for details).  Clinical Course   Patient states she was unable to keep her neurology appointment at [facility] in October and when they rescheduled her next appointment is in May.  Patient's wound was cleaned.  06:40 AM she still denies headache, neck pain or any new injury. CT of head was not done. Patient is awake and alert, she has no worrisome acute neurological symptoms, and she is not on any blood thinners.  Final Clinical Impressions(s) / ED Diagnoses   Final diagnoses:  Fall at home, initial encounter  Scalp laceration, initial encounter   Plan discharge  Devoria Albe, MD, Concha Pyo, MD 05/28/16 [phone]

## 2016-05-28 NOTE — ED Triage Notes (Signed)
Patient slipped and fell in her house this morning.  Hit the left side of her head over the floor.  Laceration noted to left side of head.

## 2016-05-29 ENCOUNTER — Encounter: Payer: Self-pay | Admitting: Orthopaedic Surgery

## 2016-05-29 ENCOUNTER — Ambulatory Visit (INDEPENDENT_AMBULATORY_CARE_PROVIDER_SITE_OTHER): Payer: Commercial Managed Care - HMO | Admitting: Orthopaedic Surgery

## 2016-05-29 VITALS — BP 119/71 | HR 69 | Temp 97.2°F | Ht 62.0 in | Wt 104.0 lb

## 2016-05-29 DIAGNOSIS — S62355A Nondisplaced fracture of shaft of fourth metacarpal bone, left hand, initial encounter for closed fracture: Secondary | ICD-10-CM

## 2016-05-29 DIAGNOSIS — S62643A Nondisplaced fracture of proximal phalanx of left middle finger, initial encounter for closed fracture: Secondary | ICD-10-CM

## 2016-05-29 DIAGNOSIS — G20 Parkinson's disease: Secondary | ICD-10-CM

## 2016-05-29 DIAGNOSIS — S62357A Nondisplaced fracture of shaft of fifth metacarpal bone, left hand, initial encounter for closed fracture: Secondary | ICD-10-CM

## 2016-05-29 NOTE — Progress Notes (Signed)
Patient ZO:XWRUEA:Wendy Reynolds Wendy Reynolds, female DOB:06/06/1941, 75 y.o. VWU:[id number]  Chief Complaint  Patient presents with  . Hand Injury    left hand fracture    HPI  Wendy Reynolds is a 75 y.o. female who has Parkinsonism and fell and hurt her left hand.  She was seen in the ER on 05-26-16.  She has fracture of the left fifth and fourth metacarpals of the shaft nondisplaced and of the base of the proximal phalanx of the long finger.  She has no other injury.  She is falling often. I have suggested a cane and I have given Rx for this.   HPI  Body mass index is 19.02 kg/m.  ROS  Review of Systems  HENT: Negative for congestion.   Respiratory: Negative for cough and shortness of breath.   Cardiovascular: Negative for chest pain and leg swelling.  Endocrine: Positive for cold intolerance.  Musculoskeletal: Positive for arthralgias, gait problem and joint swelling.  Allergic/Immunologic: Positive for environmental allergies.  Neurological: Positive for tremors and weakness.    Past Medical History:  Diagnosis Date  . Alcohol use   . Chest pain 03/04/2012   MI ruled out. Likely anxiety related.  . Depression   . Diastolic dysfunction 03/05/2012   Grade 1. EF 60%  . Generalized anxiety disorder 03/04/2012  . Hyperlipidemia 03/05/2012  . Hypertension   . Hypothyroid   . Panic   . Parkinson disease (HCC)   . Parkinson's disease [facility](HCC)     Past Surgical History:  Procedure Laterality Date  . DILATION AND CURETTAGE OF UTERUS      Family History  Problem Relation Age of Onset  . Depression Maternal Grandfather   . Depression Cousin   . Alcohol abuse Cousin   . Depression Daughter   . Alcohol abuse Mother   . Alcohol abuse Father   . Alcohol abuse Maternal Aunt     Social History Social History  Substance Use Topics  . Smoking status: Never Smoker  . Smokeless tobacco: Never Used  . Alcohol use Yes     Comment: 2 glasses a night    Allergies  Allergen Reactions  . Erythromycin  Nausea And Vomiting  . Penicillins Hives    Has patient had a PCN reaction causing immediate rash, facial/tongue/throat swelling, SOB or lightheadedness with hypotension: Yes Has patient had a PCN reaction causing severe rash involving mucus membranes or skin necrosis: No Has patient had a PCN reaction that required hospitalization No Has patient had a PCN reaction occurring within the last 10 years: No If all of the above answers are "NO", then may proceed with Cephalosporin use.     Current Outpatient Prescriptions  Medication Sig Dispense Refill  . carbidopa-levodopa (SINEMET IR) 25-100 MG tablet Take 2 tablets by mouth 3 (three) times daily.     . cholecalciferol (VITAMIN D) 1000 units tablet Take 1,000 Units by mouth daily.    . clonazePAM (KLONOPIN) 2 MG tablet Take 3 mg by mouth at bedtime as needed for anxiety.     Marland Kitchen. doxycycline (VIBRAMYCIN) 100 MG capsule Take 1 capsule (100 mg total) by mouth 2 (two) times daily. 28 capsule 0  . DULoxetine (CYMBALTA) 60 MG capsule Take 1 capsule (60 mg total) by mouth daily. 30 capsule 2  . hydrOXYzine (ATARAX/VISTARIL) 25 MG tablet Take 25 mg by mouth at bedtime as needed (sleep).    Marland Kitchen. levothyroxine (SYNTHROID, LEVOTHROID) 25 MCG tablet Take 25 mcg by mouth daily.    .Marland Kitchen  lisinopril (PRINIVIL,ZESTRIL) 5 MG tablet Take 5 mg by mouth daily.     . Multiple Vitamin (MULTIVITAMIN WITH MINERALS) TABS tablet Take 1 tablet by mouth daily.    . Omega-3 Fatty Acids (FISH OIL) 1000 MG CPDR Take 1 capsule by mouth daily.     No current facility-administered medications for this visit.      Physical Exam  Blood pressure 119/71, pulse 69, temperature 97.2 F (36.2 C), height 5\' 2"  (1.575 m), weight 104 lb (47.2 kg).  Constitutional: overall normal hygiene, normal nutrition, well developed, normal grooming, normal body habitus. Assistive device:splint left hand  Musculoskeletal: gait and station Limp none, muscle tone and strength are normal, no tremors  or atrophy is present.  .  Neurological: coordination overall normal.  Deep tendon reflex/nerve stretch intact.  Sensation normal.  Cranial nerves II-XII intact.   Skin:   Normal overall no scars, lesions, ulcers or rashes. No psoriasis.  Psychiatric: Alert and oriented x 3.  Recent memory intact, remote memory unclear.  Normal mood and affect. Well groomed.  Good eye contact.  Cardiovascular: overall no swelling, no varicosities, no edema bilaterally, normal temperatures of the legs and arms, no clubbing, cyanosis and good capillary refill.  Lymphatic: palpation is normal.  She has swelling and ecchymosis of the left hand with decreased motion.  NV intact.  The patient has been educated about the nature of the problem(s) and counseled on treatment options.  The patient appeared to understand what I have discussed and is in agreement with it.  Encounter Diagnoses  Name Primary?  . Closed nondisplaced fracture of shaft of fifth metacarpal bone of left hand, initial encounter Yes  . Closed nondisplaced fracture of shaft of fourth metacarpal bone of left hand, initial encounter   . Closed nondisplaced fracture of proximal phalanx of left middle finger, initial encounter   . Primary Parkinsonism [facility](HCC)     PLAN Call if any problems.  Precautions discussed.  Continue current medications.   Return to clinic 1 week   X-rays on return in the cast of the left hand.  A splint was applied to the left hand.  Electronically Signed Darreld McleanWayne Zakya Halabi, MD 10/31/201710:19 AM

## 2016-06-05 ENCOUNTER — Ambulatory Visit (INDEPENDENT_AMBULATORY_CARE_PROVIDER_SITE_OTHER): Payer: Self-pay | Admitting: Orthopaedic Surgery

## 2016-06-05 ENCOUNTER — Encounter: Payer: Self-pay | Admitting: Orthopaedic Surgery

## 2016-06-05 ENCOUNTER — Ambulatory Visit (INDEPENDENT_AMBULATORY_CARE_PROVIDER_SITE_OTHER): Payer: Commercial Managed Care - HMO

## 2016-06-05 VITALS — BP 117/68 | HR 77 | Temp 97.5°F | Ht 62.0 in | Wt 105.0 lb

## 2016-06-05 DIAGNOSIS — E782 Mixed hyperlipidemia: Secondary | ICD-10-CM | POA: Diagnosis not present

## 2016-06-05 DIAGNOSIS — S62347D Nondisplaced fracture of base of fifth metacarpal bone. left hand, subsequent encounter for fracture with routine healing: Secondary | ICD-10-CM

## 2016-06-05 DIAGNOSIS — S6292XD Unspecified fracture of left wrist and hand, subsequent encounter for fracture with routine healing: Secondary | ICD-10-CM | POA: Diagnosis not present

## 2016-06-05 DIAGNOSIS — Z681 Body mass index (BMI) 19 or less, adult: Secondary | ICD-10-CM | POA: Diagnosis not present

## 2016-06-05 DIAGNOSIS — S62345D Nondisplaced fracture of base of fourth metacarpal bone, left hand, subsequent encounter for fracture with routine healing: Secondary | ICD-10-CM

## 2016-06-05 DIAGNOSIS — S0191XD Laceration without foreign body of unspecified part of head, subsequent encounter: Secondary | ICD-10-CM | POA: Diagnosis not present

## 2016-06-05 DIAGNOSIS — S62643D Nondisplaced fracture of proximal phalanx of left middle finger, subsequent encounter for fracture with routine healing: Secondary | ICD-10-CM

## 2016-06-05 NOTE — Progress Notes (Signed)
CC:  My hand is not hurting but I am falling a lot  She fell today while taking her dog out early this morning. She has fallen often secondary to her Parkinsonism.  She has no new left hand pain.  NV intact left hand.  Splint is OK.  Encounter Diagnoses  Name Primary?  . Closed fracture of left hand with routine healing, subsequent encounter Yes  . Closed nondisplaced fracture of base of fifth metacarpal bone of left hand with routine healing, subsequent encounter   . Closed nondisplaced fracture of base of fourth metacarpal bone of left hand with routine healing, subsequent encounter   . Open nondisplaced fracture of proximal phalanx of left middle finger with routine healing, subsequent encounter    Return in two weeks.  X-rays then out of the splint.  X-rays were done today, reported separately.  Call if any problem.  Electronically Signed Darreld McleanWayne Ellary Casamento, MD 11/7/20173:13 PM

## 2016-06-19 ENCOUNTER — Ambulatory Visit (INDEPENDENT_AMBULATORY_CARE_PROVIDER_SITE_OTHER): Payer: Commercial Managed Care - HMO | Admitting: Orthopaedic Surgery

## 2016-06-19 ENCOUNTER — Ambulatory Visit (INDEPENDENT_AMBULATORY_CARE_PROVIDER_SITE_OTHER): Payer: Commercial Managed Care - HMO

## 2016-06-19 DIAGNOSIS — M25531 Pain in right wrist: Secondary | ICD-10-CM | POA: Diagnosis not present

## 2016-06-19 DIAGNOSIS — S6292XD Unspecified fracture of left wrist and hand, subsequent encounter for fracture with routine healing: Secondary | ICD-10-CM | POA: Diagnosis not present

## 2016-06-19 DIAGNOSIS — S62347D Nondisplaced fracture of base of fifth metacarpal bone. left hand, subsequent encounter for fracture with routine healing: Secondary | ICD-10-CM

## 2016-06-19 DIAGNOSIS — S62345D Nondisplaced fracture of base of fourth metacarpal bone, left hand, subsequent encounter for fracture with routine healing: Secondary | ICD-10-CM

## 2016-06-19 NOTE — Progress Notes (Signed)
CC:  I hurt my right wrist too  She fell and hurt her right wrist and has some swelling of the distal ulna.  ROM is good.  X-rays were done of the right wrist and are negative for acute fracture.  X-rays were done of the left hand, reported separately.  She has healing fractures of the left fourth and fifth metacarpals.  She is placed in a cock-up splint.  Precautions given.  Encounter Diagnoses  Name Primary?  . Closed fracture of left hand with routine healing, subsequent encounter Yes  . Wrist pain, acute, right   . Closed nondisplaced fracture of base of fifth metacarpal bone of left hand with routine healing, subsequent encounter   . Closed nondisplaced fracture of base of fourth metacarpal bone of left hand with routine healing, subsequent encounter    Return in two weeks.  X-rays of the left hand out of the splint.  Call if any problem.  Electronically Signed Darreld McleanWayne Lenee Franze, MD 11/21/201710:33 AM

## 2016-07-03 ENCOUNTER — Ambulatory Visit (INDEPENDENT_AMBULATORY_CARE_PROVIDER_SITE_OTHER): Payer: Commercial Managed Care - HMO

## 2016-07-03 ENCOUNTER — Ambulatory Visit (INDEPENDENT_AMBULATORY_CARE_PROVIDER_SITE_OTHER): Payer: Self-pay | Admitting: Orthopaedic Surgery

## 2016-07-03 VITALS — BP 122/77 | HR 72 | Temp 97.3°F | Ht 62.0 in | Wt 102.0 lb

## 2016-07-03 DIAGNOSIS — S6292XD Unspecified fracture of left wrist and hand, subsequent encounter for fracture with routine healing: Secondary | ICD-10-CM

## 2016-07-03 NOTE — Progress Notes (Signed)
CC:  My hand is better  She has less pain in the left hand.  She is using her cock-up splint.  She has no new trauma.  She has full ROM of the fingers of the left hand.  NV intact.  Encounter Diagnoses  Name Primary?  . Closed fracture of left hand with routine healing Yes  . Closed fracture of left hand with routine healing, subsequent encounter    Continue the splint for another two weeks then come out as needed.  Return in one month.  X-rays on return.  Call if any problem  Electronically Signed Darreld McleanWayne Ariyonna Twichell, MD 12/5/201711:03 AM

## 2016-07-17 NOTE — Telephone Encounter (Signed)
error 

## 2016-07-27 DIAGNOSIS — R259 Unspecified abnormal involuntary movements: Secondary | ICD-10-CM | POA: Diagnosis not present

## 2016-07-27 DIAGNOSIS — I10 Essential (primary) hypertension: Secondary | ICD-10-CM | POA: Diagnosis not present

## 2016-07-27 DIAGNOSIS — R296 Repeated falls: Secondary | ICD-10-CM | POA: Diagnosis not present

## 2016-07-27 DIAGNOSIS — E785 Hyperlipidemia, unspecified: Secondary | ICD-10-CM | POA: Diagnosis not present

## 2016-07-27 DIAGNOSIS — G20 Parkinson's disease: Secondary | ICD-10-CM | POA: Diagnosis not present

## 2016-07-27 DIAGNOSIS — Z881 Allergy status to other antibiotic agents status: Secondary | ICD-10-CM | POA: Diagnosis not present

## 2016-07-27 DIAGNOSIS — Z7982 Long term (current) use of aspirin: Secondary | ICD-10-CM | POA: Diagnosis not present

## 2016-07-27 DIAGNOSIS — Z880 Allergy status to penicillin: Secondary | ICD-10-CM | POA: Diagnosis not present

## 2016-07-27 DIAGNOSIS — Z79899 Other long term (current) drug therapy: Secondary | ICD-10-CM | POA: Diagnosis not present

## 2016-07-31 ENCOUNTER — Ambulatory Visit: Payer: Self-pay | Admitting: Orthopaedic Surgery

## 2016-08-02 ENCOUNTER — Ambulatory Visit: Payer: Self-pay | Admitting: Orthopaedic Surgery

## 2016-08-02 ENCOUNTER — Telehealth: Payer: Self-pay | Admitting: Orthopaedic Surgery

## 2016-08-02 NOTE — Telephone Encounter (Signed)
Patient called states hand is fine and requests to cancel 08/02/16 appointment, which she had re-scheduled from 07/31/16.  States that her hand is much better and she feels she does not need to come in for the appointment.

## 2016-08-03 DIAGNOSIS — H5200 Hypermetropia, unspecified eye: Secondary | ICD-10-CM | POA: Diagnosis not present

## 2016-08-03 DIAGNOSIS — I10 Essential (primary) hypertension: Secondary | ICD-10-CM | POA: Diagnosis not present

## 2016-08-03 DIAGNOSIS — H2513 Age-related nuclear cataract, bilateral: Secondary | ICD-10-CM | POA: Diagnosis not present

## 2016-08-09 DIAGNOSIS — I10 Essential (primary) hypertension: Secondary | ICD-10-CM | POA: Diagnosis not present

## 2016-08-09 DIAGNOSIS — G20 Parkinson's disease: Secondary | ICD-10-CM | POA: Diagnosis not present

## 2016-08-09 DIAGNOSIS — G4700 Insomnia, unspecified: Secondary | ICD-10-CM | POA: Diagnosis not present

## 2016-08-09 DIAGNOSIS — E6609 Other obesity due to excess calories: Secondary | ICD-10-CM | POA: Diagnosis not present

## 2016-08-09 DIAGNOSIS — Z6834 Body mass index (BMI) 34.0-34.9, adult: Secondary | ICD-10-CM | POA: Diagnosis not present

## 2016-08-09 DIAGNOSIS — F419 Anxiety disorder, unspecified: Secondary | ICD-10-CM | POA: Diagnosis not present

## 2016-08-09 DIAGNOSIS — E782 Mixed hyperlipidemia: Secondary | ICD-10-CM | POA: Diagnosis not present

## 2016-08-09 DIAGNOSIS — F330 Major depressive disorder, recurrent, mild: Secondary | ICD-10-CM | POA: Diagnosis not present

## 2016-08-10 DIAGNOSIS — Z1211 Encounter for screening for malignant neoplasm of colon: Secondary | ICD-10-CM | POA: Diagnosis not present

## 2016-09-03 DIAGNOSIS — G214 Vascular parkinsonism: Secondary | ICD-10-CM | POA: Diagnosis not present

## 2016-09-19 ENCOUNTER — Emergency Department (HOSPITAL_COMMUNITY)
Admission: EM | Admit: 2016-09-19 | Discharge: 2016-09-19 | Disposition: A | Discharge status: Home / Self Care | Payer: Medicare HMO | Attending: Emergency Medicine | Admitting: Emergency Medicine

## 2016-09-19 ENCOUNTER — Encounter (HOSPITAL_COMMUNITY): Payer: Self-pay | Admitting: Cardiology

## 2016-09-19 ENCOUNTER — Emergency Department (HOSPITAL_COMMUNITY): Payer: Medicare HMO

## 2016-09-19 DIAGNOSIS — S20212A Contusion of left front wall of thorax, initial encounter: Secondary | ICD-10-CM | POA: Diagnosis not present

## 2016-09-19 DIAGNOSIS — I10 Essential (primary) hypertension: Secondary | ICD-10-CM | POA: Insufficient documentation

## 2016-09-19 DIAGNOSIS — Y929 Unspecified place or not applicable: Secondary | ICD-10-CM | POA: Insufficient documentation

## 2016-09-19 DIAGNOSIS — Y999 Unspecified external cause status: Secondary | ICD-10-CM | POA: Diagnosis not present

## 2016-09-19 DIAGNOSIS — S01112A Laceration without foreign body of left eyelid and periocular area, initial encounter: Secondary | ICD-10-CM | POA: Insufficient documentation

## 2016-09-19 DIAGNOSIS — Y9389 Activity, other specified: Secondary | ICD-10-CM | POA: Diagnosis not present

## 2016-09-19 DIAGNOSIS — S0181XA Laceration without foreign body of other part of head, initial encounter: Secondary | ICD-10-CM | POA: Diagnosis not present

## 2016-09-19 DIAGNOSIS — S299XXA Unspecified injury of thorax, initial encounter: Secondary | ICD-10-CM | POA: Diagnosis not present

## 2016-09-19 DIAGNOSIS — G20 Parkinson's disease: Secondary | ICD-10-CM | POA: Diagnosis not present

## 2016-09-19 DIAGNOSIS — W1800XA Striking against unspecified object with subsequent fall, initial encounter: Secondary | ICD-10-CM | POA: Insufficient documentation

## 2016-09-19 DIAGNOSIS — Z79899 Other long term (current) drug therapy: Secondary | ICD-10-CM | POA: Diagnosis not present

## 2016-09-19 DIAGNOSIS — E039 Hypothyroidism, unspecified: Secondary | ICD-10-CM | POA: Diagnosis not present

## 2016-09-19 DIAGNOSIS — S0990XA Unspecified injury of head, initial encounter: Secondary | ICD-10-CM | POA: Diagnosis not present

## 2016-09-19 DIAGNOSIS — R0781 Pleurodynia: Secondary | ICD-10-CM | POA: Diagnosis not present

## 2016-09-19 NOTE — ED Triage Notes (Signed)
Lost balance and fell forward , hitting cement.  Laceration above left eye and abrasions to chin.

## 2016-09-19 NOTE — ED Provider Notes (Signed)
[facility] DEPT Provider Note   CSN: [id number] Arrival date & time: 09/19/16  1343     History   Chief Complaint Chief Complaint  Patient presents with  . Fall    HPI Wendy Reynolds is a 76 y.o. female.  The history is provided by the patient. No language interpreter was used.  Fall  This is a new problem. The current episode started 1 to 2 hours ago. The problem has not changed since onset.Nothing aggravates the symptoms. Nothing relieves the symptoms. She has tried nothing for the symptoms. The treatment provided no relief.  Pt fell trying to put a wheelchair in her car.  Pt hit her face, chin and her ribs.   Past Medical History:  Diagnosis Date  . Alcohol use   . Chest pain 03/04/2012   MI ruled out. Likely anxiety related.  . Depression   . Diastolic dysfunction 03/05/2012   Grade 1. EF 60%  . Generalized anxiety disorder 03/04/2012  . Hyperlipidemia 03/05/2012  . Hypertension   . Hypothyroid   . Panic   . Parkinson disease (HCC)   . Parkinson's disease [facility](HCC)     Patient Active Problem List   Diagnosis Date Noted  . UTI (lower urinary tract infection) 09/15/2014  . Acute kidney injury (HCC) 09/15/2014  . Acute encephalopathy 09/15/2014  . Orthostatic hypotension 09/15/2014  . Hypoglycemia 09/15/2014  . Frequent falls   . Renal insufficiency 09/14/2014  . Parkinson's disease (tremor, stiffness, slow motion, unstable posture) (HCC) 05/15/2013  . 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 Diastolic dysfunction 03/05/2012  . Hyperlipidemia 03/05/2012  . Chest pain 03/04/2012  . Generalized anxiety disorder 03/04/2012  . HTN (hypertension) 03/04/2012  . Depression 03/04/2012    Past Surgical History:  Procedure Laterality Date  . DILATION AND CURETTAGE OF UTERUS      OB History    Gravida Para Term Preterm AB Living   2         2   SAB TAB Ectopic Multiple Live Births                   Home Medications    Prior  to Admission medications   Medication Sig Start Date End Date Taking? Authorizing Provider  carbidopa-levodopa (SINEMET IR) 25-100 MG tablet Take 2 tablets by mouth 3 (three) times daily.     Historical Provider, MD  cholecalciferol (VITAMIN D) 1000 units tablet Take 1,000 Units by mouth daily.    Historical Provider, MD  clonazePAM (KLONOPIN) 2 MG tablet Take 3 mg by mouth at bedtime as needed for anxiety.     Historical Provider, MD  doxycycline (VIBRAMYCIN) 100 MG capsule Take 1 capsule (100 mg total) by mouth 2 (two) times daily. 04/06/16   Vickki HearingStanley E Harrison, MD  DULoxetine (CYMBALTA) 60 MG capsule Take 1 capsule (60 mg total) by mouth daily. 09/06/15   Myrlene Brokereborah R Ross, MD  hydrOXYzine (ATARAX/VISTARIL) 25 MG tablet Take 25 mg by mouth at bedtime as needed (sleep).    Historical Provider, MD  levothyroxine (SYNTHROID, LEVOTHROID) 25 MCG tablet Take 25 mcg by mouth daily. 04/23/14   Historical Provider, MD  lisinopril (PRINIVIL,ZESTRIL) 5 MG tablet Take 5 mg by mouth daily.  03/02/15   Historical Provider, MD  Multiple Vitamin (MULTIVITAMIN WITH MINERALS) TABS tablet Take 1 tablet by mouth daily.    Historical Provider, MD  Omega-3 Fatty Acids (FISH OIL) 1000 MG CPDR Take 1 capsule by mouth  daily.    Historical Provider, MD    Family History Family History  Problem Relation Age of Onset  . Depression Maternal Grandfather   . Depression Cousin   . Alcohol abuse Cousin   . Depression Daughter   . Alcohol abuse Mother   . Alcohol abuse Father   . Alcohol abuse Maternal Aunt     Social History Social History  Substance Use Topics  . Smoking status: Never Smoker  . Smokeless tobacco: Never Used  . Alcohol use Yes     Comment: 2 glasses a night     Allergies   Erythromycin and Penicillins   Review of Systems Review of Systems  All other systems reviewed and are negative.    Physical Exam Updated Vital Signs BP 108/73 (BP Location: Left Arm)   Pulse 67   Temp 98.2 F (36.8 C)  (Oral)   Resp 20   Ht 5\' 2"  (1.575 m)   Wt 47.2 kg   SpO2 100%   BMI 19.02 kg/m   Physical Exam  Constitutional: She appears well-developed and well-nourished. No distress.  HENT:  Head: Normocephalic.  Laceration left eyelid 2 cm. Abrasion chin    Eyes: Conjunctivae are normal.  Neck: Neck supple.  Cardiovascular: Normal rate and regular rhythm.   No murmur heard. Pulmonary/Chest: Effort normal and breath sounds normal. No respiratory distress. She exhibits tenderness.  Tender left ribs.   Abdominal: Soft. There is no tenderness.  Musculoskeletal: She exhibits no edema.  Neurological: She is alert.  Skin: Skin is warm and dry.  Psychiatric: She has a normal mood and affect.  Nursing note and vitals reviewed.    ED Treatments / Results  Labs (all labs ordered are listed, but only abnormal results are displayed) Labs Reviewed - No data to display  EKG  EKG Interpretation None       Radiology Dg Ribs Unilateral W/chest Left  Result Date: 09/19/2016 CLINICAL DATA:  76 y/o  F; fall with pain. EXAM: LEFT RIBS AND CHEST - 3+ VIEW COMPARISON:  None. FINDINGS: No fracture or other bone lesions are seen involving the ribs. There is no evidence of pneumothorax or pleural effusion. Both lungs are clear. Heart size and mediastinal contours are within normal limits. IMPRESSION: Negative. Electronically Signed   By: Mitzi Hansen M.D.   On: 09/19/2016 16:13   Ct Head Wo Contrast  Result Date: 09/19/2016 CLINICAL DATA:  Fall. EXAM: CT HEAD WITHOUT CONTRAST TECHNIQUE: Contiguous axial images were obtained from the base of the skull through the vertex without intravenous contrast. COMPARISON:  CT head 04/05/2016 FINDINGS: Brain: Mild atrophy, consistent with age and unchanged from the prior study. Negative for hydrocephalus. Negative for acute infarct, hemorrhage, or mass lesion. Vascular: No hyperdense vessel or unexpected calcification. Skull: Negative for skull fracture.  Laceration and contusion above the left eye. Sinuses/Orbits: Negative Other: None IMPRESSION: No acute intracranial abnormality. Electronically Signed   By: Marlan Palau M.D.   On: 09/19/2016 16:49    Procedures .Marland KitchenLaceration Repair Date/Time: 09/19/2016 5:05 PM Performed by: Elson Areas Authorized by: Elson Areas   Consent:    Consent obtained:  Verbal   Alternatives discussed:  No treatment Laceration details:    Location:  Face   Length (cm):  2 Repair type:    Repair type:  Simple Pre-procedure details:    Preparation:  Patient was prepped and draped in usual sterile fashion Treatment:    Area cleansed with:  Betadine   Irrigation  solution:  Sterile saline   Visualized foreign bodies/material removed: no   Skin repair:    Repair method:  Tissue adhesive Approximation:    Vermilion border: well-aligned     (including critical care time)  Medications Ordered in ED Medications - No data to display   Initial Impression / Assessment and Plan / ED Course  I have reviewed the triage vital signs and the nursing notes.  Pertinent labs & imaging results that were available during my care of the patient were reviewed by me and considered in my medical decision making (see chart for details).       Final Clinical Impressions(s) / ED Diagnoses   Final diagnoses:  Contusion of left front wall of thorax, initial encounter  Facial laceration, initial encounter   Pt reports she has ibuprofen at home that she will take for pain.  New Prescriptions New Prescriptions   No medications on file  An After Visit Summary was printed and given to the patient.    Lonia Skinner [place], PA-C 09/19/16 1706    Samuel Jester, DO 09/21/16 2012

## 2016-09-19 NOTE — ED Notes (Signed)
Pt made aware to return if symptoms worsen or if any life threatening symptoms occur.   

## 2016-09-30 ENCOUNTER — Emergency Department (HOSPITAL_COMMUNITY)
Admission: EM | Admit: 2016-09-30 | Discharge: 2016-09-30 | Disposition: A | Discharge status: Home / Self Care | Payer: Medicare HMO | Attending: Emergency Medicine | Admitting: Emergency Medicine

## 2016-09-30 ENCOUNTER — Emergency Department (HOSPITAL_COMMUNITY): Payer: Medicare HMO

## 2016-09-30 ENCOUNTER — Encounter (HOSPITAL_COMMUNITY): Payer: Self-pay | Admitting: Emergency Medicine

## 2016-09-30 DIAGNOSIS — G20 Parkinson's disease: Secondary | ICD-10-CM | POA: Insufficient documentation

## 2016-09-30 DIAGNOSIS — Z79899 Other long term (current) drug therapy: Secondary | ICD-10-CM | POA: Diagnosis not present

## 2016-09-30 DIAGNOSIS — Y999 Unspecified external cause status: Secondary | ICD-10-CM | POA: Diagnosis not present

## 2016-09-30 DIAGNOSIS — Y939 Activity, unspecified: Secondary | ICD-10-CM | POA: Diagnosis not present

## 2016-09-30 DIAGNOSIS — R220 Localized swelling, mass and lump, head: Secondary | ICD-10-CM | POA: Diagnosis not present

## 2016-09-30 DIAGNOSIS — E039 Hypothyroidism, unspecified: Secondary | ICD-10-CM | POA: Insufficient documentation

## 2016-09-30 DIAGNOSIS — W1839XA Other fall on same level, initial encounter: Secondary | ICD-10-CM | POA: Diagnosis not present

## 2016-09-30 DIAGNOSIS — S0990XA Unspecified injury of head, initial encounter: Secondary | ICD-10-CM | POA: Diagnosis not present

## 2016-09-30 DIAGNOSIS — I10 Essential (primary) hypertension: Secondary | ICD-10-CM | POA: Insufficient documentation

## 2016-09-30 DIAGNOSIS — S01511A Laceration without foreign body of lip, initial encounter: Secondary | ICD-10-CM | POA: Insufficient documentation

## 2016-09-30 DIAGNOSIS — W19XXXA Unspecified fall, initial encounter: Secondary | ICD-10-CM

## 2016-09-30 DIAGNOSIS — S199XXA Unspecified injury of neck, initial encounter: Secondary | ICD-10-CM | POA: Diagnosis not present

## 2016-09-30 DIAGNOSIS — Y92009 Unspecified place in unspecified non-institutional (private) residence as the place of occurrence of the external cause: Secondary | ICD-10-CM | POA: Insufficient documentation

## 2016-09-30 HISTORY — DX: Unspecified fall, initial encounter: W19.XXXA

## 2016-09-30 HISTORY — DX: Repeated falls: R29.6

## 2016-09-30 MED ORDER — LIDOCAINE-EPINEPHRINE (PF) 2 %-1:200000 IJ SOLN
INTRAMUSCULAR | Status: AC
  Filled 2016-09-30: qty 20

## 2016-09-30 MED ORDER — CLINDAMYCIN HCL 150 MG PO CAPS: 150.0000 mg | ORAL_CAPSULE | Freq: Three times a day (TID) | ORAL | 0 refills | Status: DC

## 2016-09-30 MED ORDER — CLINDAMYCIN HCL 150 MG PO CAPS
150.0000 mg | ORAL_CAPSULE | Freq: Once | ORAL | Status: AC
  Administered 2016-09-30: 150 mg via ORAL
  Filled 2016-09-30: qty 1

## 2016-09-30 MED ORDER — LIDOCAINE-EPINEPHRINE 2 %-1:100000 IJ SOLN
20.0000 mL | Freq: Once | INTRAMUSCULAR | Status: DC
  Filled 2016-09-30: qty 20

## 2016-09-30 NOTE — ED Provider Notes (Signed)
[facility] DEPT Provider Note   CSN: [id number] Arrival date & time: 09/30/16  0039     History   Chief Complaint Chief Complaint  Patient presents with  . Laceration    HPI Wendy Reynolds is a 76 y.o. female.  She fell forward at home and suffered a laceration to her upper lip. She denies loss of consciousness. She denies other injury. She has Parkinson's disease which causes problems with her balance and makes her prone to falling.   The history is provided by the patient.  Laceration      Past Medical History:  Diagnosis Date  . Alcohol use   . Chest pain 03/04/2012   MI ruled out. Likely anxiety related.  . Depression   . Diastolic dysfunction 03/05/2012   Grade 1. EF 60%  . Falls   . Generalized anxiety disorder 03/04/2012  . Hyperlipidemia 03/05/2012  . Hypertension   . Hypothyroid   . Panic   . Parkinson disease (HCC)   . Parkinson's disease [facility])     Patient Active Problem List   Diagnosis Date Noted  . UTI (lower urinary tract infection) 09/15/2014  . Acute kidney injury (HCC) 09/15/2014  . Acute encephalopathy 09/15/2014  . Orthostatic hypotension 09/15/2014  . Hypoglycemia 09/15/2014  . Frequent falls   . Renal insufficiency 09/14/2014  . Parkinson's disease (tremor, stiffness, slow motion, unstable posture) (HCC) 05/15/2013  . 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 Diastolic dysfunction 03/05/2012  . Hyperlipidemia 03/05/2012  . Chest pain 03/04/2012  . Generalized anxiety disorder 03/04/2012  . HTN (hypertension) 03/04/2012  . Depression 03/04/2012    Past Surgical History:  Procedure Laterality Date  . DILATION AND CURETTAGE OF UTERUS      OB History    Gravida Para Term Preterm AB Living   2         2   SAB TAB Ectopic Multiple Live Births                   Home Medications    Prior to Admission medications   Medication Sig Start Date End Date Taking? Authorizing Provider    carbidopa-levodopa (SINEMET IR) 25-100 MG tablet Take 2 tablets by mouth 3 (three) times daily.    Yes Historical Provider, MD  cholecalciferol (VITAMIN D) 1000 units tablet Take 1,000 Units by mouth daily.   Yes Historical Provider, MD  clonazePAM (KLONOPIN) 2 MG tablet Take 3 mg by mouth at bedtime as needed for anxiety.    Yes Historical Provider, MD  DULoxetine (CYMBALTA) 60 MG capsule Take 1 capsule (60 mg total) by mouth daily. 09/06/15  Yes Myrlene Broker, MD  hydrOXYzine (ATARAX/VISTARIL) 25 MG tablet Take 25 mg by mouth at bedtime as needed (sleep).   Yes Historical Provider, MD  levothyroxine (SYNTHROID, LEVOTHROID) 25 MCG tablet Take 25 mcg by mouth daily. 04/23/14  Yes Historical Provider, MD  lisinopril (PRINIVIL,ZESTRIL) 5 MG tablet Take 5 mg by mouth daily.  03/02/15  Yes Historical Provider, MD  Multiple Vitamin (MULTIVITAMIN WITH MINERALS) TABS tablet Take 1 tablet by mouth daily.   Yes Historical Provider, MD  Omega-3 Fatty Acids (FISH OIL) 1000 MG CPDR Take 1 capsule by mouth daily.   Yes Historical Provider, MD  doxycycline (VIBRAMYCIN) 100 MG capsule Take 1 capsule (100 mg total) by mouth 2 (two) times daily. 04/06/16   Vickki Hearing, MD    Family History Family History  Problem Relation Age  of Onset  . Depression Maternal Grandfather   . Depression Cousin   . Alcohol abuse Cousin   . Depression Daughter   . Alcohol abuse Mother   . Alcohol abuse Father   . Alcohol abuse Maternal Aunt     Social History Social History  Substance Use Topics  . Smoking status: Never Smoker  . Smokeless tobacco: Never Used  . Alcohol use Yes     Comment: 2 glasses a night     Allergies   Erythromycin and Penicillins   Review of Systems Review of Systems  All other systems reviewed and are negative.    Physical Exam Updated Vital Signs BP 114/64 (BP Location: Left Arm)   Pulse 72   Temp 97.9 F (36.6 C) (Oral)   Resp 16   Ht 5\' 2"  (1.575 m)   Wt 104 lb (47.2 kg)    SpO2 90%   BMI 19.02 kg/m   Physical Exam  Nursing note and vitals reviewed.  76 year old female, resting comfortably and in no acute distress. Vital signs are normal. Oxygen saturation is 90%, which is hypoxic. Head is normocephalic. There is moderate swelling of the upper lip with laceration on the mucosal surface. Laceration does not cross the vermilion border. PERRLA, EOMI. Oropharynx is clear. Neck is nontender without adenopathy or JVD. Back is nontender and there is no CVA tenderness. Lungs are clear without rales, wheezes, or rhonchi. Chest is nontender. Heart has regular rate and rhythm without murmur. Abdomen is soft, flat, nontender without masses or hepatosplenomegaly and peristalsis is normoactive. Extremities have no cyanosis or edema, full range of motion is present. Skin is warm and dry without rash. Neurologic: Mental status is normal, cranial nerves are intact, there are no motor or sensory deficits. Moderate to severe resting tremor is present.  ED Treatments / Results   Radiology Ct Head Wo Contrast  Result Date: 09/30/2016 CLINICAL DATA:  Status post fall, with laceration at the upper lip. Left-sided facial and forehead bruising. Concern for cervical spine injury. Initial encounter. EXAM: CT HEAD WITHOUT CONTRAST CT MAXILLOFACIAL WITHOUT CONTRAST CT CERVICAL SPINE WITHOUT CONTRAST TECHNIQUE: Multidetector CT imaging of the head, cervical spine, and maxillofacial structures were performed using the standard protocol without intravenous contrast. Multiplanar CT image reconstructions of the cervical spine and maxillofacial structures were also generated. COMPARISON:  CT of the head performed 09/19/2016 FINDINGS: CT HEAD FINDINGS Brain: No evidence of acute infarction, hemorrhage, hydrocephalus, extra-axial collection or mass lesion/mass effect. Prominence of the ventricles and sulci reflects mild cortical volume loss. Mild cerebellar atrophy is noted. Scattered  periventricular white matter change likely reflects small vessel ischemic microangiopathy. The brainstem and fourth ventricle are within normal limits. The basal ganglia are unremarkable in appearance. The cerebral hemispheres demonstrate grossly normal gray-white differentiation. No mass effect or midline shift is seen. Vascular: No hyperdense vessel or unexpected calcification. Skull: There is no evidence of fracture; visualized osseous structures are unremarkable in appearance. Other: No significant soft tissue abnormalities are seen. CT MAXILLOFACIAL FINDINGS Osseous: There is no evidence of fracture or dislocation. The maxilla and mandible appear intact. The nasal bone is unremarkable in appearance. The visualized dentition demonstrates no acute abnormality. There is chronic degenerative flattening at the right mandibular condylar head, reflecting temporomandibular joint disease. Orbits: The orbits are intact bilaterally. Sinuses: The visualized paranasal sinuses and mastoid air cells are well-aerated. Soft tissues: Mild soft tissue swelling is noted overlying the maxilla bilaterally. The parapharyngeal fat planes are preserved. The nasopharynx,  oropharynx and hypopharynx are unremarkable in appearance. The visualized portions of the valleculae and piriform sinuses are grossly unremarkable. The parotid and submandibular glands are within normal limits. No cervical lymphadenopathy is seen. CT CERVICAL SPINE FINDINGS Alignment: Normal. Skull base and vertebrae: No acute fracture. No primary bone lesion or focal pathologic process. Soft tissues and spinal canal: No prevertebral fluid or swelling. No visible canal hematoma. Disc levels: Intervertebral disc spaces are preserved. Mild facet disease is noted along the cervical spine. Upper chest: The thyroid gland is unremarkable in appearance. The visualized lung apices are clear. Other: No additional soft tissue abnormalities are seen. IMPRESSION: 1. No evidence of  traumatic intracranial injury or fracture. 2. No evidence of fracture or subluxation along the cervical spine. 3. No evidence of fracture or dislocation with regard to the maxillofacial structures. 4. Mild soft tissue swelling overlying the maxilla bilaterally. 5. Mild cortical volume loss and scattered small vessel ischemic microangiopathy. 6. Chronic degenerative flattening at the right mandibular condylar head, reflecting right-sided temporomandibular joint disease. 7. Minimal degenerative change along the cervical spine. Electronically Signed   By: Roanna RaiderJeffery  Chang M.D.   On: 09/30/2016 02:01   Ct Cervical Spine Wo Contrast  Result Date: 09/30/2016 CLINICAL DATA:  Status post fall, with laceration at the upper lip. Left-sided facial and forehead bruising. Concern for cervical spine injury. Initial encounter. EXAM: CT HEAD WITHOUT CONTRAST CT MAXILLOFACIAL WITHOUT CONTRAST CT CERVICAL SPINE WITHOUT CONTRAST TECHNIQUE: Multidetector CT imaging of the head, cervical spine, and maxillofacial structures were performed using the standard protocol without intravenous contrast. Multiplanar CT image reconstructions of the cervical spine and maxillofacial structures were also generated. COMPARISON:  CT of the head performed 09/19/2016 FINDINGS: CT HEAD FINDINGS Brain: No evidence of acute infarction, hemorrhage, hydrocephalus, extra-axial collection or mass lesion/mass effect. Prominence of the ventricles and sulci reflects mild cortical volume loss. Mild cerebellar atrophy is noted. Scattered periventricular white matter change likely reflects small vessel ischemic microangiopathy. The brainstem and fourth ventricle are within normal limits. The basal ganglia are unremarkable in appearance. The cerebral hemispheres demonstrate grossly normal gray-white differentiation. No mass effect or midline shift is seen. Vascular: No hyperdense vessel or unexpected calcification. Skull: There is no evidence of fracture; visualized  osseous structures are unremarkable in appearance. Other: No significant soft tissue abnormalities are seen. CT MAXILLOFACIAL FINDINGS Osseous: There is no evidence of fracture or dislocation. The maxilla and mandible appear intact. The nasal bone is unremarkable in appearance. The visualized dentition demonstrates no acute abnormality. There is chronic degenerative flattening at the right mandibular condylar head, reflecting temporomandibular joint disease. Orbits: The orbits are intact bilaterally. Sinuses: The visualized paranasal sinuses and mastoid air cells are well-aerated. Soft tissues: Mild soft tissue swelling is noted overlying the maxilla bilaterally. The parapharyngeal fat planes are preserved. The nasopharynx, oropharynx and hypopharynx are unremarkable in appearance. The visualized portions of the valleculae and piriform sinuses are grossly unremarkable. The parotid and submandibular glands are within normal limits. No cervical lymphadenopathy is seen. CT CERVICAL SPINE FINDINGS Alignment: Normal. Skull base and vertebrae: No acute fracture. No primary bone lesion or focal pathologic process. Soft tissues and spinal canal: No prevertebral fluid or swelling. No visible canal hematoma. Disc levels: Intervertebral disc spaces are preserved. Mild facet disease is noted along the cervical spine. Upper chest: The thyroid gland is unremarkable in appearance. The visualized lung apices are clear. Other: No additional soft tissue abnormalities are seen. IMPRESSION: 1. No evidence of traumatic intracranial injury or fracture. 2.  No evidence of fracture or subluxation along the cervical spine. 3. No evidence of fracture or dislocation with regard to the maxillofacial structures. 4. Mild soft tissue swelling overlying the maxilla bilaterally. 5. Mild cortical volume loss and scattered small vessel ischemic microangiopathy. 6. Chronic degenerative flattening at the right mandibular condylar head, reflecting  right-sided temporomandibular joint disease. 7. Minimal degenerative change along the cervical spine. Electronically Signed   By: Roanna Raider M.D.   On: 09/30/2016 02:01   Ct Maxillofacial Wo Contrast  Result Date: 09/30/2016 CLINICAL DATA:  Status post fall, with laceration at the upper lip. Left-sided facial and forehead bruising. Concern for cervical spine injury. Initial encounter. EXAM: CT HEAD WITHOUT CONTRAST CT MAXILLOFACIAL WITHOUT CONTRAST CT CERVICAL SPINE WITHOUT CONTRAST TECHNIQUE: Multidetector CT imaging of the head, cervical spine, and maxillofacial structures were performed using the standard protocol without intravenous contrast. Multiplanar CT image reconstructions of the cervical spine and maxillofacial structures were also generated. COMPARISON:  CT of the head performed 09/19/2016 FINDINGS: CT HEAD FINDINGS Brain: No evidence of acute infarction, hemorrhage, hydrocephalus, extra-axial collection or mass lesion/mass effect. Prominence of the ventricles and sulci reflects mild cortical volume loss. Mild cerebellar atrophy is noted. Scattered periventricular white matter change likely reflects small vessel ischemic microangiopathy. The brainstem and fourth ventricle are within normal limits. The basal ganglia are unremarkable in appearance. The cerebral hemispheres demonstrate grossly normal gray-white differentiation. No mass effect or midline shift is seen. Vascular: No hyperdense vessel or unexpected calcification. Skull: There is no evidence of fracture; visualized osseous structures are unremarkable in appearance. Other: No significant soft tissue abnormalities are seen. CT MAXILLOFACIAL FINDINGS Osseous: There is no evidence of fracture or dislocation. The maxilla and mandible appear intact. The nasal bone is unremarkable in appearance. The visualized dentition demonstrates no acute abnormality. There is chronic degenerative flattening at the right mandibular condylar head, reflecting  temporomandibular joint disease. Orbits: The orbits are intact bilaterally. Sinuses: The visualized paranasal sinuses and mastoid air cells are well-aerated. Soft tissues: Mild soft tissue swelling is noted overlying the maxilla bilaterally. The parapharyngeal fat planes are preserved. The nasopharynx, oropharynx and hypopharynx are unremarkable in appearance. The visualized portions of the valleculae and piriform sinuses are grossly unremarkable. The parotid and submandibular glands are within normal limits. No cervical lymphadenopathy is seen. CT CERVICAL SPINE FINDINGS Alignment: Normal. Skull base and vertebrae: No acute fracture. No primary bone lesion or focal pathologic process. Soft tissues and spinal canal: No prevertebral fluid or swelling. No visible canal hematoma. Disc levels: Intervertebral disc spaces are preserved. Mild facet disease is noted along the cervical spine. Upper chest: The thyroid gland is unremarkable in appearance. The visualized lung apices are clear. Other: No additional soft tissue abnormalities are seen. IMPRESSION: 1. No evidence of traumatic intracranial injury or fracture. 2. No evidence of fracture or subluxation along the cervical spine. 3. No evidence of fracture or dislocation with regard to the maxillofacial structures. 4. Mild soft tissue swelling overlying the maxilla bilaterally. 5. Mild cortical volume loss and scattered small vessel ischemic microangiopathy. 6. Chronic degenerative flattening at the right mandibular condylar head, reflecting right-sided temporomandibular joint disease. 7. Minimal degenerative change along the cervical spine. Electronically Signed   By: Roanna Raider M.D.   On: 09/30/2016 02:01    Procedures Procedures (including critical care time). LACERATION REPAIR Performed by: [email or web address] Authorized by: [email or web address] Consent: Verbal consent obtained. Risks and benefits: risks, benefits and alternatives were discussed Consent given by:  patient Patient identity confirmed: provided  demographic data Prepped and Draped in normal sterile fashion Wound explored  Laceration Location: Mucosal surface upper lip  Laceration Length: 5.0 cm  No Foreign Bodies seen or palpated  Anesthesia: local infiltration  Local anesthetic: lidocaine 2% with epinephrine  Anesthetic total: 4 ml  Amount of cleaning: standard  Skin closure: close  Number of sutures: 11 5-0 chromic gut sutures  Technique: Simple interrupted, but with careful alignment of multiple flaps.  Patient tolerance: Patient tolerated the procedure well with no immediate complications.   Medications Ordered in ED Medications  lidocaine-EPINEPHrine (XYLOCAINE W/EPI) 2 %-1:200000 (PF) injection (not administered)  clindamycin (CLEOCIN) capsule 150 mg (not administered)     Initial Impression / Assessment and Plan / ED Course  I have reviewed the triage vital signs and the nursing notes.  Pertinent labs & imaging results that were available during my care of the patient were reviewed by me and considered in my medical decision making (see chart for details).  Fall with facial laceration. Old records reviewed, and she has multiple visits for falls. She is not on anticoagulants. She will be sent for CT of head, axial facial, cervical spine. She will need suture closure of laceration.  Final Clinical Impressions(s) / ED Diagnoses   Final diagnoses:  Fall at home, initial encounter  Laceration of upper lip, complicated, initial encounter    New Prescriptions New Prescriptions   CLINDAMYCIN (CLEOCIN) 150 MG CAPSULE    Take 1 capsule (150 mg total) by mouth 3 (three) times daily.     Dione Booze, MD 09/30/16 [phone]

## 2016-09-30 NOTE — Discharge Instructions (Signed)
Stitches will dissolve. °

## 2016-09-30 NOTE — ED Triage Notes (Signed)
Pt fell on her way to bed tonight sustaining a laceration to her upper lip. Pt has swollen upper lip and bruising to L. Side of face and forehead with old abrasion above L. Eye area.

## 2016-10-09 ENCOUNTER — Encounter (HOSPITAL_COMMUNITY): Payer: Self-pay | Admitting: Emergency Medicine

## 2016-10-09 ENCOUNTER — Emergency Department (HOSPITAL_COMMUNITY)
Admission: EM | Admit: 2016-10-09 | Discharge: 2016-10-09 | Disposition: A | Discharge status: Home / Self Care | Payer: Medicare HMO | Attending: Emergency Medicine | Admitting: Emergency Medicine

## 2016-10-09 DIAGNOSIS — G20 Parkinson's disease: Secondary | ICD-10-CM | POA: Diagnosis not present

## 2016-10-09 DIAGNOSIS — I10 Essential (primary) hypertension: Secondary | ICD-10-CM | POA: Diagnosis not present

## 2016-10-09 DIAGNOSIS — E039 Hypothyroidism, unspecified: Secondary | ICD-10-CM | POA: Diagnosis not present

## 2016-10-09 DIAGNOSIS — Z4802 Encounter for removal of sutures: Secondary | ICD-10-CM

## 2016-10-09 NOTE — ED Notes (Signed)
Pt just came out to nursing station stating, "I pulled it out!". RN went in to assess pt and the dissolvable suture was now gone from the left upper lip. Pt reports she "gave it a good tug and it came on out". No bleeding noted, edges approximated. PA notified.

## 2016-10-09 NOTE — ED Triage Notes (Signed)
Patient had sutures placed in left upper lip on 3/4. Patient told sutures are dissolvable but patient still had one sture that has not come out and hurts to pull. Denies any signs/symptoms  of infection.

## 2016-10-09 NOTE — Discharge Instructions (Signed)
Return if needed.  The wound appears to be healing without signs of infection

## 2016-10-13 NOTE — ED Provider Notes (Signed)
[facility]-EMERGENCY DEPT Provider Note   CSN: [id number] Arrival date & time: 10/09/16  1558     History   Chief Complaint Chief Complaint  Patient presents with  . Suture / Staple Removal    HPI Wendy Reynolds is a 76 y.o. female.  HPI   Wendy Reynolds is a 76 y.o. female who presents to the Emergency Department requesting suture removal.  She was seen here on 09/30/16 and treated with a laceration to the upper lip.  The sutures placed were dissolvable, but she complains of one suture that still persists and she has pain to her lip.  She denies redness, swelling.    Past Medical History:  Diagnosis Date  . Alcohol use   . Chest pain 03/04/2012   MI ruled out. Likely anxiety related.  . Depression   . Diastolic dysfunction 03/05/2012   Grade 1. EF 60%  . Falls   . Generalized anxiety disorder 03/04/2012  . Hyperlipidemia 03/05/2012  . Hypertension   . Hypothyroid   . Panic   . Parkinson disease (HCC)   . Parkinson's disease [facility](HCC)     Patient Active Problem List   Diagnosis Date Noted  . UTI (lower urinary tract infection) 09/15/2014  . Acute kidney injury (HCC) 09/15/2014  . Acute encephalopathy 09/15/2014  . Orthostatic hypotension 09/15/2014  . Hypoglycemia 09/15/2014  . Frequent falls   . Renal insufficiency 09/14/2014  . Parkinson's disease (tremor, stiffness, slow motion, unstable posture) (HCC) 05/15/2013  . 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 Diastolic dysfunction 03/05/2012  . Hyperlipidemia 03/05/2012  . Chest pain 03/04/2012  . Generalized anxiety disorder 03/04/2012  . HTN (hypertension) 03/04/2012  . Depression 03/04/2012    Past Surgical History:  Procedure Laterality Date  . DILATION AND CURETTAGE OF UTERUS      OB History    Gravida Para Term Preterm AB Living   2         2   SAB TAB Ectopic Multiple Live Births                   Home Medications    Prior to Admission medications   Medication Sig  Start Date End Date Taking? Authorizing Provider  carbidopa-levodopa (SINEMET IR) 25-100 MG tablet Take 2 tablets by mouth 3 (three) times daily.     Historical Provider, MD  cholecalciferol (VITAMIN D) 1000 units tablet Take 1,000 Units by mouth daily.    Historical Provider, MD  clindamycin (CLEOCIN) 150 MG capsule Take 1 capsule (150 mg total) by mouth 3 (three) times daily. 09/30/16   Dione Boozeavid Glick, MD  clonazePAM (KLONOPIN) 2 MG tablet Take 3 mg by mouth at bedtime as needed for anxiety.     Historical Provider, MD  DULoxetine (CYMBALTA) 60 MG capsule Take 1 capsule (60 mg total) by mouth daily. 09/06/15   Myrlene Brokereborah R Ross, MD  hydrOXYzine (ATARAX/VISTARIL) 25 MG tablet Take 25 mg by mouth at bedtime as needed (sleep).    Historical Provider, MD  levothyroxine (SYNTHROID, LEVOTHROID) 25 MCG tablet Take 25 mcg by mouth daily. 04/23/14   Historical Provider, MD  lisinopril (PRINIVIL,ZESTRIL) 5 MG tablet Take 5 mg by mouth daily.  03/02/15   Historical Provider, MD  Multiple Vitamin (MULTIVITAMIN WITH MINERALS) TABS tablet Take 1 tablet by mouth daily.    Historical Provider, MD  Omega-3 Fatty Acids (FISH OIL) 1000 MG CPDR Take 1 capsule by mouth daily.    Historical  Provider, MD    Family History Family History  Problem Relation Age of Onset  . Depression Maternal Grandfather   . Depression Cousin   . Alcohol abuse Cousin   . Depression Daughter   . Alcohol abuse Mother   . Alcohol abuse Father   . Alcohol abuse Maternal Aunt     Social History Social History  Substance Use Topics  . Smoking status: Never Smoker  . Smokeless tobacco: Never Used  . Alcohol use Yes     Comment: 2 glasses a night     Allergies   Erythromycin and Penicillins   Review of Systems Review of Systems  Constitutional: Negative for chills and fever.  Musculoskeletal: Negative for arthralgias, back pain and joint swelling.  Skin: Positive for wound.       Laceration lip  Neurological: Negative for dizziness,  weakness and numbness.  Hematological: Does not bruise/bleed easily.  All other systems reviewed and are negative.    Physical Exam Updated Vital Signs BP 112/64 (BP Location: Left Arm)   Pulse 72   Temp 97.9 F (36.6 C) (Temporal)   Resp 20   Ht 5\' 2"  (1.575 m)   Wt 47.2 kg   SpO2 100%   BMI 19.02 kg/m   Physical Exam  Constitutional: She is oriented to person, place, and time. She appears well-developed and well-nourished. No distress.  HENT:  Head: Normocephalic and atraumatic.  Mouth/Throat: Oropharynx is clear and moist.  Well healed laceration of upper lip.  Pt has removed the remaining suture prior to my exam.  No edema, no wound dehiscence   Neck: Normal range of motion.  Cardiovascular: Normal rate, regular rhythm and intact distal pulses.   No murmur heard. Pulmonary/Chest: Effort normal and breath sounds normal. No respiratory distress.  Musculoskeletal: Normal range of motion. She exhibits no edema or tenderness.  Lymphadenopathy:    She has no cervical adenopathy.  Neurological: She is alert and oriented to person, place, and time. She exhibits normal muscle tone. Coordination normal.  Skin: Skin is warm.  Nursing note and vitals reviewed.    ED Treatments / Results  Labs (all labs ordered are listed, but only abnormal results are displayed) Labs Reviewed - No data to display  EKG  EKG Interpretation None       Radiology No results found.  Procedures Procedures (including critical care time)  Medications Ordered in ED Medications - No data to display   Initial Impression / Assessment and Plan / ED Course  I have reviewed the triage vital signs and the nursing notes.  Pertinent labs & imaging results that were available during my care of the patient were reviewed by me and considered in my medical decision making (see chart for details).     Pt removed the remaining suture prior to my exam.  Wound well healed.  Pt reassured.    Final  Clinical Impressions(s) / ED Diagnoses   Final diagnoses:  Visit for suture removal    New Prescriptions Discharge Medication List as of 10/09/2016  4:59 PM       Xee Hollman Rowe Robert 10/13/16 1228    Bethann Berkshire, MD 10/13/16 1506

## 2016-10-15 ENCOUNTER — Encounter (HOSPITAL_COMMUNITY): Payer: Self-pay | Admitting: Emergency Medicine

## 2016-10-15 ENCOUNTER — Emergency Department (HOSPITAL_COMMUNITY): Payer: Medicare HMO

## 2016-10-15 ENCOUNTER — Emergency Department (HOSPITAL_COMMUNITY)
Admission: EM | Admit: 2016-10-15 | Discharge: 2016-10-15 | Disposition: A | Discharge status: Home / Self Care | Payer: Medicare HMO | Attending: Emergency Medicine | Admitting: Emergency Medicine

## 2016-10-15 DIAGNOSIS — M79644 Pain in right finger(s): Secondary | ICD-10-CM | POA: Diagnosis not present

## 2016-10-15 DIAGNOSIS — S0990XA Unspecified injury of head, initial encounter: Secondary | ICD-10-CM | POA: Diagnosis not present

## 2016-10-15 DIAGNOSIS — I10 Essential (primary) hypertension: Secondary | ICD-10-CM | POA: Diagnosis not present

## 2016-10-15 DIAGNOSIS — S00431A Contusion of right ear, initial encounter: Secondary | ICD-10-CM | POA: Insufficient documentation

## 2016-10-15 DIAGNOSIS — M79641 Pain in right hand: Secondary | ICD-10-CM | POA: Diagnosis not present

## 2016-10-15 DIAGNOSIS — Y929 Unspecified place or not applicable: Secondary | ICD-10-CM | POA: Insufficient documentation

## 2016-10-15 DIAGNOSIS — S161XXA Strain of muscle, fascia and tendon at neck level, initial encounter: Secondary | ICD-10-CM | POA: Insufficient documentation

## 2016-10-15 DIAGNOSIS — Z79899 Other long term (current) drug therapy: Secondary | ICD-10-CM | POA: Diagnosis not present

## 2016-10-15 DIAGNOSIS — S6991XA Unspecified injury of right wrist, hand and finger(s), initial encounter: Secondary | ICD-10-CM | POA: Diagnosis not present

## 2016-10-15 DIAGNOSIS — E039 Hypothyroidism, unspecified: Secondary | ICD-10-CM | POA: Insufficient documentation

## 2016-10-15 DIAGNOSIS — Y999 Unspecified external cause status: Secondary | ICD-10-CM | POA: Insufficient documentation

## 2016-10-15 DIAGNOSIS — W19XXXA Unspecified fall, initial encounter: Secondary | ICD-10-CM

## 2016-10-15 DIAGNOSIS — Y9301 Activity, walking, marching and hiking: Secondary | ICD-10-CM | POA: Diagnosis not present

## 2016-10-15 DIAGNOSIS — W1800XA Striking against unspecified object with subsequent fall, initial encounter: Secondary | ICD-10-CM | POA: Diagnosis not present

## 2016-10-15 DIAGNOSIS — G20 Parkinson's disease: Secondary | ICD-10-CM | POA: Diagnosis not present

## 2016-10-15 DIAGNOSIS — M542 Cervicalgia: Secondary | ICD-10-CM | POA: Diagnosis not present

## 2016-10-15 DIAGNOSIS — S0993XA Unspecified injury of face, initial encounter: Secondary | ICD-10-CM | POA: Diagnosis not present

## 2016-10-15 DIAGNOSIS — S199XXA Unspecified injury of neck, initial encounter: Secondary | ICD-10-CM | POA: Diagnosis not present

## 2016-10-15 LAB — CBC WITH DIFFERENTIAL/PLATELET
Basophils Absolute: 0 10*3/uL (ref 0.0–0.1)
Basophils Relative: 0 %
EOS ABS: 0 10*3/uL (ref 0.0–0.7)
EOS PCT: 0 %
HCT: 36.2 % (ref 36.0–46.0)
Hemoglobin: 12.4 g/dL (ref 12.0–15.0)
LYMPHS ABS: 1.5 10*3/uL (ref 0.7–4.0)
Lymphocytes Relative: 19 %
MCH: 29.7 pg (ref 26.0–34.0)
MCHC: 34.3 g/dL (ref 30.0–36.0)
MCV: 86.8 fL (ref 78.0–100.0)
MONO ABS: 0.5 10*3/uL (ref 0.1–1.0)
MONOS PCT: 7 %
Neutro Abs: 5.7 10*3/uL (ref 1.7–7.7)
Neutrophils Relative %: 74 %
PLATELETS: 282 10*3/uL (ref 150–400)
RBC: 4.17 MIL/uL (ref 3.87–5.11)
RDW: 13.5 % (ref 11.5–15.5)
WBC: 7.7 10*3/uL (ref 4.0–10.5)

## 2016-10-15 LAB — BASIC METABOLIC PANEL
ANION GAP: 9 (ref 5–15)
BUN: 20 mg/dL (ref 6–20)
CHLORIDE: 98 mmol/L — AB (ref 101–111)
CO2: 25 mmol/L (ref 22–32)
CREATININE: 1.04 mg/dL — AB (ref 0.44–1.00)
Calcium: 9.1 mg/dL (ref 8.9–10.3)
GFR calc non Af Amer: 51 mL/min — ABNORMAL LOW (ref >60)
GFR, EST AFRICAN AMERICAN: 59 mL/min — AB (ref >60)
Glucose, Bld: 109 mg/dL — ABNORMAL HIGH (ref 65–99)
Potassium: 3.9 mmol/L (ref 3.5–5.1)
Sodium: 132 mmol/L — ABNORMAL LOW (ref 135–145)

## 2016-10-15 LAB — I-STAT TROPONIN, ED: Troponin i, poc: 0 ng/mL (ref 0.00–0.08)

## 2016-10-15 NOTE — ED Provider Notes (Signed)
Emergency Department Provider Note   I have reviewed the triage vital signs and the nursing notes.   HISTORY  Chief Complaint Fall   HPI Wendy Reynolds is a 76 y.o. female with PMH of HLD, HTN, and parkinson's disease resents to the emergency department for evaluation after fall at home. The patient is here with her neighbor who noticed the injury today. The patient fell yesterday evening when walking around the house. She states she was using her walker when she suddenly let go, felt off balance, and fell to the right side. She had bleeding initially and significant swelling to the right side of her face. No loss of consciousness. She took Motrin and applied ice to the area and symptoms improved. Her neighbor found her today with bruising and swelling to the right side of the face and felt like her eyes were abnormal. The patient was also experiencing some nausea and so they presented to the emergency department. She is also having pain in the right middle finger with some mild swelling. No preceding chest pain, palpitations, dyspnea. No vomiting or diarrhea. The patient lives at home with her daughter who is a quadriplegic. There is no home health or other assistance other than neighbors. Patient has considered assisted living and states she may try and go there in the next year after she sells her house. She notes that try to find someone to come in and help with things around the house but the cheapest they could find was $18/hr, which she can't afford.    Past Medical History:  Diagnosis Date  . Alcohol use   . Chest pain 03/04/2012   MI ruled out. Likely anxiety related.  . Depression   . Diastolic dysfunction 03/05/2012   Grade 1. EF 60%  . Falls   . Generalized anxiety disorder 03/04/2012  . Hyperlipidemia 03/05/2012  . Hypertension   . Hypothyroid   . Panic   . Parkinson disease (HCC)   . Parkinson's disease [facility])     Patient Active Problem List   Diagnosis Date Noted  . UTI  (lower urinary tract infection) 09/15/2014  . Acute kidney injury (HCC) 09/15/2014  . Acute encephalopathy 09/15/2014  . Orthostatic hypotension 09/15/2014  . Hypoglycemia 09/15/2014  . Frequent falls   . Renal insufficiency 09/14/2014  . Parkinson's disease (tremor, stiffness, slow motion, unstable posture) (HCC) 05/15/2013  . 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 Diastolic dysfunction 03/05/2012  . Hyperlipidemia 03/05/2012  . Chest pain 03/04/2012  . Generalized anxiety disorder 03/04/2012  . HTN (hypertension) 03/04/2012  . Depression 03/04/2012    Past Surgical History:  Procedure Laterality Date  . DILATION AND CURETTAGE OF UTERUS      Current Outpatient Rx  . Order #: [id number] Class: Historical Med  . Order #: [id number] Class: Historical Med  . Order #: [id number] Class: Historical Med  . Order #: [id number] Class: Normal  . Order #: [id number] Class: Historical Med  . Order #: [id number] Class: Historical Med  . Order #: [id number] Class: Historical Med  . Order #: [id number] Class: Historical Med  . Order #: [id number] Class: Historical Med    Allergies Erythromycin and Penicillins  Family History  Problem Relation Age of Onset  . Depression Maternal Grandfather   . Depression Cousin   . Alcohol abuse Cousin   . Depression Daughter   . Alcohol abuse Mother   . Alcohol abuse Father   . Alcohol abuse Maternal Aunt     Social History Social  History  Substance Use Topics  . Smoking status: Never Smoker  . Smokeless tobacco: Never Used  . Alcohol use Yes     Comment: 2 glasses a night    Review of Systems  Constitutional: No fever/chills Eyes: No visual changes. ENT: No sore throat. Cardiovascular: Denies chest pain. Respiratory: Denies shortness of breath. Gastrointestinal: No abdominal pain. Positive nausea, no vomiting.  No diarrhea.  No constipation. Genitourinary: Negative for dysuria. Musculoskeletal: Negative for  back pain. Positive right face/ear pain and swelling.  Skin: Negative for rash. Neurological: Negative for focal weakness or numbness. Positive HA.   10-point ROS otherwise negative.  ____________________________________________   PHYSICAL EXAM:  VITAL SIGNS: ED Triage Vitals [10/15/16 1511]  Enc Vitals Group     BP (!) 148/67     Pulse Rate 79     Resp 18     Temp 99.3 F (37.4 C)     Temp Source Temporal     SpO2 100 %     Weight 104 lb (47.2 kg)     Height 5\' 2"  (1.575 m)   Constitutional: Alert and oriented. Well appearing and in no acute distress. Eyes: Conjunctivae are normal. PERRL. EOMI.  Head: Atraumatic. Ears:  Healthy appearing ear canals and TMs bilaterally. Bruising over the right ear. No mastoid bruising or tenderness. No auricular hematoma. No laceration or abrasion.  Nose: No congestion/rhinnorhea. Mouth/Throat: Mucous membranes are moist.  Neck: No stridor.  No meningeal signs. No cervical spine tenderness to palpation. Cardiovascular: Normal rate, regular rhythm. Good peripheral circulation. Grossly normal heart sounds.   Respiratory: Normal respiratory effort.  No retractions. Lungs CTAB. Gastrointestinal: Soft and nontender. No distention.  Musculoskeletal: No lower extremity tenderness nor edema. No gross deformities of extremities. Neurologic:  Normal speech and language. No gross focal neurologic deficits are appreciated. Positive intention tremor noted.  Skin:  Skin is warm, dry and intact. No rash noted. Psychiatric: Mood and affect are normal. Speech and behavior are normal.  ____________________________________________   LABS (all labs ordered are listed, but only abnormal results are displayed)  Labs Reviewed  BASIC METABOLIC PANEL - Abnormal; Notable for the following:       Result Value   Sodium 132 (*)    Chloride 98 (*)    Glucose, Bld 109 (*)    Creatinine, Ser 1.04 (*)    GFR calc non Af Amer 51 (*)    GFR calc Af Amer 59 (*)     All other components within normal limits  CBC WITH DIFFERENTIAL/PLATELET  I-STAT TROPOININ, ED   ____________________________________________  EKG   EKG Interpretation  Date/Time:  Monday October 15 2016 20:27:11 EDT Ventricular Rate:  79 PR Interval:    QRS Duration: 79 QT Interval:  401 QTC Calculation: 460 R Axis:   37 Text Interpretation:  Sinus rhythm Borderline low voltage, extremity leads No STEMI. Similar to prior.  Confirmed by LONG MD, JOSHUA [phone]) on 10/15/2016 8:59:49 PM       ____________________________________________  RADIOLOGY  Dg Cervical Spine Complete  Result Date: 10/15/2016 CLINICAL DATA:  Pain following fall EXAM: CERVICAL SPINE - COMPLETE 4+ VIEW COMPARISON:  Cervical spine CT September 30, 2016 FINDINGS: Frontal, lateral, open-mouth odontoid, and bilateral oblique views were obtained. There is no appreciable fracture or spondylolisthesis. Prevertebral soft tissues and predental space regions are normal. There is mild disc space narrowing at C5-6. Other disc spaces appear unremarkable. There is facet hypertrophy at C4-5 bilaterally with slight exit foraminal narrowing bilaterally at  this level. No erosive change. Lung apices are clear. IMPRESSION: Areas of slight osteoarthritic change. No fracture or spondylolisthesis. Electronically Signed   By: Bretta Bang III M.D.   On: 10/15/2016 15:49   Ct Head Wo Contrast  Result Date: 10/15/2016 CLINICAL DATA:  76 year old female with Parkinson's disease. Dizziness since yesterday. Lost balance twice. Last night falling hitting left side of face. Subsequent encounter. EXAM: CT HEAD WITHOUT CONTRAST CT MAXILLOFACIAL WITHOUT CONTRAST TECHNIQUE: Multidetector CT imaging of the head and maxillofacial structures were performed using the standard protocol without intravenous contrast. Multiplanar CT image reconstructions of the maxillofacial structures were also generated. COMPARISON:  09/30/2016. FINDINGS: CT HEAD FINDINGS  Brain: No intracranial hemorrhage or CT evidence of large acute infarct. Mild global atrophy without hydrocephalus. No intracranial mass lesion noted on this unenhanced exam. Vascular: Carotid artery calcification. Skull: No skull fracture. Other: Negative. CT MAXILLOFACIAL FINDINGS Osseous: No fracture. Right temporomandibular joint degenerative changes. Orbits: Orbits appear to be intact. Sinuses: Visualized sinuses are clear. Soft tissues: Soft tissue injury/laceration adjacent to the right ear. IMPRESSION: CT HEAD No skull fracture or intracranial hemorrhage. CT MAXILLOFACIAL No fracture. Soft tissue injury/laceration adjacent to the right ear. Electronically Signed   By: Lacy Duverney M.D.   On: 10/15/2016 16:20   Dg Finger Middle Right  Result Date: 10/15/2016 CLINICAL DATA:  Pain.  Fall. EXAM: RIGHT MIDDLE FINGER 2+V COMPARISON:  No recent prior . FINDINGS: Diffuse degenerative change. Periarticular cortical erosion at the base of the proximal phalanx of the right third digit. An inflammatory arthropathy including rheumatoid arthritis cannot be completely excluded. Clinical correlation suggested. No evidence of acute fracture or dislocation. IMPRESSION: 1. No evidence of acute fracture or dislocation. 2. Diffuse degenerative change. Periarticular cortical erosions noted the base of the middle phalanx of the right third digit. Inflammatory arthropathy including rheumatoid arthritis cannot be completely excluded. Clinical correlation suggested . Electronically Signed   By: Maisie Fus  Register   On: 10/15/2016 15:49   Ct Maxillofacial Wo Contrast  Result Date: 10/15/2016 CLINICAL DATA:  76 year old female with Parkinson's disease. Dizziness since yesterday. Lost balance twice. Last night falling hitting left side of face. Subsequent encounter. EXAM: CT HEAD WITHOUT CONTRAST CT MAXILLOFACIAL WITHOUT CONTRAST TECHNIQUE: Multidetector CT imaging of the head and maxillofacial structures were performed using the  standard protocol without intravenous contrast. Multiplanar CT image reconstructions of the maxillofacial structures were also generated. COMPARISON:  09/30/2016. FINDINGS: CT HEAD FINDINGS Brain: No intracranial hemorrhage or CT evidence of large acute infarct. Mild global atrophy without hydrocephalus. No intracranial mass lesion noted on this unenhanced exam. Vascular: Carotid artery calcification. Skull: No skull fracture. Other: Negative. CT MAXILLOFACIAL FINDINGS Osseous: No fracture. Right temporomandibular joint degenerative changes. Orbits: Orbits appear to be intact. Sinuses: Visualized sinuses are clear. Soft tissues: Soft tissue injury/laceration adjacent to the right ear. IMPRESSION: CT HEAD No skull fracture or intracranial hemorrhage. CT MAXILLOFACIAL No fracture. Soft tissue injury/laceration adjacent to the right ear. Electronically Signed   By: Lacy Duverney M.D.   On: 10/15/2016 16:20    ____________________________________________   PROCEDURES  Procedure(s) performed:   Procedures  None ____________________________________________   INITIAL IMPRESSION / ASSESSMENT AND PLAN / ED COURSE  Pertinent labs & imaging results that were available during my care of the patient were reviewed by me and considered in my medical decision making (see chart for details).  Patient resents the emergency department for evaluation after fall at home. She's had multiple falls and a history of Parkinson's. Suspected underlying  Parkinson's is the reason for the fall but we'll obtain baseline lab work as the patient is not completely sure. No symptoms at this time. Fall was yesterday. No auricular hematoma that requires drainage. No laceration for repair. No clinical signs or symptoms to suggest occult basilar skull fracture. Other than baseline Parkinson's exam findings the patient has no focal neurological deficits.   09:00 PM Patient states she is very anxious to leave. Her CBC is resulted and  is normal. EKG is unremarkable. Imaging from earlier is normal. She'll work consult and will fill out orders for face-to-face and placement. They will call the patient home to arrange home health tomorrow morning.   At this time, I do not feel there is any life-threatening condition present. I have reviewed and discussed all results (EKG, imaging, lab, urine as appropriate), exam findings with patient. I have reviewed nursing notes and appropriate previous records.  I feel the patient is safe to be discharged home without further emergent workup. Discussed usual and customary return precautions. Patient and family (if present) verbalize understanding and are comfortable with this plan.  Patient will follow-up with their primary care provider. If they do not have a primary care provider, information for follow-up has been provided to them. All questions have been answered.  ____________________________________________  FINAL CLINICAL IMPRESSION(S) / ED DIAGNOSES  Final diagnoses:  Fall, initial encounter  Contusion of right ear, initial encounter  Strain of neck muscle, initial encounter  Right hand pain     MEDICATIONS GIVEN DURING THIS VISIT:  None  NEW OUTPATIENT MEDICATIONS STARTED DURING THIS VISIT:  None   Note:  This document was prepared using Dragon voice recognition software and may include unintentional dictation errors.  Alona BeneJoshua Long, MD Emergency Medicine   Maia PlanJoshua G Long, MD 10/16/16 [phone]

## 2016-10-15 NOTE — ED Notes (Signed)
Called to patient room by patient. Patient states "I want to go home. I will leave against medical advice if I need to but I want him to discharge me so I can go." Advised Dr Jacqulyn BathLong.

## 2016-10-15 NOTE — ED Notes (Signed)
Patient attempting to get out of bed at this time. States "I need to go to the bathroom now." Patient ambulated to bathroom with assistance x 2. Patient's gait unsteady. Patient returned to room and placed back in bed with family at bedside. Bed in lowest position with side rails up x 2.

## 2016-10-15 NOTE — ED Triage Notes (Addendum)
Pt reports history of parkinson's and reports dizziness since yesterday and lost balance x2 last night. Pt reports initial fall from last night pt reports fell on left side of face. Pt reports had stitches placed on inside of upper lip x7 days from past fall as well. Pt reports second fall from 10/14/17, pt reports fell backwards and hit head. Pt reports moderate bleeding at time of fall. Bleeding controlled at triage. Pt denies being on any blood thinners. Pt denies loc. Moderate contusion noted to left side of face and upper lip.

## 2016-10-15 NOTE — Discharge Instructions (Signed)
You were seen in the Emergency Department (ED) today for a head injury.  Based on your evaluation, you may have sustained a concussion (or bruise) to your brain.  A CT scan was done, it did not show any evidence of serious injury or bleeding.    Symptoms to expect from a concussion include nausea, mild to moderate headache, difficulty concentrating or sleeping, and mild lightheadedness.  These symptoms should improve over the next few days to weeks, but it may take many weeks before you feel back to normal.  Return to the emergency department or follow-up with your primary care doctor if your symptoms are not improving over this time.  Signs of a more serious head injury include vomiting, severe headache, excessive sleepiness or confusion, and weakness or numbness in your face, arms or legs.  Return immediately to the Emergency Department if you experience any of these more concerning symptoms.    Rest, avoid strenuous physical or mental activity, and avoid activities that could potentially result in another head injury until all your symptoms from this head injury are completely resolved for at least 2-3 weeks. You may take ibuprofen or acetaminophen over the counter according to label instructions for mild headache or scalp soreness.

## 2016-10-16 NOTE — ED Notes (Signed)
Contacted Social Worker Diannia Ruder([place]) as requested to FU with [facility].

## 2016-10-16 NOTE — Care Management (Signed)
[facility]. Pt has already DC'd. CM called pt at her home. [facility] and has requested [facility] as agency of choice. Pt aware HH has 48hrs to make visit. Therisa DoyneKathy Cheek, of [facility], aware of referral and will obtain pt info from chart.

## 2016-10-17 ENCOUNTER — Other Ambulatory Visit: Payer: Self-pay

## 2016-10-17 NOTE — Patient Outreach (Signed)
[facility] Care Management  10/17/2016  Claretta Fraiseamela H Luis 04/19/1941 [id number]   Telephone call to patient for screen from ED utilization.  No answer.  HIPAA compliant voice message left.    Plan: RN health Coach will attempt patient again in ten business days.    Bary Lericheionne J Marymargaret Kirker, RN, MSN [facility] Care Management RN Telephonic Health Coach [phone]

## 2016-10-19 ENCOUNTER — Other Ambulatory Visit: Payer: Self-pay

## 2016-10-19 NOTE — Patient Outreach (Signed)
[facility]) Care Management  10/19/2016  Wendy Reynolds 08/13/1940 [id number]   2nd Telephone call to patient for screening call for ED Utilization. No answer.  HIPAA compliant left on home and mobile numbers.    Plan: RN Health Coach will attempt patient again in the next ten business days.    Bary Lericheionne J Madigan Rosensteel, RN, MSN [facility] Care Management RN Telephonic Health Coach [phone]

## 2016-10-23 ENCOUNTER — Other Ambulatory Visit: Payer: Self-pay

## 2016-10-23 DIAGNOSIS — S161XXD Strain of muscle, fascia and tendon at neck level, subsequent encounter: Secondary | ICD-10-CM | POA: Diagnosis not present

## 2016-10-23 DIAGNOSIS — F411 Generalized anxiety disorder: Secondary | ICD-10-CM | POA: Diagnosis not present

## 2016-10-23 DIAGNOSIS — F329 Major depressive disorder, single episode, unspecified: Secondary | ICD-10-CM | POA: Diagnosis not present

## 2016-10-23 DIAGNOSIS — Z9181 History of falling: Secondary | ICD-10-CM | POA: Diagnosis not present

## 2016-10-23 DIAGNOSIS — I5189 Other ill-defined heart diseases: Secondary | ICD-10-CM | POA: Diagnosis not present

## 2016-10-23 DIAGNOSIS — S00431D Contusion of right ear, subsequent encounter: Secondary | ICD-10-CM | POA: Diagnosis not present

## 2016-10-23 DIAGNOSIS — I119 Hypertensive heart disease without heart failure: Secondary | ICD-10-CM | POA: Diagnosis not present

## 2016-10-23 DIAGNOSIS — G20 Parkinson's disease: Secondary | ICD-10-CM | POA: Diagnosis not present

## 2016-10-23 NOTE — Patient Outreach (Signed)
[facility] Care Management  10/23/2016  Claretta Fraiseamela H Maslin 09/30/1940 [id number]   3rd telephone call to patient for ED utilization screening.  No answer.  HIPAA compliant voice message left.    Plan: RN Health Coach will send letter to attempt patient.  If no answer within 10 business days will proceed with case closure.  Bary Lericheionne J Layal Javid, RN, MSN [facility] Care Management RN Telephonic Health Coach [phone]

## 2016-10-24 DIAGNOSIS — R682 Dry mouth, unspecified: Secondary | ICD-10-CM | POA: Diagnosis not present

## 2016-10-24 DIAGNOSIS — F419 Anxiety disorder, unspecified: Secondary | ICD-10-CM | POA: Diagnosis not present

## 2016-10-24 DIAGNOSIS — F329 Major depressive disorder, single episode, unspecified: Secondary | ICD-10-CM | POA: Diagnosis not present

## 2016-10-24 DIAGNOSIS — Z79899 Other long term (current) drug therapy: Secondary | ICD-10-CM | POA: Diagnosis not present

## 2016-10-24 DIAGNOSIS — H8113 Benign paroxysmal vertigo, bilateral: Secondary | ICD-10-CM | POA: Diagnosis not present

## 2016-10-24 DIAGNOSIS — G20 Parkinson's disease: Secondary | ICD-10-CM | POA: Diagnosis not present

## 2016-10-24 DIAGNOSIS — S0083XD Contusion of other part of head, subsequent encounter: Secondary | ICD-10-CM | POA: Diagnosis not present

## 2016-10-24 DIAGNOSIS — R259 Unspecified abnormal involuntary movements: Secondary | ICD-10-CM | POA: Diagnosis not present

## 2016-10-24 DIAGNOSIS — S0990XD Unspecified injury of head, subsequent encounter: Secondary | ICD-10-CM | POA: Diagnosis not present

## 2016-10-24 DIAGNOSIS — Z881 Allergy status to other antibiotic agents status: Secondary | ICD-10-CM | POA: Diagnosis not present

## 2016-10-24 DIAGNOSIS — F330 Major depressive disorder, recurrent, mild: Secondary | ICD-10-CM | POA: Diagnosis not present

## 2016-10-24 DIAGNOSIS — E785 Hyperlipidemia, unspecified: Secondary | ICD-10-CM | POA: Diagnosis not present

## 2016-10-24 DIAGNOSIS — R350 Frequency of micturition: Secondary | ICD-10-CM | POA: Diagnosis not present

## 2016-10-24 DIAGNOSIS — Z880 Allergy status to penicillin: Secondary | ICD-10-CM | POA: Diagnosis not present

## 2016-10-24 DIAGNOSIS — Z681 Body mass index (BMI) 19 or less, adult: Secondary | ICD-10-CM | POA: Diagnosis not present

## 2016-10-24 DIAGNOSIS — Z7982 Long term (current) use of aspirin: Secondary | ICD-10-CM | POA: Diagnosis not present

## 2016-10-24 DIAGNOSIS — I10 Essential (primary) hypertension: Secondary | ICD-10-CM | POA: Diagnosis not present

## 2016-10-25 DIAGNOSIS — I119 Hypertensive heart disease without heart failure: Secondary | ICD-10-CM | POA: Diagnosis not present

## 2016-10-25 DIAGNOSIS — F411 Generalized anxiety disorder: Secondary | ICD-10-CM | POA: Diagnosis not present

## 2016-10-25 DIAGNOSIS — S161XXD Strain of muscle, fascia and tendon at neck level, subsequent encounter: Secondary | ICD-10-CM | POA: Diagnosis not present

## 2016-10-25 DIAGNOSIS — I5189 Other ill-defined heart diseases: Secondary | ICD-10-CM | POA: Diagnosis not present

## 2016-10-25 DIAGNOSIS — G20 Parkinson's disease: Secondary | ICD-10-CM | POA: Diagnosis not present

## 2016-10-25 DIAGNOSIS — Z9181 History of falling: Secondary | ICD-10-CM | POA: Diagnosis not present

## 2016-10-25 DIAGNOSIS — S00431D Contusion of right ear, subsequent encounter: Secondary | ICD-10-CM | POA: Diagnosis not present

## 2016-10-25 DIAGNOSIS — F329 Major depressive disorder, single episode, unspecified: Secondary | ICD-10-CM | POA: Diagnosis not present

## 2016-10-26 DIAGNOSIS — I5189 Other ill-defined heart diseases: Secondary | ICD-10-CM | POA: Diagnosis not present

## 2016-10-26 DIAGNOSIS — Z9181 History of falling: Secondary | ICD-10-CM | POA: Diagnosis not present

## 2016-10-26 DIAGNOSIS — F329 Major depressive disorder, single episode, unspecified: Secondary | ICD-10-CM | POA: Diagnosis not present

## 2016-10-26 DIAGNOSIS — G20 Parkinson's disease: Secondary | ICD-10-CM | POA: Diagnosis not present

## 2016-10-26 DIAGNOSIS — S161XXD Strain of muscle, fascia and tendon at neck level, subsequent encounter: Secondary | ICD-10-CM | POA: Diagnosis not present

## 2016-10-26 DIAGNOSIS — F411 Generalized anxiety disorder: Secondary | ICD-10-CM | POA: Diagnosis not present

## 2016-10-26 DIAGNOSIS — S00431D Contusion of right ear, subsequent encounter: Secondary | ICD-10-CM | POA: Diagnosis not present

## 2016-10-26 DIAGNOSIS — I119 Hypertensive heart disease without heart failure: Secondary | ICD-10-CM | POA: Diagnosis not present

## 2016-10-30 DIAGNOSIS — I119 Hypertensive heart disease without heart failure: Secondary | ICD-10-CM | POA: Diagnosis not present

## 2016-10-30 DIAGNOSIS — Z9181 History of falling: Secondary | ICD-10-CM | POA: Diagnosis not present

## 2016-10-30 DIAGNOSIS — S00431D Contusion of right ear, subsequent encounter: Secondary | ICD-10-CM | POA: Diagnosis not present

## 2016-10-30 DIAGNOSIS — G20 Parkinson's disease: Secondary | ICD-10-CM | POA: Diagnosis not present

## 2016-10-30 DIAGNOSIS — S161XXD Strain of muscle, fascia and tendon at neck level, subsequent encounter: Secondary | ICD-10-CM | POA: Diagnosis not present

## 2016-10-30 DIAGNOSIS — F411 Generalized anxiety disorder: Secondary | ICD-10-CM | POA: Diagnosis not present

## 2016-10-30 DIAGNOSIS — I5189 Other ill-defined heart diseases: Secondary | ICD-10-CM | POA: Diagnosis not present

## 2016-10-30 DIAGNOSIS — F329 Major depressive disorder, single episode, unspecified: Secondary | ICD-10-CM | POA: Diagnosis not present

## 2016-10-31 DIAGNOSIS — S00431D Contusion of right ear, subsequent encounter: Secondary | ICD-10-CM | POA: Diagnosis not present

## 2016-10-31 DIAGNOSIS — Z9181 History of falling: Secondary | ICD-10-CM | POA: Diagnosis not present

## 2016-10-31 DIAGNOSIS — F329 Major depressive disorder, single episode, unspecified: Secondary | ICD-10-CM | POA: Diagnosis not present

## 2016-10-31 DIAGNOSIS — G20 Parkinson's disease: Secondary | ICD-10-CM | POA: Diagnosis not present

## 2016-10-31 DIAGNOSIS — I5189 Other ill-defined heart diseases: Secondary | ICD-10-CM | POA: Diagnosis not present

## 2016-10-31 DIAGNOSIS — I119 Hypertensive heart disease without heart failure: Secondary | ICD-10-CM | POA: Diagnosis not present

## 2016-10-31 DIAGNOSIS — F411 Generalized anxiety disorder: Secondary | ICD-10-CM | POA: Diagnosis not present

## 2016-10-31 DIAGNOSIS — S161XXD Strain of muscle, fascia and tendon at neck level, subsequent encounter: Secondary | ICD-10-CM | POA: Diagnosis not present

## 2016-11-01 DIAGNOSIS — S00431D Contusion of right ear, subsequent encounter: Secondary | ICD-10-CM | POA: Diagnosis not present

## 2016-11-01 DIAGNOSIS — I119 Hypertensive heart disease without heart failure: Secondary | ICD-10-CM | POA: Diagnosis not present

## 2016-11-01 DIAGNOSIS — S161XXD Strain of muscle, fascia and tendon at neck level, subsequent encounter: Secondary | ICD-10-CM | POA: Diagnosis not present

## 2016-11-01 DIAGNOSIS — G20 Parkinson's disease: Secondary | ICD-10-CM | POA: Diagnosis not present

## 2016-11-01 DIAGNOSIS — F329 Major depressive disorder, single episode, unspecified: Secondary | ICD-10-CM | POA: Diagnosis not present

## 2016-11-01 DIAGNOSIS — I5189 Other ill-defined heart diseases: Secondary | ICD-10-CM | POA: Diagnosis not present

## 2016-11-01 DIAGNOSIS — Z9181 History of falling: Secondary | ICD-10-CM | POA: Diagnosis not present

## 2016-11-01 DIAGNOSIS — F411 Generalized anxiety disorder: Secondary | ICD-10-CM | POA: Diagnosis not present

## 2016-11-02 DIAGNOSIS — S00431D Contusion of right ear, subsequent encounter: Secondary | ICD-10-CM | POA: Diagnosis not present

## 2016-11-02 DIAGNOSIS — I5189 Other ill-defined heart diseases: Secondary | ICD-10-CM | POA: Diagnosis not present

## 2016-11-02 DIAGNOSIS — I119 Hypertensive heart disease without heart failure: Secondary | ICD-10-CM | POA: Diagnosis not present

## 2016-11-02 DIAGNOSIS — S161XXD Strain of muscle, fascia and tendon at neck level, subsequent encounter: Secondary | ICD-10-CM | POA: Diagnosis not present

## 2016-11-02 DIAGNOSIS — F411 Generalized anxiety disorder: Secondary | ICD-10-CM | POA: Diagnosis not present

## 2016-11-02 DIAGNOSIS — G20 Parkinson's disease: Secondary | ICD-10-CM | POA: Diagnosis not present

## 2016-11-02 DIAGNOSIS — F329 Major depressive disorder, single episode, unspecified: Secondary | ICD-10-CM | POA: Diagnosis not present

## 2016-11-02 DIAGNOSIS — Z9181 History of falling: Secondary | ICD-10-CM | POA: Diagnosis not present

## 2016-11-05 DIAGNOSIS — F329 Major depressive disorder, single episode, unspecified: Secondary | ICD-10-CM | POA: Diagnosis not present

## 2016-11-05 DIAGNOSIS — S161XXD Strain of muscle, fascia and tendon at neck level, subsequent encounter: Secondary | ICD-10-CM | POA: Diagnosis not present

## 2016-11-05 DIAGNOSIS — I5189 Other ill-defined heart diseases: Secondary | ICD-10-CM | POA: Diagnosis not present

## 2016-11-05 DIAGNOSIS — I119 Hypertensive heart disease without heart failure: Secondary | ICD-10-CM | POA: Diagnosis not present

## 2016-11-05 DIAGNOSIS — G20 Parkinson's disease: Secondary | ICD-10-CM | POA: Diagnosis not present

## 2016-11-05 DIAGNOSIS — Z9181 History of falling: Secondary | ICD-10-CM | POA: Diagnosis not present

## 2016-11-05 DIAGNOSIS — S00431D Contusion of right ear, subsequent encounter: Secondary | ICD-10-CM | POA: Diagnosis not present

## 2016-11-05 DIAGNOSIS — F411 Generalized anxiety disorder: Secondary | ICD-10-CM | POA: Diagnosis not present

## 2016-11-06 DIAGNOSIS — I119 Hypertensive heart disease without heart failure: Secondary | ICD-10-CM | POA: Diagnosis not present

## 2016-11-06 DIAGNOSIS — F329 Major depressive disorder, single episode, unspecified: Secondary | ICD-10-CM | POA: Diagnosis not present

## 2016-11-06 DIAGNOSIS — S00431D Contusion of right ear, subsequent encounter: Secondary | ICD-10-CM | POA: Diagnosis not present

## 2016-11-06 DIAGNOSIS — Z9181 History of falling: Secondary | ICD-10-CM | POA: Diagnosis not present

## 2016-11-06 DIAGNOSIS — I5189 Other ill-defined heart diseases: Secondary | ICD-10-CM | POA: Diagnosis not present

## 2016-11-06 DIAGNOSIS — G20 Parkinson's disease: Secondary | ICD-10-CM | POA: Diagnosis not present

## 2016-11-06 DIAGNOSIS — F411 Generalized anxiety disorder: Secondary | ICD-10-CM | POA: Diagnosis not present

## 2016-11-06 DIAGNOSIS — S161XXD Strain of muscle, fascia and tendon at neck level, subsequent encounter: Secondary | ICD-10-CM | POA: Diagnosis not present

## 2016-11-07 ENCOUNTER — Other Ambulatory Visit: Payer: Self-pay

## 2016-11-07 DIAGNOSIS — S00431D Contusion of right ear, subsequent encounter: Secondary | ICD-10-CM | POA: Diagnosis not present

## 2016-11-07 DIAGNOSIS — I5189 Other ill-defined heart diseases: Secondary | ICD-10-CM | POA: Diagnosis not present

## 2016-11-07 DIAGNOSIS — S161XXD Strain of muscle, fascia and tendon at neck level, subsequent encounter: Secondary | ICD-10-CM | POA: Diagnosis not present

## 2016-11-07 DIAGNOSIS — G20 Parkinson's disease: Secondary | ICD-10-CM | POA: Diagnosis not present

## 2016-11-07 DIAGNOSIS — F411 Generalized anxiety disorder: Secondary | ICD-10-CM | POA: Diagnosis not present

## 2016-11-07 DIAGNOSIS — Z9181 History of falling: Secondary | ICD-10-CM | POA: Diagnosis not present

## 2016-11-07 DIAGNOSIS — F329 Major depressive disorder, single episode, unspecified: Secondary | ICD-10-CM | POA: Diagnosis not present

## 2016-11-07 DIAGNOSIS — I119 Hypertensive heart disease without heart failure: Secondary | ICD-10-CM | POA: Diagnosis not present

## 2016-11-07 NOTE — Patient Outreach (Signed)
[facility]) Care Management  11/07/2016  Wendy Reynolds Feb 12, 1941 [id number]   Patient has not responded to calls or letter.  Will proceed with case closure.  Will notify care management assistant of case status.    Bary Leriche, RN, MSN [facility] Care Management RN Telephonic Health Coach [phone]

## 2016-11-08 DIAGNOSIS — I5189 Other ill-defined heart diseases: Secondary | ICD-10-CM | POA: Diagnosis not present

## 2016-11-08 DIAGNOSIS — I119 Hypertensive heart disease without heart failure: Secondary | ICD-10-CM | POA: Diagnosis not present

## 2016-11-08 DIAGNOSIS — S00431D Contusion of right ear, subsequent encounter: Secondary | ICD-10-CM | POA: Diagnosis not present

## 2016-11-08 DIAGNOSIS — G20 Parkinson's disease: Secondary | ICD-10-CM | POA: Diagnosis not present

## 2016-11-08 DIAGNOSIS — F411 Generalized anxiety disorder: Secondary | ICD-10-CM | POA: Diagnosis not present

## 2016-11-08 DIAGNOSIS — Z9181 History of falling: Secondary | ICD-10-CM | POA: Diagnosis not present

## 2016-11-08 DIAGNOSIS — F329 Major depressive disorder, single episode, unspecified: Secondary | ICD-10-CM | POA: Diagnosis not present

## 2016-11-08 DIAGNOSIS — S161XXD Strain of muscle, fascia and tendon at neck level, subsequent encounter: Secondary | ICD-10-CM | POA: Diagnosis not present

## 2016-11-09 DIAGNOSIS — I119 Hypertensive heart disease without heart failure: Secondary | ICD-10-CM | POA: Diagnosis not present

## 2016-11-09 DIAGNOSIS — S00431D Contusion of right ear, subsequent encounter: Secondary | ICD-10-CM | POA: Diagnosis not present

## 2016-11-09 DIAGNOSIS — F411 Generalized anxiety disorder: Secondary | ICD-10-CM | POA: Diagnosis not present

## 2016-11-09 DIAGNOSIS — S161XXD Strain of muscle, fascia and tendon at neck level, subsequent encounter: Secondary | ICD-10-CM | POA: Diagnosis not present

## 2016-11-09 DIAGNOSIS — I5189 Other ill-defined heart diseases: Secondary | ICD-10-CM | POA: Diagnosis not present

## 2016-11-09 DIAGNOSIS — Z9181 History of falling: Secondary | ICD-10-CM | POA: Diagnosis not present

## 2016-11-09 DIAGNOSIS — F329 Major depressive disorder, single episode, unspecified: Secondary | ICD-10-CM | POA: Diagnosis not present

## 2016-11-09 DIAGNOSIS — G20 Parkinson's disease: Secondary | ICD-10-CM | POA: Diagnosis not present

## 2016-11-12 DIAGNOSIS — S161XXD Strain of muscle, fascia and tendon at neck level, subsequent encounter: Secondary | ICD-10-CM | POA: Diagnosis not present

## 2016-11-12 DIAGNOSIS — Z9181 History of falling: Secondary | ICD-10-CM | POA: Diagnosis not present

## 2016-11-12 DIAGNOSIS — F411 Generalized anxiety disorder: Secondary | ICD-10-CM | POA: Diagnosis not present

## 2016-11-12 DIAGNOSIS — I119 Hypertensive heart disease without heart failure: Secondary | ICD-10-CM | POA: Diagnosis not present

## 2016-11-12 DIAGNOSIS — F329 Major depressive disorder, single episode, unspecified: Secondary | ICD-10-CM | POA: Diagnosis not present

## 2016-11-12 DIAGNOSIS — G20 Parkinson's disease: Secondary | ICD-10-CM | POA: Diagnosis not present

## 2016-11-12 DIAGNOSIS — I5189 Other ill-defined heart diseases: Secondary | ICD-10-CM | POA: Diagnosis not present

## 2016-11-12 DIAGNOSIS — S00431D Contusion of right ear, subsequent encounter: Secondary | ICD-10-CM | POA: Diagnosis not present

## 2016-11-16 DIAGNOSIS — S161XXD Strain of muscle, fascia and tendon at neck level, subsequent encounter: Secondary | ICD-10-CM | POA: Diagnosis not present

## 2016-11-16 DIAGNOSIS — G20 Parkinson's disease: Secondary | ICD-10-CM | POA: Diagnosis not present

## 2016-11-16 DIAGNOSIS — I5189 Other ill-defined heart diseases: Secondary | ICD-10-CM | POA: Diagnosis not present

## 2016-11-16 DIAGNOSIS — Z9181 History of falling: Secondary | ICD-10-CM | POA: Diagnosis not present

## 2016-11-16 DIAGNOSIS — F411 Generalized anxiety disorder: Secondary | ICD-10-CM | POA: Diagnosis not present

## 2016-11-16 DIAGNOSIS — I119 Hypertensive heart disease without heart failure: Secondary | ICD-10-CM | POA: Diagnosis not present

## 2016-11-16 DIAGNOSIS — F329 Major depressive disorder, single episode, unspecified: Secondary | ICD-10-CM | POA: Diagnosis not present

## 2016-11-16 DIAGNOSIS — S00431D Contusion of right ear, subsequent encounter: Secondary | ICD-10-CM | POA: Diagnosis not present

## 2016-11-19 DIAGNOSIS — F411 Generalized anxiety disorder: Secondary | ICD-10-CM | POA: Diagnosis not present

## 2016-11-19 DIAGNOSIS — F329 Major depressive disorder, single episode, unspecified: Secondary | ICD-10-CM | POA: Diagnosis not present

## 2016-11-19 DIAGNOSIS — I119 Hypertensive heart disease without heart failure: Secondary | ICD-10-CM | POA: Diagnosis not present

## 2016-11-19 DIAGNOSIS — S161XXD Strain of muscle, fascia and tendon at neck level, subsequent encounter: Secondary | ICD-10-CM | POA: Diagnosis not present

## 2016-11-19 DIAGNOSIS — G20 Parkinson's disease: Secondary | ICD-10-CM | POA: Diagnosis not present

## 2016-11-19 DIAGNOSIS — Z9181 History of falling: Secondary | ICD-10-CM | POA: Diagnosis not present

## 2016-11-19 DIAGNOSIS — I5189 Other ill-defined heart diseases: Secondary | ICD-10-CM | POA: Diagnosis not present

## 2016-11-19 DIAGNOSIS — S00431D Contusion of right ear, subsequent encounter: Secondary | ICD-10-CM | POA: Diagnosis not present

## 2016-11-20 DIAGNOSIS — Z9181 History of falling: Secondary | ICD-10-CM | POA: Diagnosis not present

## 2016-11-20 DIAGNOSIS — S161XXD Strain of muscle, fascia and tendon at neck level, subsequent encounter: Secondary | ICD-10-CM | POA: Diagnosis not present

## 2016-11-20 DIAGNOSIS — G20 Parkinson's disease: Secondary | ICD-10-CM | POA: Diagnosis not present

## 2016-11-20 DIAGNOSIS — I5189 Other ill-defined heart diseases: Secondary | ICD-10-CM | POA: Diagnosis not present

## 2016-11-20 DIAGNOSIS — F329 Major depressive disorder, single episode, unspecified: Secondary | ICD-10-CM | POA: Diagnosis not present

## 2016-11-20 DIAGNOSIS — F411 Generalized anxiety disorder: Secondary | ICD-10-CM | POA: Diagnosis not present

## 2016-11-20 DIAGNOSIS — I119 Hypertensive heart disease without heart failure: Secondary | ICD-10-CM | POA: Diagnosis not present

## 2016-11-20 DIAGNOSIS — S00431D Contusion of right ear, subsequent encounter: Secondary | ICD-10-CM | POA: Diagnosis not present

## 2016-11-22 DIAGNOSIS — F411 Generalized anxiety disorder: Secondary | ICD-10-CM | POA: Diagnosis not present

## 2016-11-22 DIAGNOSIS — I119 Hypertensive heart disease without heart failure: Secondary | ICD-10-CM | POA: Diagnosis not present

## 2016-11-22 DIAGNOSIS — S161XXD Strain of muscle, fascia and tendon at neck level, subsequent encounter: Secondary | ICD-10-CM | POA: Diagnosis not present

## 2016-11-22 DIAGNOSIS — F329 Major depressive disorder, single episode, unspecified: Secondary | ICD-10-CM | POA: Diagnosis not present

## 2016-11-22 DIAGNOSIS — I5189 Other ill-defined heart diseases: Secondary | ICD-10-CM | POA: Diagnosis not present

## 2016-11-22 DIAGNOSIS — G20 Parkinson's disease: Secondary | ICD-10-CM | POA: Diagnosis not present

## 2016-11-22 DIAGNOSIS — Z9181 History of falling: Secondary | ICD-10-CM | POA: Diagnosis not present

## 2016-11-22 DIAGNOSIS — S00431D Contusion of right ear, subsequent encounter: Secondary | ICD-10-CM | POA: Diagnosis not present

## 2017-01-08 ENCOUNTER — Encounter (HOSPITAL_COMMUNITY): Payer: Self-pay | Admitting: Emergency Medicine

## 2017-01-08 ENCOUNTER — Emergency Department (HOSPITAL_COMMUNITY)
Admission: EM | Admit: 2017-01-08 | Discharge: 2017-01-08 | Disposition: A | Discharge status: Home / Self Care | Payer: Medicare HMO | Attending: Emergency Medicine | Admitting: Emergency Medicine

## 2017-01-08 DIAGNOSIS — Z9181 History of falling: Secondary | ICD-10-CM | POA: Insufficient documentation

## 2017-01-08 DIAGNOSIS — Z5321 Procedure and treatment not carried out due to patient leaving prior to being seen by health care provider: Secondary | ICD-10-CM | POA: Diagnosis not present

## 2017-01-08 NOTE — ED Notes (Signed)
Triage RN reports pt says she is going to leave.  Says she felt better.  Triage RN encouraged pt to stay.  Pt got up from waiting room chair and told registration staff she felt better and is leaving.  Attempted to call pt at home and on cell to ask her to return for evaluation but no answer.  Left message for pt to call back.  Triage RN stressed importance of being evaluated because pt hit her head when she fell today and reported she fell yesterday as well.

## 2017-01-08 NOTE — ED Triage Notes (Signed)
Pt fell yesterday taking dog out while using walker. Pt has bruising around right eye. Pt fell today getting groc cart. Fell backwards.has blood noted to back of head but no lac found. A/o. Pt states uses walker.

## 2017-02-18 ENCOUNTER — Emergency Department (HOSPITAL_COMMUNITY): Payer: Medicare HMO

## 2017-02-18 ENCOUNTER — Emergency Department (HOSPITAL_COMMUNITY)
Admission: EM | Admit: 2017-02-18 | Discharge: 2017-02-18 | Disposition: A | Discharge status: Home / Self Care | Payer: Medicare HMO | Attending: Emergency Medicine | Admitting: Emergency Medicine

## 2017-02-18 ENCOUNTER — Encounter (HOSPITAL_COMMUNITY): Payer: Self-pay

## 2017-02-18 DIAGNOSIS — S0083XA Contusion of other part of head, initial encounter: Secondary | ICD-10-CM | POA: Diagnosis not present

## 2017-02-18 DIAGNOSIS — S6992XA Unspecified injury of left wrist, hand and finger(s), initial encounter: Secondary | ICD-10-CM | POA: Diagnosis not present

## 2017-02-18 DIAGNOSIS — S01511A Laceration without foreign body of lip, initial encounter: Secondary | ICD-10-CM | POA: Diagnosis not present

## 2017-02-18 DIAGNOSIS — Y9301 Activity, walking, marching and hiking: Secondary | ICD-10-CM | POA: Insufficient documentation

## 2017-02-18 DIAGNOSIS — W19XXXA Unspecified fall, initial encounter: Secondary | ICD-10-CM | POA: Insufficient documentation

## 2017-02-18 DIAGNOSIS — Y92481 Parking lot as the place of occurrence of the external cause: Secondary | ICD-10-CM | POA: Insufficient documentation

## 2017-02-18 DIAGNOSIS — S098XXA Other specified injuries of head, initial encounter: Secondary | ICD-10-CM | POA: Diagnosis present

## 2017-02-18 DIAGNOSIS — I10 Essential (primary) hypertension: Secondary | ICD-10-CM | POA: Insufficient documentation

## 2017-02-18 DIAGNOSIS — Y999 Unspecified external cause status: Secondary | ICD-10-CM | POA: Diagnosis not present

## 2017-02-18 DIAGNOSIS — G20 Parkinson's disease: Secondary | ICD-10-CM | POA: Diagnosis not present

## 2017-02-18 DIAGNOSIS — S0990XA Unspecified injury of head, initial encounter: Secondary | ICD-10-CM

## 2017-02-18 DIAGNOSIS — E039 Hypothyroidism, unspecified: Secondary | ICD-10-CM | POA: Diagnosis not present

## 2017-02-18 DIAGNOSIS — R296 Repeated falls: Secondary | ICD-10-CM | POA: Diagnosis not present

## 2017-02-18 MED ORDER — LIDOCAINE HCL (PF) 1 % IJ SOLN
5.0000 mL | Freq: Once | INTRAMUSCULAR | Status: AC
  Administered 2017-02-18: 5 mL
  Filled 2017-02-18: qty 5

## 2017-02-18 MED ORDER — POVIDONE-IODINE 10 % EX SOLN
CUTANEOUS | Status: AC
  Filled 2017-02-18: qty 15

## 2017-02-18 NOTE — Discharge Instructions (Signed)
You were seen in the Emergency Department (ED) today for a head injury.  Based on your evaluation, you may have sustained a concussion (or bruise) to your brain.  If you had a CT scan done, it did not show any evidence of serious injury or bleeding.    Symptoms to expect from a concussion include nausea, mild to moderate headache, difficulty concentrating or sleeping, and mild lightheadedness.  These symptoms should improve over the next few days to weeks, but it may take many weeks before you feel back to normal.  Return to the emergency department or follow-up with your primary care doctor if your symptoms are not improving over this time.  Signs of a more serious head injury include vomiting, severe headache, excessive sleepiness or confusion, and weakness or numbness in your face, arms or legs.  Return immediately to the Emergency Department if you experience any of these more concerning symptoms.    Rest, avoid strenuous physical or mental activity, and avoid activities that could potentially result in another head injury until all your symptoms from this head injury are completely resolved for at least 2-3 weeks.  You may take acetaminophen (Tylenol) over the counter according to label instructions for mild headache or scalp soreness.

## 2017-02-18 NOTE — ED Triage Notes (Signed)
Reports of multiple falls after tripping at home. Las laceration noted above left eye, right side of lip and bruising noted to left side of face. Reports of left hand pain. Fell onto hard woods floor. Denies loc.

## 2017-02-18 NOTE — ED Provider Notes (Signed)
Emergency Department Provider Note   I have reviewed the triage vital signs and the nursing notes.   HISTORY  Chief Complaint Fall   HPI Wendy Reynolds is a 76 y.o. female with PMH of HLD, HTN, and Parkinson's disease with unsteady gait and frequent falls presents to the emergency room in for evaluation of fall today while getting groceries. The patient was at [facility] and was carrying groceries back to her car. Patient states that she typically walks with a walker but could not today because she was carrying grocery bags. She lost her footing and fell forward striking her face on the ground. Denies any head trauma or loss of consciousness. She bit into her upper lip and has had some bleeding in that area. No vision changes. No difficulty breathing. The patient notes frequent falls at home. She lives with her daughter who is wheelchair-bound. She had a fall yesterday with pain in the left hand and some bruising noted today. She notes other areas with healing lacerations. She reports they have recently hired someone to come and help with tasks around the house for more assistance.   Past Medical History:  Diagnosis Date  . Alcohol use   . Chest pain 03/04/2012   MI ruled out. Likely anxiety related.  . Depression   . Diastolic dysfunction 03/05/2012   Grade 1. EF 60%  . Falls   . Generalized anxiety disorder 03/04/2012  . Hyperlipidemia 03/05/2012  . Hypertension   . Hypothyroid   . Panic   . Parkinson disease (HCC)   . Parkinson's disease [facility])     Patient Active Problem List   Diagnosis Date Noted  . UTI (lower urinary tract infection) 09/15/2014  . Acute kidney injury (HCC) 09/15/2014  . Acute encephalopathy 09/15/2014  . Orthostatic hypotension 09/15/2014  . Hypoglycemia 09/15/2014  . Frequent falls   . Renal insufficiency 09/14/2014  . Parkinson's disease (tremor, stiffness, slow motion, unstable posture) (HCC) 05/15/2013  . 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 Diastolic dysfunction 03/05/2012  . Hyperlipidemia 03/05/2012  . Chest pain 03/04/2012  . Generalized anxiety disorder 03/04/2012  . HTN (hypertension) 03/04/2012  . Depression 03/04/2012    Past Surgical History:  Procedure Laterality Date  . DILATION AND CURETTAGE OF UTERUS      Current Outpatient Rx  . Order #: [id number] Class: Historical Med  . Order #: [id number] Class: Historical Med  . Order #: [id number] Class: Historical Med  . Order #: [id number] Class: Normal  . Order #: [id number] Class: Historical Med  . Order #: [id number] Class: Historical Med  . Order #: [id number] Class: Historical Med  . Order #: [id number] Class: Historical Med  . Order #: [id number] Class: Historical Med    Allergies Erythromycin and Penicillins  Family History  Problem Relation Age of Onset  . Depression Maternal Grandfather   . Depression Cousin   . Alcohol abuse Cousin   . Depression Daughter   . Alcohol abuse Mother   . Alcohol abuse Father   . Alcohol abuse Maternal Aunt     Social History Social History  Substance Use Topics  . Smoking status: Never Smoker  . Smokeless tobacco: Never Used  . Alcohol use Yes     Comment: 2 glasses a night    Review of Systems  Constitutional: No fever/chills. Positive fall with face trauma.  Eyes: No visual changes. ENT: No sore throat. Cardiovascular: Denies chest pain. Respiratory: Denies shortness of breath. Gastrointestinal: No abdominal pain.  No nausea,  no vomiting.  No diarrhea.  No constipation. Genitourinary: Negative for dysuria. Musculoskeletal: Negative for back pain. Skin: Negative for rash. Positive upper lip laceration.  Neurological: Negative for headaches, focal weakness or numbness.  10-point ROS otherwise negative.  ____________________________________________   PHYSICAL EXAM:  VITAL SIGNS: ED Triage Vitals  Enc Vitals Group     BP 02/18/17 1622 133/72     Pulse Rate 02/18/17 1622 70      Resp 02/18/17 1622 18     Temp 02/18/17 1622 97.9 F (36.6 C)     Temp Source 02/18/17 1622 Oral     SpO2 02/18/17 1622 100 %     Weight 02/18/17 1620 102 lb (46.3 kg)     Height 02/18/17 1620 5\' 2"  (1.575 m)     Pain Score 02/18/17 1622 4   Constitutional: Alert and oriented. Well appearing and in no acute distress. Eyes: Conjunctivae are normal. PERRL. EOMI. Head: Atraumatic. Nose: No congestion/rhinnorhea. No septal hematoma.  Mouth/Throat: Mucous membranes are moist. 1 cm laceration to the mucosal surface of the upper lip with some tissue hanging down. Oropharynx non-erythematous. Neck: No stridor.  No cervical spine tenderness to palpation. Cardiovascular: Normal rate, regular rhythm. Good peripheral circulation. Grossly normal heart sounds.   Respiratory: Normal respiratory effort.  No retractions. Lungs CTAB. Gastrointestinal: Soft and nontender. No distention.  Musculoskeletal: No lower extremity tenderness nor edema. No gross deformities of extremities. Full ROM of the bilateral hips. Normal ROM of the left wrist. Limited ROM of the left hand with some overlying bruising.  Neurologic:  Normal speech and language. No gross focal neurologic deficits are appreciated.  Skin:  Skin is warm, dry and intact. Some bruising noted over the left hand with limited ROM there.    ____________________________________________  RADIOLOGY  Ct Head Wo Contrast  Result Date: 02/18/2017 CLINICAL DATA:  Reports of multiple falls after tripping at home. Las laceration noted above left eye, right side of lip and bruising noted to left side of face.; no loc; EXAM: CT HEAD WITHOUT CONTRAST CT MAXILLOFACIAL WITHOUT CONTRAST TECHNIQUE: Multidetector CT imaging of the head and maxillofacial structures were performed using the standard protocol without intravenous contrast. Multiplanar CT image reconstructions of the maxillofacial structures were also generated. COMPARISON:  10/15/2016 and previous FINDINGS:  CT HEAD FINDINGS Brain: Mild atrophy. No evidence of acute infarction, hemorrhage, hydrocephalus, extra-axial collection or mass lesion/mass effect. Vascular: Atherosclerotic and physiologic intracranial calcifications. Skull: Normal. Negative for fracture or focal lesion. Other: None CT MAXILLOFACIAL FINDINGS Osseous: No fracture or mandibular dislocation. No destructive process. Dental restorations. Orbits: Negative. No traumatic or inflammatory finding. Sinuses: Clear. Soft tissues: Negative. IMPRESSION: 1. Mild atrophy without bleed or other acute intracranial process. 2. Negative maxillofacial CT. Electronically Signed   By: Corlis Leak  Hassell M.D.   On: 02/18/2017 21:33   Dg Hand Complete Left  Result Date: 02/18/2017 CLINICAL DATA:  Tripped at home.  Left hand is red and bruised. EXAM: LEFT HAND - COMPLETE 3+ VIEW COMPARISON:  07/03/2016 FINDINGS: Old fractures of the fifth and fourth metacarpal bones. Old fracture of the middle finger proximal phalanx near the base. Negative for an acute fracture or dislocation. Interphalangeal joint space narrowing and osteophytosis. Findings are compatible with osteoarthritis. Wrist is located without a fracture. IMPRESSION: Old fractures as described.  No acute fracture. Osteoarthritis. Electronically Signed   By: Richarda OverlieAdam  Henn M.D.   On: 02/18/2017 16:46   Ct Maxillofacial Wo Contrast  Result Date: 02/18/2017 CLINICAL DATA:  Reports of multiple  falls after tripping at home. Las laceration noted above left eye, right side of lip and bruising noted to left side of face.; no loc; EXAM: CT HEAD WITHOUT CONTRAST CT MAXILLOFACIAL WITHOUT CONTRAST TECHNIQUE: Multidetector CT imaging of the head and maxillofacial structures were performed using the standard protocol without intravenous contrast. Multiplanar CT image reconstructions of the maxillofacial structures were also generated. COMPARISON:  10/15/2016 and previous FINDINGS: CT HEAD FINDINGS Brain: Mild atrophy. No evidence of  acute infarction, hemorrhage, hydrocephalus, extra-axial collection or mass lesion/mass effect. Vascular: Atherosclerotic and physiologic intracranial calcifications. Skull: Normal. Negative for fracture or focal lesion. Other: None CT MAXILLOFACIAL FINDINGS Osseous: No fracture or mandibular dislocation. No destructive process. Dental restorations. Orbits: Negative. No traumatic or inflammatory finding. Sinuses: Clear. Soft tissues: Negative. IMPRESSION: 1. Mild atrophy without bleed or other acute intracranial process. 2. Negative maxillofacial CT. Electronically Signed   By: Corlis Leak M.D.   On: 02/18/2017 21:33    ____________________________________________   PROCEDURES  Procedure(s) performed:   Marland KitchenMarland KitchenLaceration Repair Date/Time: 02/18/2017 9:04 PM Performed by: Adryan Shin, Arlyss Repress Authorized by: Maia Plan   Consent:    Consent obtained:  Verbal   Consent given by:  Patient   Risks discussed:  Infection, pain, vascular damage, retained foreign body, poor cosmetic result, poor wound healing, need for additional repair and nerve damage   Alternatives discussed:  No treatment Anesthesia (see MAR for exact dosages):    Anesthesia method:  Local infiltration   Local anesthetic:  Lidocaine 1% w/o epi Laceration details:    Location:  Mouth   Mouth location:  R buccal mucosa   Length (cm):  2   Depth (mm):  4 Repair type:    Repair type:  Simple Pre-procedure details:    Preparation:  Patient was prepped and draped in usual sterile fashion Exploration:    Hemostasis achieved with:  Direct pressure   Wound exploration: entire depth of wound probed and visualized     Wound extent: no foreign bodies/material noted, no muscle damage noted, no nerve damage noted and no underlying fracture noted     Contaminated: no   Treatment:    Area cleansed with:  Saline   Amount of cleaning:  Standard   Visualized foreign bodies/material removed: no   Skin repair:    Repair method:  Sutures    Suture size:  3-0   Suture material:  Fast-absorbing gut   Suture technique:  Simple interrupted   Number of sutures:  3 Approximation:    Approximation:  Close   Vermilion border: well-aligned   Post-procedure details:    Dressing:  Open (no dressing)   Patient tolerance of procedure:  Tolerated well, no immediate complications   ____________________________________________   INITIAL IMPRESSION / ASSESSMENT AND PLAN / ED COURSE  Pertinent labs & imaging results that were available during my care of the patient were reviewed by me and considered in my medical decision making (see chart for details).  Patient resents to the emergency room in for evaluation after mechanical fall. She has Parkinson's disease and history of frequent falling. He reports to me that she now has someone helping her at home. She'll see her daughter who is wheelchair bound. The patient has an abrasion above the right lip. She also has a 2 cm mucosal surface laceration just on the inside of the upper lip. There is some tissue hanging down. Plan for cleanout and absorbable suturing to tack down this tissue to give a better chance of  normal healing.   09:06 PM Lip laceration repaired. Absorbable suture used. Patient reports prior conversations regarding assisted living but she is not ready to sell her house and move. Will continue conversation with PCP. CT head pending.   09:40 PM No acute findings on CT of the head or max face. Friend at bedside to drive the patient home. Encouraged her to follow up with primary care physician regarding additional assistance at home and consider further assisted living.   At this time, I do not feel there is any life-threatening condition present. I have reviewed and discussed all results (EKG, imaging, lab, urine as appropriate), exam findings with patient. I have reviewed nursing notes and appropriate previous records.  I feel the patient is safe to be discharged home without  further emergent workup. Discussed usual and customary return precautions. Patient and family (if present) verbalize understanding and are comfortable with this plan.  Patient will follow-up with their primary care provider. If they do not have a primary care provider, information for follow-up has been provided to them. All questions have been answered.  ____________________________________________  FINAL CLINICAL IMPRESSION(S) / ED DIAGNOSES  Final diagnoses:  Fall, initial encounter  Contusion of face, initial encounter  Injury of head, initial encounter  Lip laceration, initial encounter     MEDICATIONS GIVEN DURING THIS VISIT:  Medications  lidocaine (PF) (XYLOCAINE) 1 % injection 5 mL (not administered)  povidone-iodine (BETADINE) 10 % external solution (not administered)     NEW OUTPATIENT MEDICATIONS STARTED DURING THIS VISIT:  None   Note:  This document was prepared using Dragon voice recognition software and may include unintentional dictation errors.  Alona Bene, MD Emergency Medicine    Lavene Penagos, Arlyss Repress, MD 02/18/17 [phone]

## 2017-02-19 DIAGNOSIS — Z681 Body mass index (BMI) 19 or less, adult: Secondary | ICD-10-CM | POA: Diagnosis not present

## 2017-02-19 DIAGNOSIS — T148XXD Other injury of unspecified body region, subsequent encounter: Secondary | ICD-10-CM | POA: Diagnosis not present

## 2017-02-19 DIAGNOSIS — W19XXXD Unspecified fall, subsequent encounter: Secondary | ICD-10-CM | POA: Diagnosis not present

## 2017-02-19 DIAGNOSIS — Z1389 Encounter for screening for other disorder: Secondary | ICD-10-CM | POA: Diagnosis not present

## 2017-03-08 ENCOUNTER — Emergency Department (HOSPITAL_COMMUNITY)
Admission: EM | Admit: 2017-03-08 | Discharge: 2017-03-08 | Disposition: A | Discharge status: Home / Self Care | Payer: Medicare HMO | Attending: Emergency Medicine | Admitting: Emergency Medicine

## 2017-03-08 ENCOUNTER — Emergency Department (HOSPITAL_COMMUNITY): Payer: Medicare HMO

## 2017-03-08 ENCOUNTER — Encounter (HOSPITAL_COMMUNITY): Payer: Self-pay

## 2017-03-08 DIAGNOSIS — G20 Parkinson's disease: Secondary | ICD-10-CM | POA: Diagnosis not present

## 2017-03-08 DIAGNOSIS — S0191XA Laceration without foreign body of unspecified part of head, initial encounter: Secondary | ICD-10-CM | POA: Insufficient documentation

## 2017-03-08 DIAGNOSIS — S199XXA Unspecified injury of neck, initial encounter: Secondary | ICD-10-CM | POA: Diagnosis not present

## 2017-03-08 DIAGNOSIS — S098XXA Other specified injuries of head, initial encounter: Secondary | ICD-10-CM | POA: Diagnosis not present

## 2017-03-08 DIAGNOSIS — W19XXXA Unspecified fall, initial encounter: Secondary | ICD-10-CM | POA: Diagnosis not present

## 2017-03-08 DIAGNOSIS — S0190XA Unspecified open wound of unspecified part of head, initial encounter: Secondary | ICD-10-CM | POA: Diagnosis not present

## 2017-03-08 DIAGNOSIS — Y999 Unspecified external cause status: Secondary | ICD-10-CM | POA: Insufficient documentation

## 2017-03-08 DIAGNOSIS — Z79899 Other long term (current) drug therapy: Secondary | ICD-10-CM | POA: Diagnosis not present

## 2017-03-08 DIAGNOSIS — M542 Cervicalgia: Secondary | ICD-10-CM | POA: Diagnosis not present

## 2017-03-08 DIAGNOSIS — S0990XA Unspecified injury of head, initial encounter: Secondary | ICD-10-CM | POA: Diagnosis not present

## 2017-03-08 DIAGNOSIS — Y939 Activity, unspecified: Secondary | ICD-10-CM | POA: Insufficient documentation

## 2017-03-08 DIAGNOSIS — R51 Headache: Secondary | ICD-10-CM | POA: Diagnosis not present

## 2017-03-08 DIAGNOSIS — I10 Essential (primary) hypertension: Secondary | ICD-10-CM | POA: Diagnosis not present

## 2017-03-08 DIAGNOSIS — Y92009 Unspecified place in unspecified non-institutional (private) residence as the place of occurrence of the external cause: Secondary | ICD-10-CM | POA: Diagnosis not present

## 2017-03-08 DIAGNOSIS — S0101XA Laceration without foreign body of scalp, initial encounter: Secondary | ICD-10-CM | POA: Diagnosis not present

## 2017-03-08 NOTE — ED Notes (Addendum)
Victorino DikeJennifer, Daughter   [phone] [place], neighbor  [phone] Per neighbor, [place], pt not usually confused.

## 2017-03-08 NOTE — ED Notes (Signed)
Pt taken to ct 

## 2017-03-08 NOTE — ED Notes (Signed)
Daughter, Victorino DikeJennifer called and notified that pt is ready for discharge.

## 2017-03-08 NOTE — ED Provider Notes (Signed)
[facility] DEPT Provider Note   CSN: [id number] Arrival date & time: 03/08/17  1556     History   Chief Complaint Chief Complaint  Patient presents with  . Fall  . Headache    HPI Wendy Reynolds is a 76 y.o. female.  Patient fell and hit the back of her head. No loss of consciousness.   The history is provided by the patient.  Fall  This is a new problem. The current episode started 3 to 5 hours ago. The problem occurs rarely. The problem has been resolved. Associated symptoms include headaches. Pertinent negatives include no chest pain and no abdominal pain. Nothing aggravates the symptoms. Nothing relieves the symptoms.  Headache      Past Medical History:  Diagnosis Date  . Alcohol use   . Chest pain 03/04/2012   MI ruled out. Likely anxiety related.  . Depression   . Diastolic dysfunction 03/05/2012   Grade 1. EF 60%  . Falls   . Generalized anxiety disorder 03/04/2012  . Hyperlipidemia 03/05/2012  . Hypertension   . Hypothyroid   . Panic   . Parkinson disease (HCC)   . Parkinson's disease [facility])     Patient Active Problem List   Diagnosis Date Noted  . UTI (lower urinary tract infection) 09/15/2014  . Acute kidney injury (HCC) 09/15/2014  . Acute encephalopathy 09/15/2014  . Orthostatic hypotension 09/15/2014  . Hypoglycemia 09/15/2014  . Frequent falls   . Renal insufficiency 09/14/2014  . Parkinson's disease (tremor, stiffness, slow motion, unstable posture) (HCC) 05/15/2013  . 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 Diastolic dysfunction 03/05/2012  . Hyperlipidemia 03/05/2012  . Chest pain 03/04/2012  . Generalized anxiety disorder 03/04/2012  . HTN (hypertension) 03/04/2012  . Depression 03/04/2012    Past Surgical History:  Procedure Laterality Date  . DILATION AND CURETTAGE OF UTERUS      OB History    Gravida Para Term Preterm AB Living   2         2   SAB TAB Ectopic Multiple Live Births           Home Medications    Prior to Admission medications   Medication Sig Start Date End Date Taking? Authorizing Provider  carbidopa-levodopa (SINEMET IR) 25-100 MG tablet Take 1 tablet by mouth 3 (three) times daily.    Yes [provider]  cholecalciferol (VITAMIN D) 1000 units tablet Take 1,000 Units by mouth daily.   Yes [provider]  clonazePAM (KLONOPIN) 1 MG tablet Take 1-1.5 mg by mouth daily as needed for anxiety.    Yes [provider]  DULoxetine (CYMBALTA) 60 MG capsule Take 1 capsule (60 mg total) by mouth daily. 09/06/15  Yes Myrlene Broker, MD  hydrOXYzine (ATARAX/VISTARIL) 50 MG tablet Take 50-100 mg by mouth at bedtime as needed for anxiety (for sleep).    Yes [provider]  levothyroxine (SYNTHROID, LEVOTHROID) 50 MCG tablet Take 50 mcg by mouth daily before breakfast.    Yes [provider]  lisinopril (PRINIVIL,ZESTRIL) 5 MG tablet Take 5 mg by mouth daily.  03/02/15  Yes [provider]  Multiple Vitamin (MULTIVITAMIN WITH MINERALS) TABS tablet Take 1 tablet by mouth daily.   Yes [provider]  Omega-3 Fatty Acids (FISH OIL) 1000 MG CAPS Take 1 capsule by mouth daily.   Yes [provider]    Family History Family History  Problem Relation Age of Onset  .  Depression Maternal Grandfather   . Depression Cousin   . Alcohol abuse Cousin   . Depression Daughter   . Alcohol abuse Mother   . Alcohol abuse Father   . Alcohol abuse Maternal Aunt     Social History Social History  Substance Use Topics  . Smoking status: Never Smoker  . Smokeless tobacco: Never Used  . Alcohol use Yes     Comment: 2 glasses a night     Allergies   Erythromycin and Penicillins   Review of Systems Review of Systems  Constitutional: Negative for appetite change and fatigue.  HENT: Negative for congestion, ear discharge and sinus pressure.   Eyes: Negative for discharge.  Respiratory: Negative for  cough.   Cardiovascular: Negative for chest pain.  Gastrointestinal: Negative for abdominal pain and diarrhea.  Genitourinary: Negative for frequency and hematuria.  Musculoskeletal: Negative for back pain.  Skin: Negative for rash.  Neurological: Positive for headaches. Negative for seizures.  Psychiatric/Behavioral: Negative for hallucinations.     Physical Exam Updated Vital Signs BP 123/73 (BP Location: Left Arm)   Pulse 69   Temp 98.4 F (36.9 C) (Oral)   Resp 18   Ht 5\' 2"  (1.575 m)   Wt 44.5 kg (98 lb)   SpO2 100%   BMI 17.92 kg/m   Physical Exam  Constitutional: She is oriented to person, place, and time. She appears well-developed.  HENT:  Head: Normocephalic.  Patient has a 1.5 cm laceration to the occipital head  Eyes: Conjunctivae and EOM are normal. No scleral icterus.  Neck: Neck supple. No thyromegaly present.  Cardiovascular: Normal rate and regular rhythm.  Exam reveals no gallop and no friction rub.   No murmur heard. Pulmonary/Chest: No stridor. She has no wheezes. She has no rales. She exhibits no tenderness.  Abdominal: She exhibits no distension. There is no tenderness. There is no rebound.  Musculoskeletal: Normal range of motion. She exhibits no edema.  Lymphadenopathy:    She has no cervical adenopathy.  Neurological: She is oriented to person, place, and time. She exhibits normal muscle tone. Coordination normal.  Skin: No rash noted. No erythema.  Psychiatric: She has a normal mood and affect. Her behavior is normal.     ED Treatments / Results  Labs (all labs ordered are listed, but only abnormal results are displayed) Labs Reviewed - No data to display  EKG  EKG Interpretation None       Radiology Ct Head Wo Contrast  Result Date: 03/08/2017 CLINICAL DATA:  Patient fell down stairs today with headache and neck pain. EXAM: CT HEAD WITHOUT CONTRAST CT CERVICAL SPINE WITHOUT CONTRAST TECHNIQUE: Multidetector CT imaging of the head  and cervical spine was performed following the standard protocol without intravenous contrast. Multiplanar CT image reconstructions of the cervical spine were also generated. COMPARISON:  03/08/2017 head CT, C-spine radiographs 10/15/2016 and C-spine CT 340 18 FINDINGS: CT HEAD FINDINGS Brain: There is mild superficial stable atrophy with sulcal prominence. No evidence of acute infarction, hemorrhage, hydrocephalus or midline shift. No intra-axial mass nor extra-axial fluid collections. Vascular: No hyperdense vessels are identified. Atherosclerosis at the skullbase along the cavernous sinus carotids. Skull: Negative for acute fracture.  No suspicious osseous lesions. Sinuses/Orbits: The mastoids, included paranasal sinuses, orbits and globes are nonacute. Other: Mild soft tissue swelling of the posterior parietal scalp. CT CERVICAL SPINE FINDINGS Alignment: Normal. Skull base and vertebrae: No acute fracture. No primary bone lesion or focal pathologic process. Soft tissues and spinal canal:  No prevertebral fluid or swelling. No visible canal hematoma. Disc levels: The cervical disc levels are maintained. Tiny posterior marginal osteophytes at C4-5. No significant central or neural foraminal encroachment. No jumped or perched facets. Upper chest: Negative. Other: None IMPRESSION: 1. No acute intracranial abnormality.  Mild cerebral atrophy. 2. Mild scalp contusions along the posterior parietal scalp bilaterally. 3. No acute cervical spine fracture or subluxation. Electronically Signed   By: Tollie Ethavid  Kwon M.D.   On: 03/08/2017 18:14   Ct Cervical Spine Wo Contrast  Result Date: 03/08/2017 CLINICAL DATA:  Patient fell down stairs today with headache and neck pain. EXAM: CT HEAD WITHOUT CONTRAST CT CERVICAL SPINE WITHOUT CONTRAST TECHNIQUE: Multidetector CT imaging of the head and cervical spine was performed following the standard protocol without intravenous contrast. Multiplanar CT image reconstructions of the  cervical spine were also generated. COMPARISON:  03/08/2017 head CT, C-spine radiographs 10/15/2016 and C-spine CT 340 18 FINDINGS: CT HEAD FINDINGS Brain: There is mild superficial stable atrophy with sulcal prominence. No evidence of acute infarction, hemorrhage, hydrocephalus or midline shift. No intra-axial mass nor extra-axial fluid collections. Vascular: No hyperdense vessels are identified. Atherosclerosis at the skullbase along the cavernous sinus carotids. Skull: Negative for acute fracture.  No suspicious osseous lesions. Sinuses/Orbits: The mastoids, included paranasal sinuses, orbits and globes are nonacute. Other: Mild soft tissue swelling of the posterior parietal scalp. CT CERVICAL SPINE FINDINGS Alignment: Normal. Skull base and vertebrae: No acute fracture. No primary bone lesion or focal pathologic process. Soft tissues and spinal canal: No prevertebral fluid or swelling. No visible canal hematoma. Disc levels: The cervical disc levels are maintained. Tiny posterior marginal osteophytes at C4-5. No significant central or neural foraminal encroachment. No jumped or perched facets. Upper chest: Negative. Other: None IMPRESSION: 1. No acute intracranial abnormality.  Mild cerebral atrophy. 2. Mild scalp contusions along the posterior parietal scalp bilaterally. 3. No acute cervical spine fracture or subluxation. Electronically Signed   By: Tollie Ethavid  Kwon M.D.   On: 03/08/2017 18:14    Procedures .Marland Kitchen.Laceration Repair Date/Time: 03/08/2017 7:34 PM Performed by: Bethann BerkshireZAMMIT, Win Guajardo Authorized by: Bethann BerkshireZAMMIT, Aliou Mealey   Comments:     Patient with a 1.5 superficial laceration to occipital head. Area was cleaned thoroughly with Betadine. Nothing was used to anesthetize the area. 3 staples were used to close laceration. Patient tolerated the procedure well   (including critical care time)  Medications Ordered in ED Medications - No data to display   Initial Impression / Assessment and Plan / ED Course  I  have reviewed the triage vital signs and the nursing notes.  Pertinent labs & imaging results that were available during my care of the patient were reviewed by me and considered in my medical decision making (see chart for details).     Patient had a fall with head injury that resulted in laceration to her occipital area. 1.5 cm laceration. Patient had laceration closed with staples  Final Clinical Impressions(s) / ED Diagnoses   Final diagnoses:  Fall in home, initial encounter    New Prescriptions New Prescriptions   No medications on file     Bethann BerkshireZammit, Miracle Mongillo, MD 03/08/17 Barry Brunner1935

## 2017-03-08 NOTE — Discharge Instructions (Signed)
Wash her hair daily. Have staples removed in one week

## 2017-03-08 NOTE — ED Triage Notes (Addendum)
Pt found at the bottom of steps. Pt unsure how many steps she fell down. She hit the back of her head. Denies loss of consciousness.Pt had gone downstairs to change filter and per EMS filter had been changed. Pt is alert and answering appropriately. C Collar in place. Pt unsure what caused her to fall, but states she falls frequently

## 2017-03-09 ENCOUNTER — Encounter (HOSPITAL_COMMUNITY): Payer: Self-pay | Admitting: Emergency Medicine

## 2017-03-09 ENCOUNTER — Emergency Department (HOSPITAL_COMMUNITY)
Admission: EM | Admit: 2017-03-09 | Discharge: 2017-03-09 | Disposition: A | Discharge status: Home / Self Care | Payer: Medicare HMO | Attending: Emergency Medicine | Admitting: Emergency Medicine

## 2017-03-09 ENCOUNTER — Emergency Department (HOSPITAL_COMMUNITY): Payer: Medicare HMO

## 2017-03-09 DIAGNOSIS — Y9301 Activity, walking, marching and hiking: Secondary | ICD-10-CM | POA: Diagnosis not present

## 2017-03-09 DIAGNOSIS — S0100XA Unspecified open wound of scalp, initial encounter: Secondary | ICD-10-CM | POA: Diagnosis present

## 2017-03-09 DIAGNOSIS — Z79899 Other long term (current) drug therapy: Secondary | ICD-10-CM | POA: Insufficient documentation

## 2017-03-09 DIAGNOSIS — I10 Essential (primary) hypertension: Secondary | ICD-10-CM | POA: Diagnosis not present

## 2017-03-09 DIAGNOSIS — S0101XA Laceration without foreign body of scalp, initial encounter: Secondary | ICD-10-CM | POA: Diagnosis not present

## 2017-03-09 DIAGNOSIS — W01198A Fall on same level from slipping, tripping and stumbling with subsequent striking against other object, initial encounter: Secondary | ICD-10-CM | POA: Insufficient documentation

## 2017-03-09 DIAGNOSIS — Y92009 Unspecified place in unspecified non-institutional (private) residence as the place of occurrence of the external cause: Secondary | ICD-10-CM | POA: Diagnosis not present

## 2017-03-09 DIAGNOSIS — E039 Hypothyroidism, unspecified: Secondary | ICD-10-CM | POA: Diagnosis not present

## 2017-03-09 DIAGNOSIS — S0990XA Unspecified injury of head, initial encounter: Secondary | ICD-10-CM | POA: Diagnosis not present

## 2017-03-09 DIAGNOSIS — W19XXXA Unspecified fall, initial encounter: Secondary | ICD-10-CM | POA: Diagnosis not present

## 2017-03-09 DIAGNOSIS — Y998 Other external cause status: Secondary | ICD-10-CM | POA: Diagnosis not present

## 2017-03-09 MED ORDER — IBUPROFEN 400 MG PO TABS: 400.0000 mg | ORAL_TABLET | Freq: Four times a day (QID) | ORAL | 0 refills | Status: AC | PRN

## 2017-03-09 NOTE — Discharge Instructions (Signed)
You will have bruising and swelling which will continue for a week or so.  The staples should come out in next 10 days at your doctors office  Your family should be able to take care of this for you.  Return to the ER for increased pain, vomiting, seizures or blurred vision.

## 2017-03-09 NOTE — ED Notes (Signed)
Patient transported to CT 

## 2017-03-09 NOTE — ED Notes (Signed)
Pt returned to room  

## 2017-03-09 NOTE — ED Notes (Signed)
Pt's ride arrived 

## 2017-03-09 NOTE — ED Provider Notes (Signed)
[facility] DE[facility] Provider Note   CSN: [id number] Arrival date & time: 03/09/17  1939     History   Chief Complaint Chief Complaint  Patient presents with  . Fall    HPI Wendy Reynolds is a 76 y.o. female.  HPI  The pt is a 76 y/o female - she has a hx of Parkinson's disease and she walks with a walker - she had a fall yesterday and was seen in hospital, required staples to the posterior R scalp - she was doing well until she was walking "without her walker" again this evening - bent over to try to clean up a dead baby bat when she lost her balance and fell again striking the back of her head -  She had more bleeding - and decided to drive herself to the ER - has mild headache - no vomiting or seizures or c/o n umbness, weakness or blurred vision.  This occurred just prior to arrival.    Past Medical History:  Diagnosis Date  . Alcohol use   . Chest pain 03/04/2012   MI ruled out. Likely anxiety related.  . Depression   . Diastolic dysfunction 03/05/2012   Grade 1. EF 60%  . Falls   . Generalized anxiety disorder 03/04/2012  . Hyperlipidemia 03/05/2012  . Hypertension   . Hypothyroid   . Panic   . Parkinson disease (HCC)   . Parkinson's disease [facility])     Patient Active Problem List   Diagnosis Date Noted  . UTI (lower urinary tract infection) 09/15/2014  . Acute kidney injury (HCC) 09/15/2014  . Acute encephalopathy 09/15/2014  . Orthostatic hypotension 09/15/2014  . Hypoglycemia 09/15/2014  . Frequent falls   . Renal insufficiency 09/14/2014  . Parkinson's disease (tremor, stiffness, slow motion, unstable posture) (HCC) 05/15/2013  . 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 Diastolic dysfunction 03/05/2012  . Hyperlipidemia 03/05/2012  . Chest pain 03/04/2012  . Generalized anxiety disorder 03/04/2012  . HTN (hypertension) 03/04/2012  . Depression 03/04/2012    Past Surgical History:  Procedure Laterality Date  . DILATION AND  CURETTAGE OF UTERUS      OB History    Gravida Para Term Preterm AB Living   2         2   SAB TAB Ectopic Multiple Live Births                   Home Medications    Prior to Admission medications   Medication Sig Start Date End Date Taking? Authorizing Provider  carbidopa-levodopa (SINEMET IR) 25-100 MG tablet Take 1 tablet by mouth 3 (three) times daily.     [provider]  cholecalciferol (VITAMIN D) 1000 units tablet Take 1,000 Units by mouth daily.    [provider]  clonazePAM (KLONOPIN) 1 MG tablet Take 1-1.5 mg by mouth daily as needed for anxiety.     [provider]  DULoxetine (CYMBALTA) 60 MG capsule Take 1 capsule (60 mg total) by mouth daily. 09/06/15   Myrlene Broker, MD  hydrOXYzine (ATARAX/VISTARIL) 50 MG tablet Take 50-100 mg by mouth at bedtime as needed for anxiety (for sleep).     [provider]  levothyroxine (SYNTHROID, LEVOTHROID) 50 MCG tablet Take 50 mcg by mouth daily before breakfast.     [provider]  lisinopril (PRINIVIL,ZESTRIL) 5 MG tablet Take 5 mg by mouth daily.  03/02/15   [provider]  Multiple  Vitamin (MULTIVITAMIN WITH MINERALS) TABS tablet Take 1 tablet by mouth daily.    [provider]  Omega-3 Fatty Acids (FISH OIL) 1000 MG CAPS Take 1 capsule by mouth daily.    [provider]    Family History Family History  Problem Relation Age of Onset  . Depression Maternal Grandfather   . Depression Cousin   . Alcohol abuse Cousin   . Depression Daughter   . Alcohol abuse Mother   . Alcohol abuse Father   . Alcohol abuse Maternal Aunt     Social History Social History  Substance Use Topics  . Smoking status: Never Smoker  . Smokeless tobacco: Never Used  . Alcohol use Yes     Comment: 2 glasses a night     Allergies   Erythromycin and Penicillins   Review of Systems Review of Systems  All other systems reviewed and are negative.    Physical  Exam Updated Vital Signs BP 132/89   Pulse 75   Temp 97.9 F (36.6 C) (Oral)   Resp 16   SpO2 98%   Physical Exam  Constitutional: She appears well-developed and well-nourished. No distress.  HENT:  Head: Normocephalic.  Mouth/Throat: Oropharynx is clear and moist. No oropharyngeal exudate.  Current staples are intact - new laceration to the lateral aspect of the current wound - 1.5 cm in length, oozing blood.    Eyes: Pupils are equal, round, and reactive to light. Conjunctivae and EOM are normal. Right eye exhibits no discharge. Left eye exhibits no discharge. No scleral icterus.  Neck: Normal range of motion. Neck supple. No JVD present. No thyromegaly present.  Cardiovascular: Normal rate, regular rhythm, normal heart sounds and intact distal pulses.  Exam reveals no gallop and no friction rub.   No murmur heard. Pulmonary/Chest: Effort normal and breath sounds normal. No respiratory distress. She has no wheezes. She has no rales.  Abdominal: Soft. Bowel sounds are normal. She exhibits no distension and no mass. There is no tenderness.  Musculoskeletal: Normal range of motion. She exhibits no edema or tenderness.  Lymphadenopathy:    She has no cervical adenopathy.  Neurological: She is alert. Coordination normal.  Skin: Skin is warm and dry. No rash noted. No erythema.  Laceration as described above  Psychiatric: She has a normal mood and affect. Her behavior is normal.  Nursing note and vitals reviewed.    ED Treatments / Results  Labs (all labs ordered are listed, but only abnormal results are displayed) Labs Reviewed - No data to display  Radiology Ct Head Wo Contrast  Result Date: 03/08/2017 CLINICAL DATA:  Patient fell down stairs today with headache and neck pain. EXAM: CT HEAD WITHOUT CONTRAST CT CERVICAL SPINE WITHOUT CONTRAST TECHNIQUE: Multidetector CT imaging of the head and cervical spine was performed following the standard protocol without intravenous  contrast. Multiplanar CT image reconstructions of the cervical spine were also generated. COMPARISON:  03/08/2017 head CT, C-spine radiographs 10/15/2016 and C-spine CT 340 18 FINDINGS: CT HEAD FINDINGS Brain: There is mild superficial stable atrophy with sulcal prominence. No evidence of acute infarction, hemorrhage, hydrocephalus or midline shift. No intra-axial mass nor extra-axial fluid collections. Vascular: No hyperdense vessels are identified. Atherosclerosis at the skullbase along the cavernous sinus carotids. Skull: Negative for acute fracture.  No suspicious osseous lesions. Sinuses/Orbits: The mastoids, included paranasal sinuses, orbits and globes are nonacute. Other: Mild soft tissue swelling of the posterior parietal scalp. CT CERVICAL SPINE FINDINGS Alignment: Normal. Skull base and  vertebrae: No acute fracture. No primary bone lesion or focal pathologic process. Soft tissues and spinal canal: No prevertebral fluid or swelling. No visible canal hematoma. Disc levels: The cervical disc levels are maintained. Tiny posterior marginal osteophytes at C4-5. No significant central or neural foraminal encroachment. No jumped or perched facets. Upper chest: Negative. Other: None IMPRESSION: 1. No acute intracranial abnormality.  Mild cerebral atrophy. 2. Mild scalp contusions along the posterior parietal scalp bilaterally. 3. No acute cervical spine fracture or subluxation. Electronically Signed   By: Tollie Eth M.D.   On: 03/08/2017 18:14   Ct Cervical Spine Wo Contrast  Result Date: 03/08/2017 CLINICAL DATA:  Patient fell down stairs today with headache and neck pain. EXAM: CT HEAD WITHOUT CONTRAST CT CERVICAL SPINE WITHOUT CONTRAST TECHNIQUE: Multidetector CT imaging of the head and cervical spine was performed following the standard protocol without intravenous contrast. Multiplanar CT image reconstructions of the cervical spine were also generated. COMPARISON:  03/08/2017 head CT, C-spine radiographs  10/15/2016 and C-spine CT 340 18 FINDINGS: CT HEAD FINDINGS Brain: There is mild superficial stable atrophy with sulcal prominence. No evidence of acute infarction, hemorrhage, hydrocephalus or midline shift. No intra-axial mass nor extra-axial fluid collections. Vascular: No hyperdense vessels are identified. Atherosclerosis at the skullbase along the cavernous sinus carotids. Skull: Negative for acute fracture.  No suspicious osseous lesions. Sinuses/Orbits: The mastoids, included paranasal sinuses, orbits and globes are nonacute. Other: Mild soft tissue swelling of the posterior parietal scalp. CT CERVICAL SPINE FINDINGS Alignment: Normal. Skull base and vertebrae: No acute fracture. No primary bone lesion or focal pathologic process. Soft tissues and spinal canal: No prevertebral fluid or swelling. No visible canal hematoma. Disc levels: The cervical disc levels are maintained. Tiny posterior marginal osteophytes at C4-5. No significant central or neural foraminal encroachment. No jumped or perched facets. Upper chest: Negative. Other: None IMPRESSION: 1. No acute intracranial abnormality.  Mild cerebral atrophy. 2. Mild scalp contusions along the posterior parietal scalp bilaterally. 3. No acute cervical spine fracture or subluxation. Electronically Signed   By: Tollie Eth M.D.   On: 03/08/2017 18:14    Procedures Procedures (including critical care time)  LACERATION REPAIR Performed by: Vida Roller Authorized by: Vida Roller Consent: Verbal consent obtained. Risks and benefits: risks, benefits and alternatives were discussed Consent given by: patient Patient identity confirmed: provided demographic data Prepped and Draped in normal sterile fashion Wound explored  Laceration Location: scalp  Laceration Length: 1.5cm  No Foreign Bodies seen or palpated  Anesthesia: local infiltration  Local anesthetic: None  Anesthetic total: 0 ml  Irrigation method: syringe Amount of  cleaning: standard  Skin closure: staples  Number of sutures: 2  Technique: staples  Patient tolerance: Patient tolerated the procedure well with no immediate complications.   Medications Ordered in ED Medications - No data to display   Initial Impression / Assessment and Plan / ED Course  I have reviewed the triage vital signs and the nursing notes.  Pertinent labs & imaging results that were available during my care of the patient were reviewed by me and considered in my medical decision making (see chart for details).     The pt has had a second fall She has a laceration to the scalp - needs repair Irrigated, closed - imaging pending to evaluate for ICH. - dressing placed Pt agreeable - cautioned and counseled again on the use of her walker Expressed understanding  Ambulated wtihout difficulty with tech -  Pt will  call for ride.  Final Clinical Impressions(s) / ED Diagnoses   Final diagnoses:  Laceration of scalp, initial encounter  Fall in home, initial encounter    New Prescriptions New Prescriptions   No medications on file     Eber HongMiller, Makendra Vigeant, MD 03/09/17 [id number]

## 2017-03-09 NOTE — ED Triage Notes (Signed)
Pt fell out in the yard on grass today.  D/c from ed yesterday for fall, had staples to back of head.  Head is bleeding at staple sight.  Pt drove herself to hospital

## 2017-03-09 NOTE — ED Notes (Signed)
Patient is resting on stretcher at this time. Patient states that she wants to go home, but because its dark she needs someone to drive her. Explained to patient that we have to wait for her test results to come back and for the Doctor to come in to see her. Patient is talking with daughter on the phone at this time.

## 2017-03-09 NOTE — ED Notes (Signed)
Pt's daughter attempting to arrange for ride back home

## 2017-03-12 ENCOUNTER — Other Ambulatory Visit: Payer: Self-pay | Admitting: *Deleted

## 2017-03-12 DIAGNOSIS — R296 Repeated falls: Secondary | ICD-10-CM

## 2017-03-12 NOTE — Patient Outreach (Addendum)
[facility](THN) Care Management  03/12/2017  Wendy Fraiseamela H Reynolds 12/19/1940 [id number]   Referral Date: [id number] Referral Source:THN ED Census Referral Reason: 6 or more ED Visits in the past 6 months Insurance: [facility]  RN Health Coach telephone call to patient.  Hipaa compliance verified. This patient had recent falls on [id number] with second laceration to the head, and a fall on [id number]. This patient has had 11 falls within the year. Per patient stated that she fell because she was not using her walker as she is suppose to. Patient drives self to physician office. Per patient some of the medication she is on does make her a little sleepy. Patient drinks 2 glasses of alcohol at night. Patient lives with her daughter who is in a wheelchair. The daughter is her caregiver. Patient has agreed to community care manger coming to home and evaluating home needs. Pharmacy Patient stated she does not need any assistance with her medication. She is able to afford them. Per patient she understands her medication and does not have any questions. Per patient she takes her medications as prescribed. Social Worker Per patient she has no transportation needs. Patient drives herself. No needs for the social worker at this time.  Complex case manager Patient would like for a Technical brewerCommunity care manager to come out and do an assessment of fall needs and home needs Plan Referral to complex case manager for home and fall needs Welcome packet and letter sent  Advance Directive packet sent  Gean MaidensFrances Sullivan Jacuinde BSN RN [facility] Care Management [phone]

## 2017-03-13 ENCOUNTER — Other Ambulatory Visit: Payer: Self-pay | Admitting: *Deleted

## 2017-03-13 NOTE — Patient Outreach (Signed)
[facility](THN) Care Management  03/13/2017  Wendy Reynolds 11/26/1940 [id number]   Care coordination  [facility] on 03/12/17  03/13/17 [facility] called and left a HIPPA compliant voice message on the home number and one on the listed mobile number  Plans Pending return call from [name] will again attempt to engage for [facility]  home visit  [name] L. Noelle PennerGibbs, RN, BSN, CCM [facility] Care Management [phone]

## 2017-03-15 ENCOUNTER — Other Ambulatory Visit: Payer: Self-pay | Admitting: *Deleted

## 2017-03-15 NOTE — Patient Outreach (Signed)
[facility]) Care Management   03/15/2017  Wendy Reynolds 06/19/1941 [id number]  Wendy Reynolds is an 76 y.o. female with PMH of Frequent falls, rib fractures, Panic; Hypertension; Depression; Generalized anxiety disorder (03/04/2012); Alcohol use; Diastolic dysfunction (0/02/6760); Acute Kidney injury, UTI, orthostatic hypotension, Chest pain (03/04/2012); Hyperlipidemia (03/05/2012); and Parkinson disease  Wendy Reynolds was referred to [facility] CM on March 12 2017 from [facility] teleph[facility] nurse assessment indicating 11 falls within the past year. Most recent fall on 03/08/17 resulting in a 1.5 cm occipital laceration to scalp (back of her head) and on 03/09/17. Wendy Reynolds ambulates with a walker but confirms she doesn't use the walker as she should.  Wendy Reynolds had 6 ED visits (09/30/16; 10/15/16; 01/08/17; 02/18/17; 03/08/17; 03/09/17) all related to a fall or injury and no admissions in the last 6 months. Last hospitalization was 2 years ago (February 2016) leading to a snf stay at [facility] per EPIC  Subjective:   Objective:   BP 124/84   Pulse 66   Temp 98.7 F (37.1 C) (Oral)   Resp 20   Ht 1.575 m (5' 2" )   Wt 104 lb (47.2 kg)   SpO2 97%   BMI 19.02 kg/m   Review of Systems  HENT: Negative.   Eyes: Negative.   Respiratory: Negative.   Cardiovascular: Negative.   Gastrointestinal: Negative.   Genitourinary: Negative.   Musculoskeletal: Positive for falls.  Skin: Negative.   Neurological: Positive for tremors and weakness. Negative for tingling, sensory change, speech change, focal weakness, seizures and loss of consciousness.  Endo/Heme/Allergies: Bruises/bleeds easily.  Psychiatric/Behavioral: The patient is nervous/anxious.     Physical Exam  Constitutional: She is oriented to person, place, and time. She appears well-developed and well-nourished.  HENT:  Head: Normocephalic and atraumatic.  Eyes: Pupils are equal, round, and reactive to light. Conjunctivae are normal.   Neck: Normal range of motion. Neck supple.  Cardiovascular: Normal rate, regular rhythm, normal heart sounds and intact distal pulses.   Respiratory: Effort normal and breath sounds normal.  GI: Soft. Bowel sounds are normal.  Musculoskeletal: Normal range of motion.  Neurological: She is alert and oriented to person, place, and time.  Skin: Skin is warm and dry.  Psychiatric: She has a normal mood and affect. Her behavior is normal. Judgment and thought content normal.    Encounter Medications:   Outpatient Encounter Prescriptions as of 03/15/2017  Medication Sig Note  . carbidopa-levodopa (SINEMET IR) 25-100 MG tablet Take 1 tablet by mouth 3 (three) times daily.    . cholecalciferol (VITAMIN D) 1000 units tablet Take 1,000 Units by mouth daily.   . clonazePAM (KLONOPIN) 1 MG tablet Take 1-1.5 mg by mouth daily as needed for anxiety.    . DULoxetine (CYMBALTA) 60 MG capsule Take 1 capsule (60 mg total) by mouth daily.   . hydrOXYzine (ATARAX/VISTARIL) 50 MG tablet Take 50-100 mg by mouth at bedtime as needed for anxiety (for sleep).  03/08/2017: Prescribed one tablet up to 4 times daily as needed  . ibuprofen (ADVIL,MOTRIN) 400 MG tablet Take 1 tablet (400 mg total) by mouth every 6 (six) hours as needed.   Marland Kitchen levothyroxine (SYNTHROID, LEVOTHROID) 50 MCG tablet Take 50 mcg by mouth daily before breakfast.    . lisinopril (PRINIVIL,ZESTRIL) 5 MG tablet Take 5 mg by mouth daily.    . Multiple Vitamin (MULTIVITAMIN WITH MINERALS) TABS tablet Take 1 tablet by mouth daily.   . Omega-3 Fatty Acids (FISH  OIL) 1000 MG CAPS Take 1 capsule by mouth daily.    No facility-administered encounter medications on file as of 03/15/2017.     Functional Status:   In your present state of health, do you have any difficulty performing the following activities: 03/22/2017 03/15/2017  Hearing? N N  Vision? N N  Difficulty concentrating or making decisions? N N  Walking or climbing stairs? Y Y  Dressing or  bathing? N N  Doing errands, shopping? Y Y  Preparing Food and eating ? - N  Using the Toilet? - N  Some recent data might be hidden    Fall/Depression Screening:    Fall Risk  03/22/2017  Falls in the past year? Yes  Number falls in past yr: 2 or more  Injury with Fall? Yes  Risk Factor Category  High Fall Risk  Risk for fall due to : History of fall(s);Impaired balance/gait;Other (Comment)  Risk for fall due to: Comment Dx: Parkinson's  Follow up Falls prevention discussed   PHQ 2/9 Scores 03/22/2017 03/15/2017 03/12/2017  PHQ - 2 Score 0 0 0  Exception Documentation Patient refusal - -    Assessment:    THN CM met with Wendy Reynolds and her daughter and POA, Wendy Reynolds (who is also disabled and unable to provide active supportive care related to mobility as she ambulates using crutches) at her home. Wendy Reynolds speaks in a soft whisper tone of voice.  During the assessment her daughter voiced concern with some noted confusion at intervals and walking without her walker at intervals.  Wendy Reynolds also reports concerns with bats found in the home    Frequent Falls-  Wendy Reynolds reports a fall on evening of March 08, 2017 (scalp laceration requiring staples), March 09 2017 (re injury scalp laceration related to bat) and March 14 2017 on her right hip without injury just bruises on right hip Wendy Reynolds is scheduled to be seen by Dr Gerarda Fraction on March 20 2017 for staple removal after her last fall on March 08 2017 .   With fall risk assessment THN CM confirmed Wendy [facility] after her October 15 2016 ED visit With review of her medications she confirms sinemet decrease but there is no noted change plus no labs have been done to check her potassium levels.  Discussed possible change of medications to a blister pack process.  Pt listed with 10 medications THN CM discussed her history of hypotension related to falls On examination Wendy Reynolds noted with various bruises Wendy  Reynolds continues to drive per her daughter Wendy Reynolds observed walking during her home visit and does so at a fast pace.  Parkinson Disease- Sees neurologist; next follow up is in February 2019  Poor appetite- Lost 14 lbs, does not eat well but is able to get food in the home last used anxiety medication one week ago.  Reports using herbal teas also    DME-no bp cuff to check for hypotension episodes Has a Rolator that has a red guiding light for assistance with walking.    Follow up appointments Wendy Wolters states she is scheduled to see her eye doctor in October 2018  Pcp on March 20 2017-Neurologist to be seen February 2019   Plan: To follow up with Wendy Case in 1-2 weeks for follow up calls and home visit Route note to pcp and neurologist Referral to [facility] for Transportation concerns, community resources needed including personal care services, mental health  resources for depression, anxiety, alcohol hx, Safety alert services needed for frequent falls   [facility] CM Care Plan Problem One     Most Recent Value  Care Plan Problem One  frequent falls   Role Documenting the Problem One  Care Management [place] for Problem One  Active  THN Long Term Goal   Over the next 60 days, patient with have less falls with fall prevention interventions in place  [facility] Long Term Goal Start Date  03/15/17  Interventions for Problem One Long Term Goal  fall risk assessment, education on inteventions for fall prevention, referral to community resources, referral to [place] CM Short Term Goal #1   over the next 30 days patient  Interventions for Short Term Goal #1  fall risk assessment, education on inteventions for fall prevention (Shoes, clutter, therapy, DME, home safety checks, medication side effects) referral to community resources, referral to [place] Problem Two     Most Recent Value  Care Plan Problem Two  Lack of community and support resources   Role Documenting  the Problem Two  Care Management [place] for Problem Two  Active  Interventions for Problem Two Long Term Goal   referral to [place], assess for continued needs during home visits, collaborate with s=community resource providers & [place] Term Goal  over the next 45 days patient will be connected with community resources to assist with home management of chronic  illnesses  THN Long Term Goal Start Date  03/15/17  [facility] CM Short Term Goal #1   over the next 30 days patient will demonstrate use of community resources for management of home care   [facility] CM Short Term Goal #1 Start Date  03/15/17  Interventions for Short Term Goal #2   referral to [facility] SW, assess for continued needs during home visits, collaborate with s=community resource providers & [place]. Lavina Hamman, RN, BSN, [place] Care Management [phone]

## 2017-03-18 ENCOUNTER — Other Ambulatory Visit: Payer: Self-pay | Admitting: *Deleted

## 2017-03-18 NOTE — Patient Outreach (Signed)
[facility]) Care Management  03/18/2017  Wendy Reynolds 1941/04/18 [id number]   Care coordination  [facility] CM received a call without message noted on CM mobile from [name]'s number [facility] CM received a call from Wendy Reynolds, friend of [name] with [name] in background at [facility] 03/18/17 stating that [name] was staying with friend Wendy Reynolds since her daughter ws hospitalized this weekend and is requesting assistance with coordination of 24 hour supervision services to assist [name] related to frequent falls.  Wendy Reynolds states that they will be able to afford services after [facility] CM discussed private duty nursing services but an out of pocket fee for [name] without medicaid services.  Wendy Reynolds left her number as [phone]  THN CM consulted with THN SW, Wendy Reynolds.  Referred to ADTS, Wendy Reynolds. [facility] SW referral completed Refer to entered order [facility] CM spoke with Wendy Reynolds to review cas information and provided the contact information for Wendy Reynolds, friend for Wendy Reynolds to contact to initiate services for Ms Wendy Reynolds  Plans: Continue to collaborate with [facility] SW/ADTS staff Added friend Wendy Reynolds to demographics in EPIC Returned a call to [name] and friend Wendy Reynolds to update, answer questions and to assess for further needs - voiced appreciation of services rendered Confirmed there is a son in [place] that will assist with cost of services  Wendy L. Noelle Penner, RN, BSN, CCM [facility] Care Management [phone]

## 2017-03-20 DIAGNOSIS — R2681 Unsteadiness on feet: Secondary | ICD-10-CM | POA: Diagnosis not present

## 2017-03-20 DIAGNOSIS — Z681 Body mass index (BMI) 19 or less, adult: Secondary | ICD-10-CM | POA: Diagnosis not present

## 2017-03-20 DIAGNOSIS — Z0001 Encounter for general adult medical examination with abnormal findings: Secondary | ICD-10-CM | POA: Diagnosis not present

## 2017-03-21 ENCOUNTER — Other Ambulatory Visit: Payer: Self-pay | Admitting: *Deleted

## 2017-03-21 NOTE — Patient Outreach (Signed)
[facility]) Care Management  03/21/2017  Wendy Reynolds Aug 08, 1940 [id number]   CSW had received referral from [facility], Selena Batten for [employer] (transportation, 24 hour care, etc). CSW called & spoke with patient to arrange intial home visit for tomorrow - Fri, Aug 24th at 11:30am. Full assessment & note to follow.    Lincoln Maxin, LCSW [facility]  Clinical Social Worker cell #: [phone]

## 2017-03-22 ENCOUNTER — Encounter: Payer: Self-pay | Admitting: *Deleted

## 2017-03-22 ENCOUNTER — Other Ambulatory Visit: Payer: Self-pay | Admitting: *Deleted

## 2017-03-22 NOTE — Patient Outreach (Signed)
[facility]) Care Management  [facility] Social Work  03/22/2017  DARRIE BETSCH 1941-06-25 [id number]   Encounter Medications:  Outpatient Encounter Prescriptions as of 03/22/2017  Medication Sig Note  . carbidopa-levodopa (SINEMET IR) 25-100 MG tablet Take 1 tablet by mouth 3 (three) times daily.    . cholecalciferol (VITAMIN D) 1000 units tablet Take 1,000 Units by mouth daily.   . clonazePAM (KLONOPIN) 1 MG tablet Take 1-1.5 mg by mouth daily as needed for anxiety.    . DULoxetine (CYMBALTA) 60 MG capsule Take 1 capsule (60 mg total) by mouth daily.   . hydrOXYzine (ATARAX/VISTARIL) 50 MG tablet Take 50-100 mg by mouth at bedtime as needed for anxiety (for sleep).  03/08/2017: Prescribed one tablet up to 4 times daily as needed  . ibuprofen (ADVIL,MOTRIN) 400 MG tablet Take 1 tablet (400 mg total) by mouth every 6 (six) hours as needed.   Marland Kitchen levothyroxine (SYNTHROID, LEVOTHROID) 50 MCG tablet Take 50 mcg by mouth daily before breakfast.    . lisinopril (PRINIVIL,ZESTRIL) 5 MG tablet Take 5 mg by mouth daily.    . Multiple Vitamin (MULTIVITAMIN WITH MINERALS) TABS tablet Take 1 tablet by mouth daily.   . Omega-3 Fatty Acids (FISH OIL) 1000 MG CAPS Take 1 capsule by mouth daily.    No facility-administered encounter medications on file as of 03/22/2017.     Functional Status:  In your present state of health, do you have any difficulty performing the following activities: 03/22/2017  Hearing? N  Vision? N  Difficulty concentrating or making decisions? N  Walking or climbing stairs? Y  Dressing or bathing? N  Doing errands, shopping? Y  Some recent data might be hidden    Fall/Depression Screening:  PHQ 2/9 Scores 03/22/2017 03/12/2017  PHQ - 2 Score 0 0  Exception Documentation Patient refusal -    Assessment:  CSW had received referral from [facility], Kim on 8/20 for community resources. Mrs Lightbourn has a disabled daughter, Victorino Dike who is currently at [employer] - behavioral health facility and hopes to return home with patient soon. Patient has a 24 hour live-in sitter, [name] from [facility] since 03/18/17. Patient was diagnosed with Parkinson's about 4 years ago but has been functioning well on her own - currently does not require any assistance with ADLs and reports that she drives herself to her doctors appointments. CSW provided patient with brochure from [facility] - patient states that she had called there 2 weeks ago to get on the Meals on Wheels waitlist. Patient had HCPOA completed already - CSW scanned into Epic. CSW provided patient with information & pack on Living Will and encouraged her to complete and CSW will scan into Epic as well. Patient states that she has been working with [facility] SW, Larose Hires to try to find an ALF & researching an alzheimers/parkinson's grant that would cover $1,500/month. CSW will mail patient list of [facility] and get in touch with [place] at [facility] to collaborate with placement. Patient states that she does not plan on moving into an ALF though for another year or two.   Plan:   CSW will follow-up in 3 weeks to ensure that resources have been received and will assist as needed.   TH[facility] CM Care Plan Problem One     Most Recent Value  Care Plan Problem One  need for community resources  Role Documenting the Problem One  Clinical Social Worker  Care Plan for Problem One  Active  [facility] Long Term Goal   Patient will be connected with community resources as requested  [facility] Long Term Goal Start Date  03/22/17  Interventions for Problem One Long Term Goal  CSW provided patient with information on Aging[facility] Disability [facility] Transit Services for assistance with 24 hour care and transportation      Lincoln Maxin, LCSW [facility]  Clinical Social Worker cell #: [phone]

## 2017-03-26 NOTE — Patient Outreach (Signed)
Request received from Kelly Harrison, LCSW to mail patient personal care resources.  Information mailed today. 

## 2017-03-28 ENCOUNTER — Other Ambulatory Visit: Payer: Self-pay | Admitting: *Deleted

## 2017-03-28 NOTE — Patient Outreach (Signed)
[facility]) Care Management  03/27/2017  Wendy Reynolds 01/17/1941 [id number]   Care coordination [facility] Cm received a message from Ms Wendy Reynolds about her scheduled home visit Reports she is going to have a hair appointment prior to her home visit When [facility] Cm and Ms Wendy Reynolds able to speak she confirms she is now home at her house with home services versus at her friends home  Plan [facility] will see Ms Wendy Reynolds for home visit this week   Wendy BradfordKimberly L. Noelle PennerGibbs, RN, BSN, CCM [facility] Care Management [phone]

## 2017-03-29 ENCOUNTER — Other Ambulatory Visit: Payer: Self-pay | Admitting: *Deleted

## 2017-04-03 ENCOUNTER — Other Ambulatory Visit: Payer: Self-pay | Admitting: *Deleted

## 2017-04-03 DIAGNOSIS — F339 Major depressive disorder, recurrent, unspecified: Secondary | ICD-10-CM | POA: Diagnosis not present

## 2017-04-03 DIAGNOSIS — I10 Essential (primary) hypertension: Secondary | ICD-10-CM | POA: Diagnosis not present

## 2017-04-03 DIAGNOSIS — G20 Parkinson's disease: Secondary | ICD-10-CM | POA: Diagnosis not present

## 2017-04-03 DIAGNOSIS — E034 Atrophy of thyroid (acquired): Secondary | ICD-10-CM | POA: Diagnosis not present

## 2017-04-03 DIAGNOSIS — Z111 Encounter for screening for respiratory tuberculosis: Secondary | ICD-10-CM | POA: Diagnosis not present

## 2017-04-03 NOTE — Patient Outreach (Signed)
[facility]) Care Management  04/03/2017  Wendy Reynolds 10/30/1940 [id number]   [facility] CM called to follow up with [name] after Reviewed case concerns [facility] including [facility] plus receiving an in basket message from [facility] CM telephonic RN on 04/02/17 evening that [name] states she is going to Assisted living facility on Thursday 04/04/17.  THN SW updated on this in  Basket message response  HIPPA compliant voice message left on [name] and her daughter, [name]'s mobile numbersthat includedTHN CM mobile number for a return call   Plan Pending a return call from [name], [facility] CM will make another attempt to reach them at another time this week   Martrell Eguia L. Noelle PennerGibbs, RN, BSN, CCM [facility] Care Management [phone]

## 2017-04-10 DIAGNOSIS — E034 Atrophy of thyroid (acquired): Secondary | ICD-10-CM | POA: Diagnosis not present

## 2017-04-10 DIAGNOSIS — F339 Major depressive disorder, recurrent, unspecified: Secondary | ICD-10-CM | POA: Diagnosis not present

## 2017-04-10 DIAGNOSIS — G4700 Insomnia, unspecified: Secondary | ICD-10-CM | POA: Diagnosis not present

## 2017-04-10 DIAGNOSIS — G20 Parkinson's disease: Secondary | ICD-10-CM | POA: Diagnosis not present

## 2017-04-10 DIAGNOSIS — I10 Essential (primary) hypertension: Secondary | ICD-10-CM | POA: Diagnosis not present

## 2017-04-11 ENCOUNTER — Other Ambulatory Visit: Payer: Self-pay | Admitting: *Deleted

## 2017-04-11 NOTE — Patient Outreach (Signed)
[facility]) Care Management  04/11/2017  GENESE QUEBEDEAUX Sep 17, 1940 [id number]   CSW called & spoke with patient's daughter, Anderson Malta [phone] to confirm that patient has moved into [facility] community[facility]ALF in [place] on 04/04/17. The address: [address], [place], [place] Phone: [phone] to [employer]. CSW called & spoke with patient [phone] - patient informed CSW that she is very pleased with the facility & hopes that eventually her daughter, Anderson Malta will either get an apartment close by or move in as well.   CSW will close case as patient has met her goals & having moved into an ALF and has 24 hour support there will no longer require CSW assistance. CSW will inform [facility] RNCM, Joellyn Quails as well.   CSW will fax an update to patient's Primary Care Physician, Dr. Redmond School to ensure that they are aware of CSW's involvement with patient's plan of care & patients move to ALF.   Raynaldo Opitz, LCSW [facility]  Clinical Social Worker cell #: [phone]

## 2017-04-12 ENCOUNTER — Ambulatory Visit: Payer: Self-pay | Admitting: *Deleted

## 2017-04-17 DIAGNOSIS — I10 Essential (primary) hypertension: Secondary | ICD-10-CM | POA: Diagnosis not present

## 2017-04-17 DIAGNOSIS — G4701 Insomnia due to medical condition: Secondary | ICD-10-CM | POA: Diagnosis not present

## 2017-04-17 DIAGNOSIS — F339 Major depressive disorder, recurrent, unspecified: Secondary | ICD-10-CM | POA: Diagnosis not present

## 2017-04-17 DIAGNOSIS — E034 Atrophy of thyroid (acquired): Secondary | ICD-10-CM | POA: Diagnosis not present

## 2017-05-01 ENCOUNTER — Telehealth: Payer: Self-pay | Admitting: *Deleted

## 2017-05-01 NOTE — Patient Outreach (Signed)
[facility]) Care Management  05/01/2017  Wendy Reynolds 09-Oct-1940 [id number]   [facility] CM received a voice message (after [facility] CM left several voice messages for Wendy Reynolds and Wendy Reynolds) left on 04/19/17 at 1522 from Wendy Verry's daughter Wendy Reynolds to state she received [facility] CM voice message to call [facility] CM "a couple weeks" "Been a lot going on and I don't know if anyone's updated you. Mom is now not living at home. She is in an assisted living in [place]" "She's in good shape and she is there I was here at her home until we can sell it" "thank you very much and I really really really appreciate all of your help" She left their home and cell numbers.  Plans THN CM will close case for closure reason Consumer enrolled in an external program Message sent to [place] via in basket  Closure Letters sent to Wendy Zenia Resides and Dr Gerarda Fraction  [facility] CM Care Plan Problem One     Most [place] Problem One  frequent falls   Role Documenting the Problem One  Care Management [place] for Problem One  Active  [facility] Long Term Goal   Over the next 60 days, patient with have less falls with fall prevention interventions in place  [facility] Long Term Goal Start Date  03/15/17  [facility] Long Term Goal Met Date  04/19/17  Interventions for Problem One Long Term Goal  fall risk assessment, education on inteventions for fall prevention, referral to community resources, referral to [place] CM Short Term Goal #1   over the next 30 days patient  [facility] CM Short Term Goal #1 Start Date  03/15/17  [facility] CM Short Term Goal #1 Met Date  04/19/17  Interventions for Short Term Goal #1  fall risk assessment, education on inteventions for fall prevention (Shoes, clutter, therapy, DME, home safety checks, medication side effects) referral to community resources, referral to [place] Problem Two     Most Recent Value  Care Plan Problem Two  Lack of community and support resources   Role  Documenting the Problem Two  Care Management [place] for Problem Two  Active  Interventions for Problem Two Long Term Goal   referral to [place], assess for continued needs during home visits, collaborate with s=community resource providers & [facility] SW  [facility] Long Term Goal  over the next 45 days patient will be connected with community resources to assist with home management of chronic  illnesses  [facility] Long Term Goal Start Date  03/15/17  [facility] Long Term Goal Met Date  03/29/17  [facility] CM Short Term Goal #1   over the next 30 days patient will demonstrate use of community resources for management of home care   [facility] CM Short Term Goal #1 Start Date  03/15/17  [facility] CM Short Term Goal #1 Met Date   03/29/17  Interventions for Short Term Goal #2   referral to [facility] SW, assess for continued needs during home visits, collaborate with s=community resource providers & [place]. Lavina Hamman, RN, BSN, [place] Care Management [phone]

## 2017-05-01 NOTE — Patient Outreach (Signed)
[facility]) Care Management   03/29/2017  Wendy Reynolds 02/09/1941 [id number]  Wendy Reynolds is an 76 y.o. female  Subjective: "I'm trying"  Objective:    Review of Systems  HENT: Negative.   Eyes: Negative.   Respiratory: Negative.   Cardiovascular: Negative.   Gastrointestinal: Negative.   Genitourinary: Negative.   Musculoskeletal: Positive for joint pain.  Skin: Negative.   Neurological: Positive for weakness. Negative for dizziness, tingling, tremors, sensory change, speech change, focal weakness, seizures, loss of consciousness and headaches.  Endo/Heme/Allergies: Negative for environmental allergies and polydipsia. Bruises/bleeds easily.  Psychiatric/Behavioral: The patient is nervous/anxious.     Physical Exam  Constitutional: She is oriented to person, place, and time. She appears well-developed and well-nourished.  HENT:  Head: Normocephalic and atraumatic.  Eyes: Pupils are equal, round, and reactive to light. Conjunctivae are normal.  Neck: Normal range of motion. Neck supple.  Cardiovascular: Normal rate and regular rhythm.   Respiratory: Effort normal and breath sounds normal.  GI: Soft. Bowel sounds are normal.  Musculoskeletal: Normal range of motion.  Neurological: She is alert and oriented to person, place, and time.  Skin: Skin is warm and dry.  Psychiatric: She has a normal mood and affect. Her behavior is normal. Judgment and thought content normal.    Encounter Medications:   Outpatient Encounter Prescriptions as of 03/29/2017  Medication Sig Note  . carbidopa-levodopa (SINEMET IR) 25-100 MG tablet Take 1 tablet by mouth 3 (three) times daily.    . cholecalciferol (VITAMIN D) 1000 units tablet Take 1,000 Units by mouth daily.   . clonazePAM (KLONOPIN) 1 MG tablet Take 1-1.5 mg by mouth daily as needed for anxiety.    . DULoxetine (CYMBALTA) 60 MG capsule Take 1 capsule (60 mg total) by mouth daily.   . hydrOXYzine  (ATARAX/VISTARIL) 50 MG tablet Take 50-100 mg by mouth at bedtime as needed for anxiety (for sleep).  03/08/2017: Prescribed one tablet up to 4 times daily as needed  . ibuprofen (ADVIL,MOTRIN) 400 MG tablet Take 1 tablet (400 mg total) by mouth every 6 (six) hours as needed.   Marland Kitchen levothyroxine (SYNTHROID, LEVOTHROID) 50 MCG tablet Take 50 mcg by mouth daily before breakfast.    . lisinopril (PRINIVIL,ZESTRIL) 5 MG tablet Take 5 mg by mouth daily.    . Multiple Vitamin (MULTIVITAMIN WITH MINERALS) TABS tablet Take 1 tablet by mouth daily.   . Omega-3 Fatty Acids (FISH OIL) 1000 MG CAPS Take 1 capsule by mouth daily.    No facility-administered encounter medications on file as of 03/29/2017.     Functional Status:   In your present state of health, do you have any difficulty performing the following activities: 03/22/2017 03/15/2017  Hearing? N N  Vision? N N  Difficulty concentrating or making decisions? N N  Walking or climbing stairs? Y Y  Dressing or bathing? N N  Doing errands, shopping? Y Y  Preparing Food and eating ? - N  Using the Toilet? - N  Some recent data might be hidden    Fall/Depression Screening:    Fall Risk  03/22/2017  Falls in the past year? Yes  Number falls in past yr: 2 or more  Injury with Fall? Yes  Risk Factor Category  High Fall Risk  Risk for fall due to : History of fall(s);Impaired balance/gait;Other (Comment)  Risk for fall due to: Comment Dx: Parkinson's  Follow up Falls prevention discussed   PHQ 2/9 Scores 03/22/2017 03/15/2017 03/12/2017  PHQ - 2 Score 0 0 0  Exception Documentation Patient refusal - -    Assessment:    Met with Wendy Reynolds, her daughter and personal care services (PCS) aide at her home.  Wendy Reynolds reports she will have PCS ("[name]") on Friday, Saturday and Sunday of this week and then she is not sure if she will be able to continue to have funding to keep PCS  Mobility/Safety During the home visit [facility] CM noted Wendy Reynolds turning  corners too quickly and getting up at a fast pace.  She has been encouraged by [facility] CM and PCS to slow down and to watch out for this. One particular episode of unsafe mobility occurred during the home visit when Wendy Reynolds went to get the [facility] aide schedule, was not walking straight and almost fell. She has a Economist alert necklace Wendy Reynolds continues to drive and her preference is to continue driving which assists her and Wendy Reynolds [facility] CM discussed not cooking related to Wendy Reynolds voiced concern of safety  Follow up appointments  Dr Gerarda Fraction was seen last on 03/20/17 She reports Dr Gerarda Fraction offered her balcofen for muscle cramps but she did not fill balcofen because she does not think it would be beneficial without telling Dr Gerarda Fraction. [facility] CM encouraged her to call and discuss this with Dr Gerarda Fraction. She is scheduled to be seen again on 04/17/17 for labs.  Wendy Reynolds also discussed that she was recommended to have a colonoscopy but she did not want to have a colonoscopy.  [facility] CM attempted to encouraged her to have a colonoscopy and speak with Dr Gerarda Fraction but Wendy Reynolds and her daughter confirms there is no family nor personal GI history to warrant a colonoscopy  The PCS aide, [name], shared that she is frequently encouraging both Wendy Reynolds and Wendy Reynolds, daughter, to eat and drink.  Wendy Reynolds has been working with Daneen Schick since 03/21/17 alternate using her two walkers in her home and around block with a noted shuffle in gait  Preventative care  Wendy Reynolds needs her eyes checked.  She confirms she does balance exercises.  Her last eye exam was checked in January 2018 and she is scheduled for another eye exam in October 2018.  Wendy Reynolds reports having plastic surgery on her lip x 3 procedures but wanting further procedure to help with pronounciation of words.  Noted at this time she bearly moves her top lip when speaking   Wendy Reynolds is scheduled to [facility] services with [facility] soon    Plan:  [facility] CM encouraged Wendy Reynolds, Wendy Reynolds and her son to  have a family conference and offered with coordinating one to assist with goals of care [facility] CM had been asked to check with the status of services with [facility] and [facility] related to [facility]  Follow up with Wendy Reynolds in 2-4 weeks for home visit and as needed for updated from [facility] and [facility] follow up Route note to pcp and other pertinent medical providers   [facility] CM Care Plan Problem One     Most Recent Value  Care Plan Problem One  frequent falls   Role Documenting the Problem One  Care Management Coordinator  Care Plan for Problem One  Active  [facility] Long Term Goal   Over the next 60 days, patient with have less falls with fall prevention interventions in place  [facility] Long Term Goal Start Date  03/15/17  Interventions for Problem One Long Term  Goal  fall risk assessment, education on inteventions for fall prevention, referral to community resources, referral to [place] CM Short Term Goal #1   over the next 30 days patient  [facility] CM Short Term Goal #1 Start Date  03/15/17  Interventions for Short Term Goal #1  fall risk assessment, education on inteventions for fall prevention (Shoes, clutter, therapy, DME, home safety checks, medication side effects) referral to community resources, referral to [place] Problem Two     Most Recent Value  Care Plan Problem Two  Lack of community and support resources   Role Documenting the Problem Two  Care Management [place] for Problem Two  Active  Interventions for Problem Two Long Term Goal   referral to [place], assess for continued needs during home visits, collaborate with s=community resource providers & [facility] SW  [facility] Long Term Goal  over the next 45 days patient will be connected with community resources to assist with home management of chronic  illnesses  [facility] Long Term Goal Start Date  03/15/17  [facility] Long Term Goal Met Date  03/29/17  [facility] CM Short Term Goal #1   over the next 30 days patient will  demonstrate use of community resources for management of home care   [facility] CM Short Term Goal #1 Start Date  03/15/17  [facility] CM Short Term Goal #1 Met Date   03/29/17  Interventions for Short Term Goal #2   referral to [facility] SW, assess for continued needs during home visits, collaborate with s=community resource providers & [place]. Lavina Hamman, RN, BSN, [place] Care Management [phone]

## 2017-05-08 DIAGNOSIS — F339 Major depressive disorder, recurrent, unspecified: Secondary | ICD-10-CM | POA: Diagnosis not present

## 2017-05-08 DIAGNOSIS — I10 Essential (primary) hypertension: Secondary | ICD-10-CM | POA: Diagnosis not present

## 2017-05-08 DIAGNOSIS — G4700 Insomnia, unspecified: Secondary | ICD-10-CM | POA: Diagnosis not present

## 2017-05-08 DIAGNOSIS — E034 Atrophy of thyroid (acquired): Secondary | ICD-10-CM | POA: Diagnosis not present

## 2017-05-08 DIAGNOSIS — G20 Parkinson's disease: Secondary | ICD-10-CM | POA: Diagnosis not present

## 2017-05-11 DIAGNOSIS — E034 Atrophy of thyroid (acquired): Secondary | ICD-10-CM | POA: Diagnosis not present

## 2017-05-11 DIAGNOSIS — G20 Parkinson's disease: Secondary | ICD-10-CM | POA: Diagnosis not present

## 2017-05-11 DIAGNOSIS — F339 Major depressive disorder, recurrent, unspecified: Secondary | ICD-10-CM | POA: Diagnosis not present

## 2017-05-11 DIAGNOSIS — G4700 Insomnia, unspecified: Secondary | ICD-10-CM | POA: Diagnosis not present

## 2017-05-11 DIAGNOSIS — I10 Essential (primary) hypertension: Secondary | ICD-10-CM | POA: Diagnosis not present

## 2017-05-13 DIAGNOSIS — I10 Essential (primary) hypertension: Secondary | ICD-10-CM | POA: Diagnosis not present

## 2017-05-13 DIAGNOSIS — G4700 Insomnia, unspecified: Secondary | ICD-10-CM | POA: Diagnosis not present

## 2017-05-13 DIAGNOSIS — G20 Parkinson's disease: Secondary | ICD-10-CM | POA: Diagnosis not present

## 2017-05-13 DIAGNOSIS — F339 Major depressive disorder, recurrent, unspecified: Secondary | ICD-10-CM | POA: Diagnosis not present

## 2017-05-13 DIAGNOSIS — E034 Atrophy of thyroid (acquired): Secondary | ICD-10-CM | POA: Diagnosis not present

## 2017-05-14 DIAGNOSIS — G4700 Insomnia, unspecified: Secondary | ICD-10-CM | POA: Diagnosis not present

## 2017-05-14 DIAGNOSIS — E034 Atrophy of thyroid (acquired): Secondary | ICD-10-CM | POA: Diagnosis not present

## 2017-05-14 DIAGNOSIS — F339 Major depressive disorder, recurrent, unspecified: Secondary | ICD-10-CM | POA: Diagnosis not present

## 2017-05-14 DIAGNOSIS — G20 Parkinson's disease: Secondary | ICD-10-CM | POA: Diagnosis not present

## 2017-05-14 DIAGNOSIS — I10 Essential (primary) hypertension: Secondary | ICD-10-CM | POA: Diagnosis not present

## 2017-05-15 DIAGNOSIS — E034 Atrophy of thyroid (acquired): Secondary | ICD-10-CM | POA: Diagnosis not present

## 2017-05-15 DIAGNOSIS — F339 Major depressive disorder, recurrent, unspecified: Secondary | ICD-10-CM | POA: Diagnosis not present

## 2017-05-15 DIAGNOSIS — G4700 Insomnia, unspecified: Secondary | ICD-10-CM | POA: Diagnosis not present

## 2017-05-15 DIAGNOSIS — G20 Parkinson's disease: Secondary | ICD-10-CM | POA: Diagnosis not present

## 2017-05-15 DIAGNOSIS — I10 Essential (primary) hypertension: Secondary | ICD-10-CM | POA: Diagnosis not present

## 2017-05-16 DIAGNOSIS — E034 Atrophy of thyroid (acquired): Secondary | ICD-10-CM | POA: Diagnosis not present

## 2017-05-16 DIAGNOSIS — G20 Parkinson's disease: Secondary | ICD-10-CM | POA: Diagnosis not present

## 2017-05-16 DIAGNOSIS — G4700 Insomnia, unspecified: Secondary | ICD-10-CM | POA: Diagnosis not present

## 2017-05-16 DIAGNOSIS — F339 Major depressive disorder, recurrent, unspecified: Secondary | ICD-10-CM | POA: Diagnosis not present

## 2017-05-16 DIAGNOSIS — I10 Essential (primary) hypertension: Secondary | ICD-10-CM | POA: Diagnosis not present

## 2017-05-21 DIAGNOSIS — G4700 Insomnia, unspecified: Secondary | ICD-10-CM | POA: Diagnosis not present

## 2017-05-21 DIAGNOSIS — F339 Major depressive disorder, recurrent, unspecified: Secondary | ICD-10-CM | POA: Diagnosis not present

## 2017-05-21 DIAGNOSIS — G20 Parkinson's disease: Secondary | ICD-10-CM | POA: Diagnosis not present

## 2017-05-21 DIAGNOSIS — I10 Essential (primary) hypertension: Secondary | ICD-10-CM | POA: Diagnosis not present

## 2017-05-21 DIAGNOSIS — E034 Atrophy of thyroid (acquired): Secondary | ICD-10-CM | POA: Diagnosis not present

## 2017-05-22 DIAGNOSIS — F339 Major depressive disorder, recurrent, unspecified: Secondary | ICD-10-CM | POA: Diagnosis not present

## 2017-05-22 DIAGNOSIS — G20 Parkinson's disease: Secondary | ICD-10-CM | POA: Diagnosis not present

## 2017-05-22 DIAGNOSIS — I10 Essential (primary) hypertension: Secondary | ICD-10-CM | POA: Diagnosis not present

## 2017-05-22 DIAGNOSIS — G4700 Insomnia, unspecified: Secondary | ICD-10-CM | POA: Diagnosis not present

## 2017-05-22 DIAGNOSIS — E034 Atrophy of thyroid (acquired): Secondary | ICD-10-CM | POA: Diagnosis not present

## 2017-05-23 DIAGNOSIS — F339 Major depressive disorder, recurrent, unspecified: Secondary | ICD-10-CM | POA: Diagnosis not present

## 2017-05-23 DIAGNOSIS — G20 Parkinson's disease: Secondary | ICD-10-CM | POA: Diagnosis not present

## 2017-05-23 DIAGNOSIS — I10 Essential (primary) hypertension: Secondary | ICD-10-CM | POA: Diagnosis not present

## 2017-05-23 DIAGNOSIS — G4700 Insomnia, unspecified: Secondary | ICD-10-CM | POA: Diagnosis not present

## 2017-05-23 DIAGNOSIS — E034 Atrophy of thyroid (acquired): Secondary | ICD-10-CM | POA: Diagnosis not present

## 2017-05-25 DIAGNOSIS — I10 Essential (primary) hypertension: Secondary | ICD-10-CM | POA: Diagnosis not present

## 2017-05-25 DIAGNOSIS — E034 Atrophy of thyroid (acquired): Secondary | ICD-10-CM | POA: Diagnosis not present

## 2017-05-25 DIAGNOSIS — G20 Parkinson's disease: Secondary | ICD-10-CM | POA: Diagnosis not present

## 2017-05-25 DIAGNOSIS — G4700 Insomnia, unspecified: Secondary | ICD-10-CM | POA: Diagnosis not present

## 2017-05-25 DIAGNOSIS — F339 Major depressive disorder, recurrent, unspecified: Secondary | ICD-10-CM | POA: Diagnosis not present

## 2017-05-27 DIAGNOSIS — G4700 Insomnia, unspecified: Secondary | ICD-10-CM | POA: Diagnosis not present

## 2017-05-27 DIAGNOSIS — F339 Major depressive disorder, recurrent, unspecified: Secondary | ICD-10-CM | POA: Diagnosis not present

## 2017-05-27 DIAGNOSIS — E034 Atrophy of thyroid (acquired): Secondary | ICD-10-CM | POA: Diagnosis not present

## 2017-05-27 DIAGNOSIS — G20 Parkinson's disease: Secondary | ICD-10-CM | POA: Diagnosis not present

## 2017-05-27 DIAGNOSIS — I10 Essential (primary) hypertension: Secondary | ICD-10-CM | POA: Diagnosis not present

## 2017-05-29 DIAGNOSIS — F339 Major depressive disorder, recurrent, unspecified: Secondary | ICD-10-CM | POA: Diagnosis not present

## 2017-05-29 DIAGNOSIS — I10 Essential (primary) hypertension: Secondary | ICD-10-CM | POA: Diagnosis not present

## 2017-05-29 DIAGNOSIS — E034 Atrophy of thyroid (acquired): Secondary | ICD-10-CM | POA: Diagnosis not present

## 2017-05-29 DIAGNOSIS — G4700 Insomnia, unspecified: Secondary | ICD-10-CM | POA: Diagnosis not present

## 2017-05-29 DIAGNOSIS — G4701 Insomnia due to medical condition: Secondary | ICD-10-CM | POA: Diagnosis not present

## 2017-05-29 DIAGNOSIS — M7552 Bursitis of left shoulder: Secondary | ICD-10-CM | POA: Diagnosis not present

## 2017-05-29 DIAGNOSIS — G20 Parkinson's disease: Secondary | ICD-10-CM | POA: Diagnosis not present

## 2017-08-02 ENCOUNTER — Other Ambulatory Visit (HOSPITAL_COMMUNITY): Payer: Self-pay | Admitting: Internal Medicine

## 2017-08-02 DIAGNOSIS — IMO0002 Reserved for concepts with insufficient information to code with codable children: Secondary | ICD-10-CM

## 2017-08-02 DIAGNOSIS — R229 Localized swelling, mass and lump, unspecified: Principal | ICD-10-CM

## 2017-08-13 ENCOUNTER — Encounter (HOSPITAL_COMMUNITY): Payer: Self-pay

## 2017-08-20 ENCOUNTER — Other Ambulatory Visit (HOSPITAL_COMMUNITY): Payer: Self-pay | Admitting: Internal Medicine

## 2017-08-20 ENCOUNTER — Ambulatory Visit (HOSPITAL_COMMUNITY)
Admission: RE | Admit: 2017-08-20 | Discharge: 2017-08-20 | Disposition: A | Discharge status: Home / Self Care | Payer: Medicare PPO | Source: Ambulatory Visit | Attending: Internal Medicine | Admitting: Internal Medicine

## 2017-08-20 ENCOUNTER — Encounter (HOSPITAL_COMMUNITY): Payer: Self-pay

## 2017-08-20 DIAGNOSIS — R229 Localized swelling, mass and lump, unspecified: Principal | ICD-10-CM

## 2017-08-20 DIAGNOSIS — T8160XA Unspecified acute reaction to foreign substance accidentally left during a procedure, initial encounter: Secondary | ICD-10-CM | POA: Diagnosis not present

## 2017-08-20 DIAGNOSIS — IMO0002 Reserved for concepts with insufficient information to code with codable children: Secondary | ICD-10-CM

## 2017-08-20 DIAGNOSIS — Y834 Other reconstructive surgery as the cause of abnormal reaction of the patient, or of later complication, without mention of misadventure at the time of the procedure: Secondary | ICD-10-CM | POA: Insufficient documentation

## 2017-08-20 DIAGNOSIS — Z9882 Breast implant status: Secondary | ICD-10-CM | POA: Diagnosis not present

## 2018-07-19 IMAGING — DX DG HAND COMPLETE 3+V*L*
3 series · 3 of 3 positions shown · non-contrast
Comparison: Left thumb dated 10/14/2014.

CLINICAL DATA: Reason region on the floor and fifth metacarpal
heads after falling backwards onto her hands today.

EXAM:
LEFT HAND - COMPLETE 3+ VIEW

[hand pa]
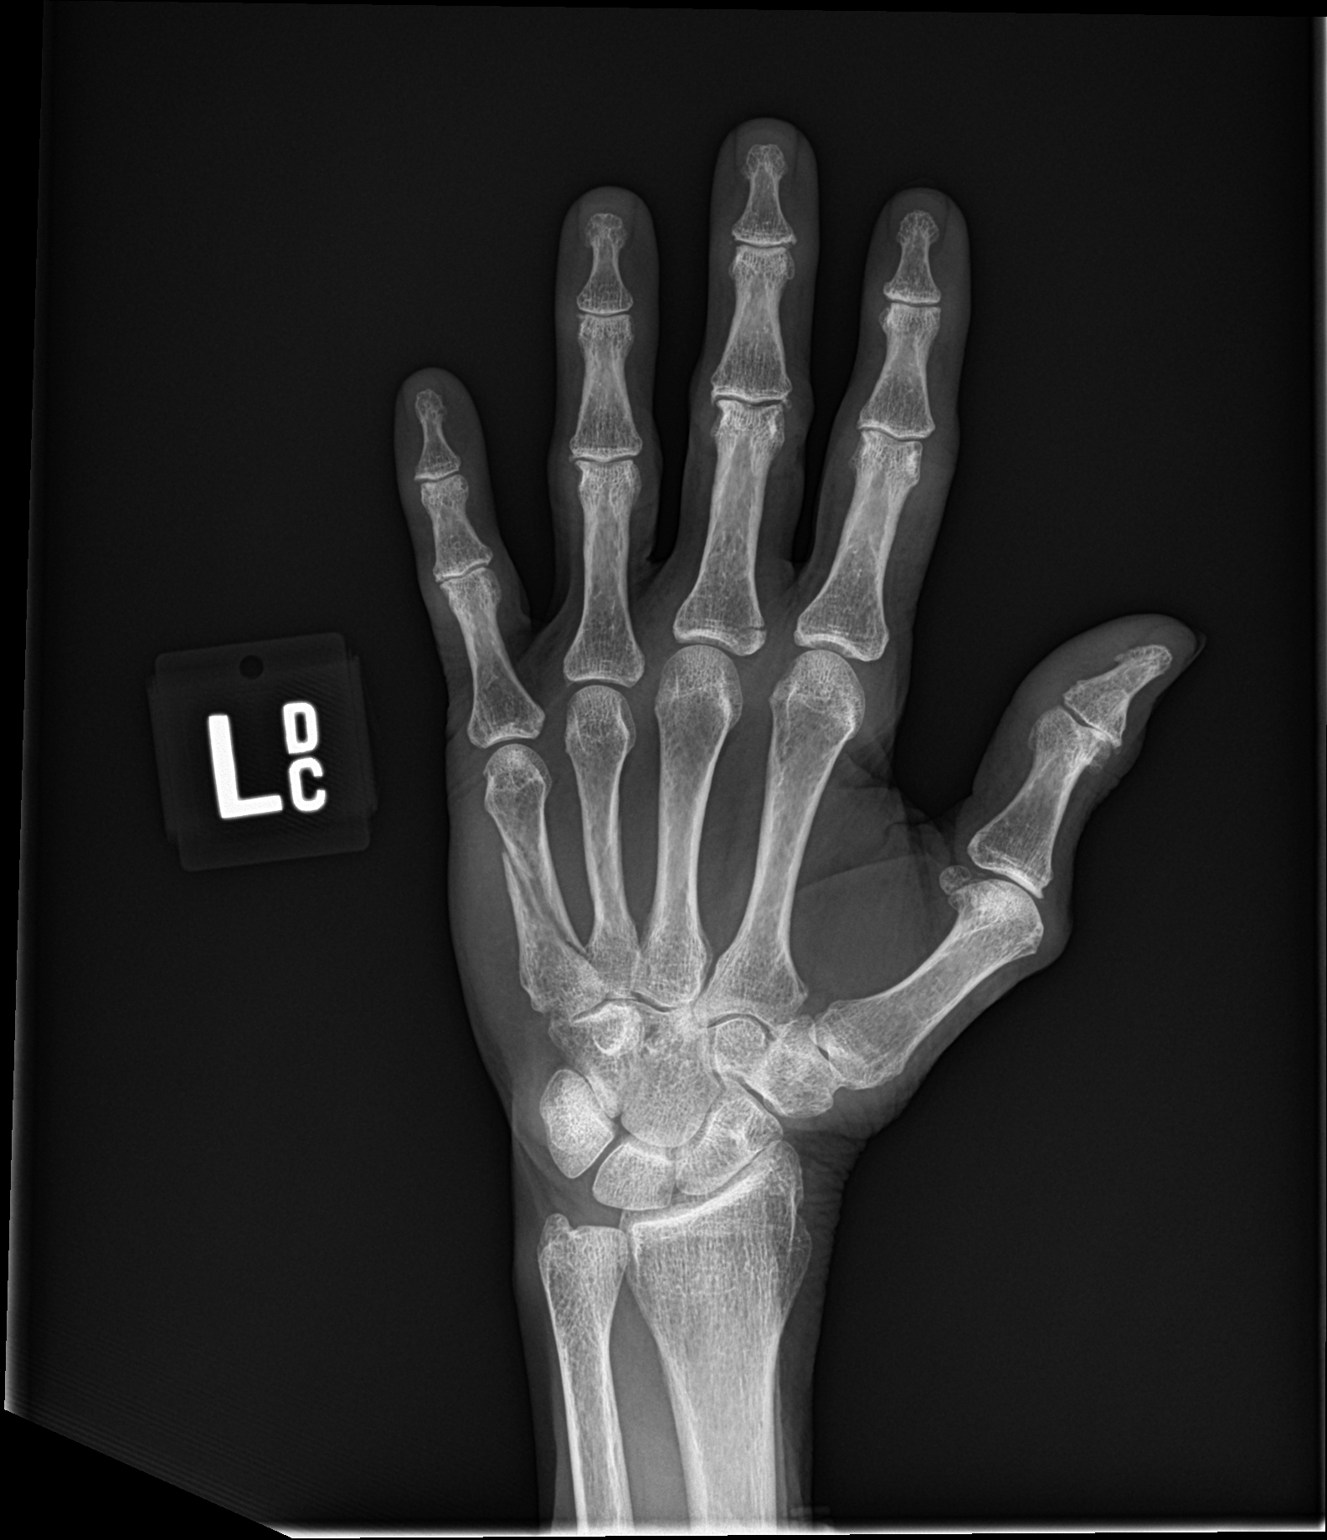

[hand obl]
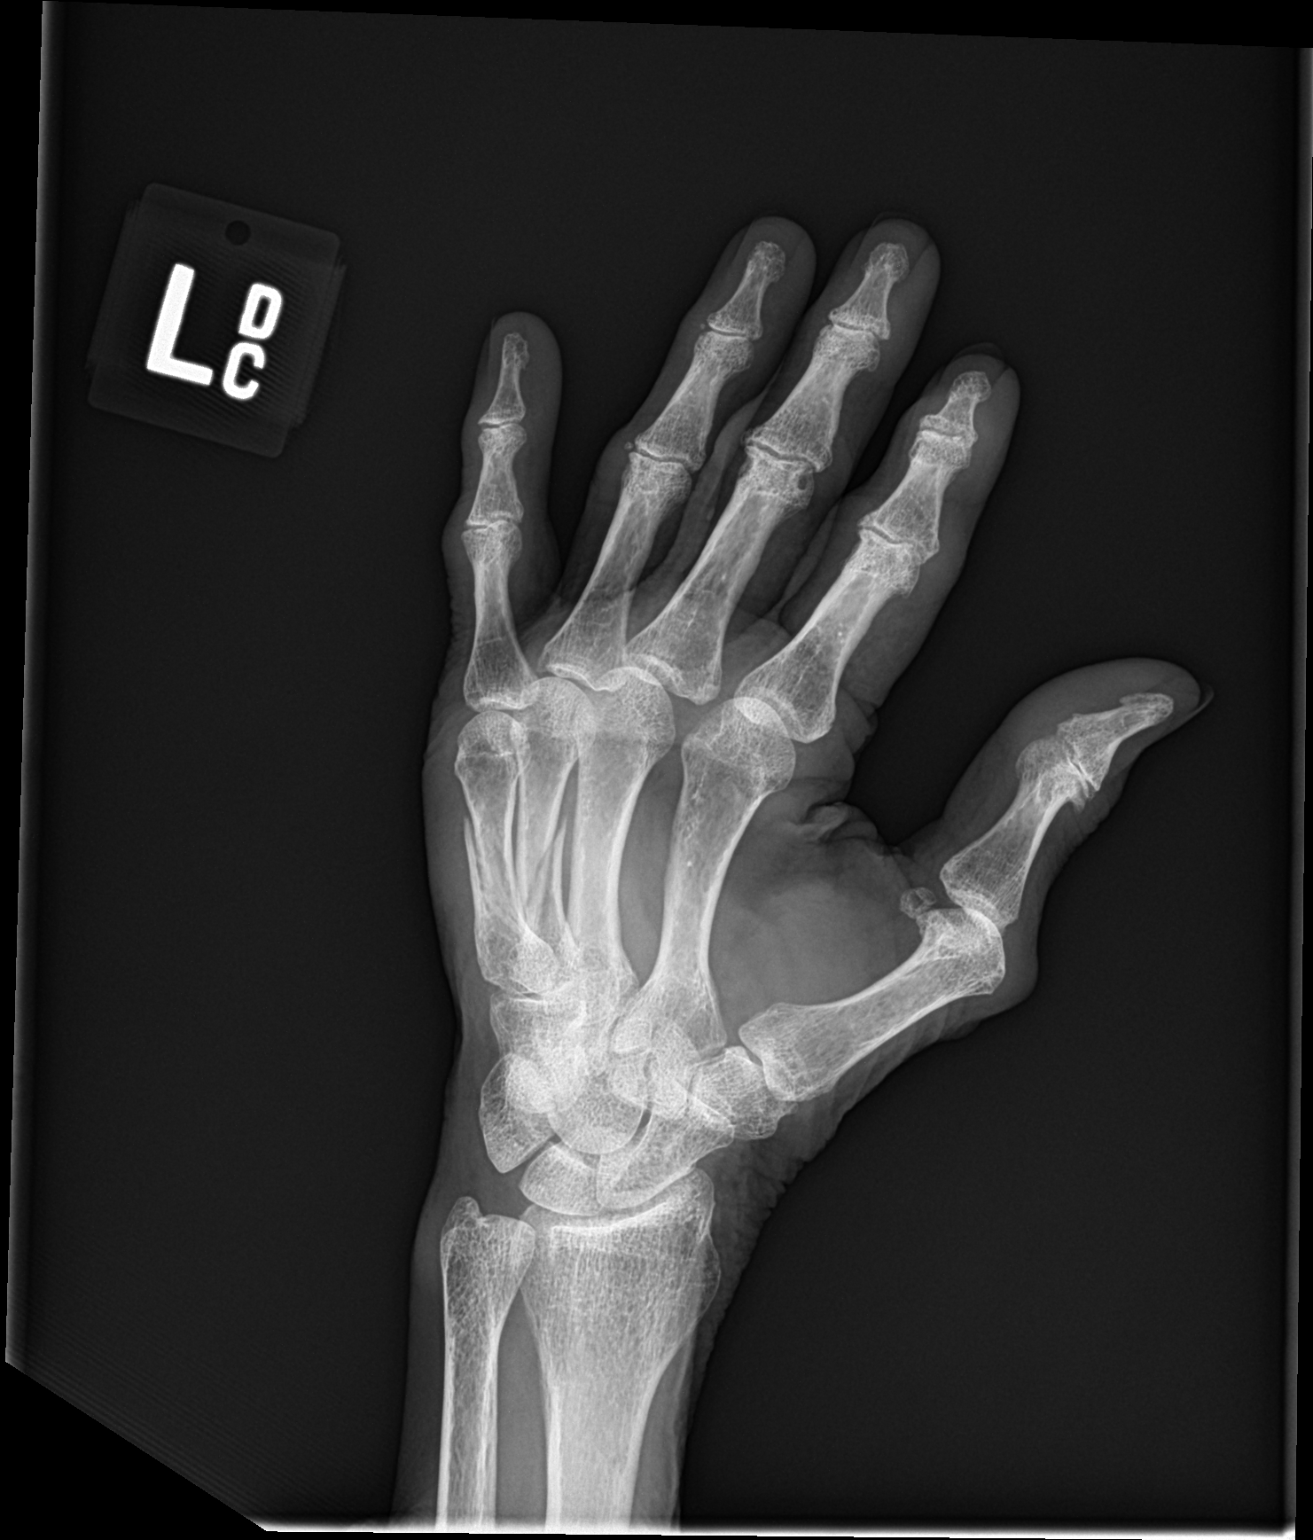

[hand lat]
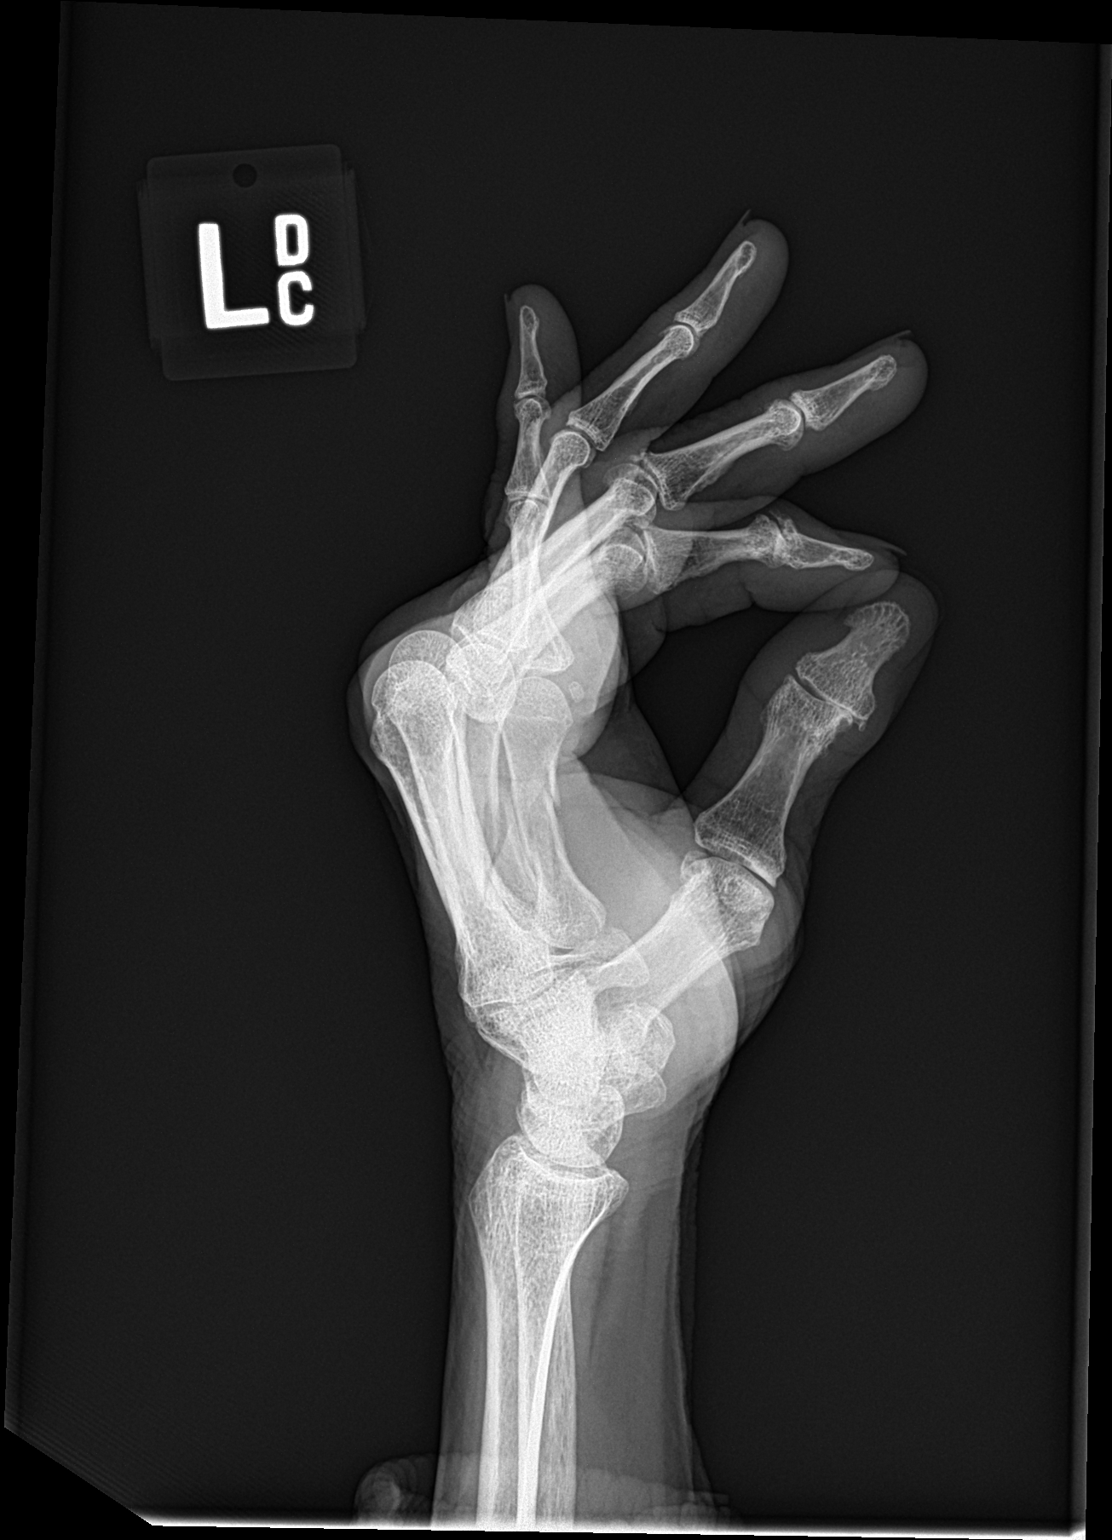

[3 of 3 positions shown; findings below may reference images not displayed]

FINDINGS: Oblique fractures of the mid and proximal shafts of the fourth and
fifth metacarpals. Mild radial displacement of the distal fragment
of the fifth metacarpal fracture and mild dorsal displacement and
ventral angulation of the distal fragment of the fourth metacarpal
fracture. There is also an essentially nondisplaced fracture in the
base of the third proximal phalanx without visible intra-articular
extension. There are degenerative changes involving multiple
interphalangeal joints.
IMPRESSION: 1. Fractures of the fourth and fifth metacarpals and base of the
third proximal phalanx, as described above.
2. Mild osteoarthritis involving multiple interphalangeal joints.

## 2018-12-17 ENCOUNTER — Other Ambulatory Visit: Payer: Self-pay | Admitting: *Deleted

## 2019-04-13 IMAGING — CT CT MAXILLOFACIAL W/O CM
4 of 6 series · 17 of 47 positions shown, 19 images · non-contrast
Comparison: 10/15/2016 and previous

CLINICAL DATA: Reports of multiple falls after tripping at home.
[place] laceration noted above left eye, right side of lip and bruising
noted to left side of face.; no loc;

EXAM:
CT HEAD WITHOUT CONTRAST
CT MAXILLOFACIAL WITHOUT CONTRAST
TECHNIQUE: Multidetector CT imaging of the head and maxillofacial structures
were performed using the standard protocol without intravenous
contrast. Multiplanar CT image reconstructions of the maxillofacial
structures were also generated.

[Series 3: head wo · axial · 0.43mm/px · z∈[+113,+213]mm · 6 of 30 slices shown, 8 images]
[im 5/30  brain]
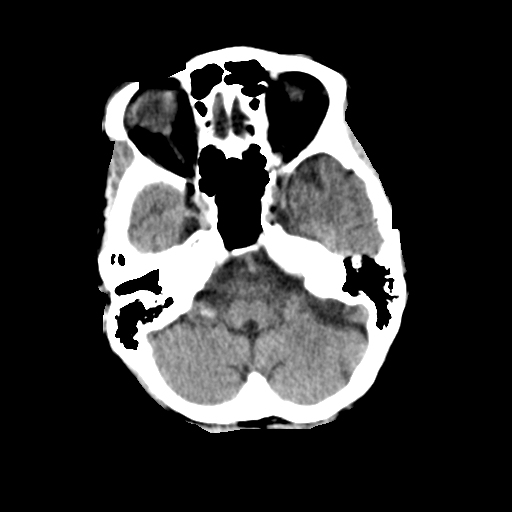
[im 5/30  bone]
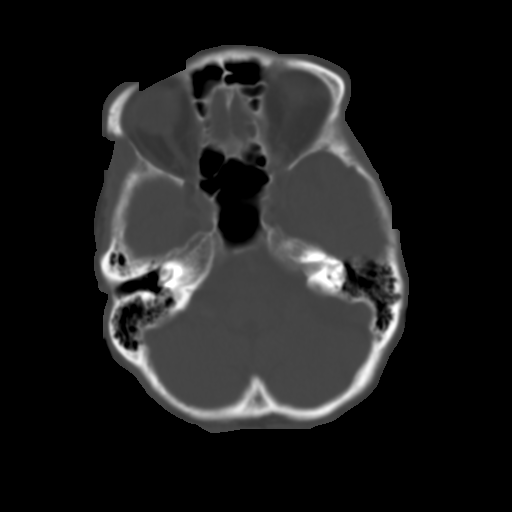
[im 9/30  bone]
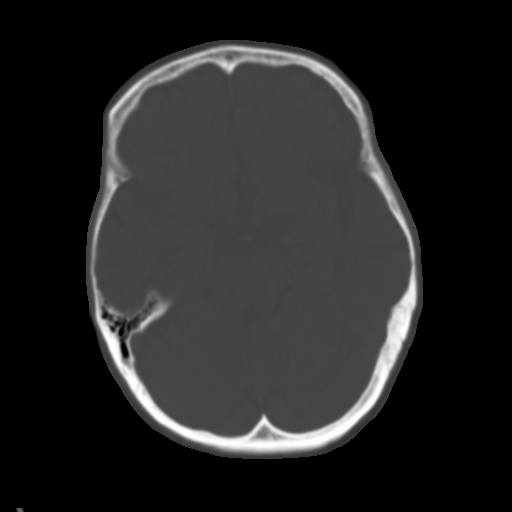
[im 13/30  bone]
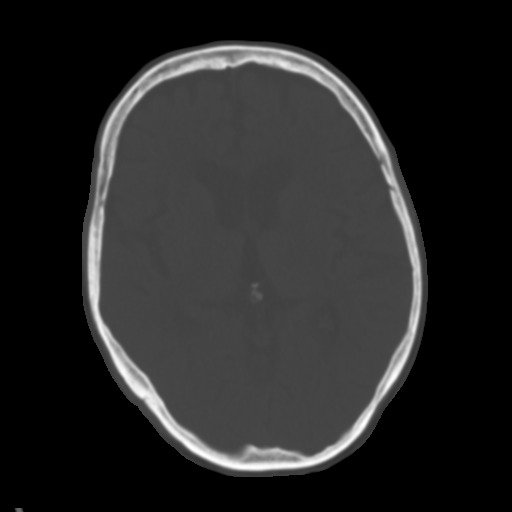
[im 17/30  bone]
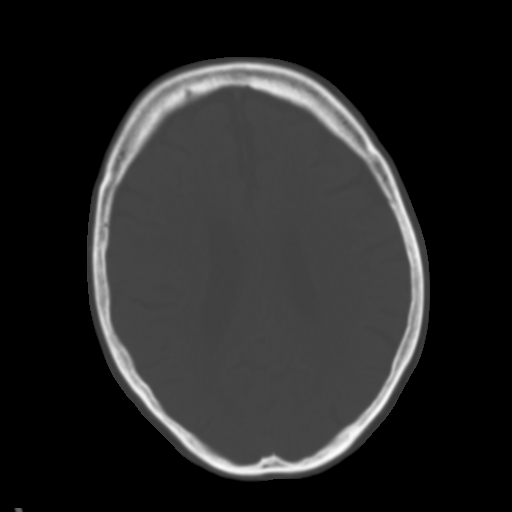
[im 21/30  brain]
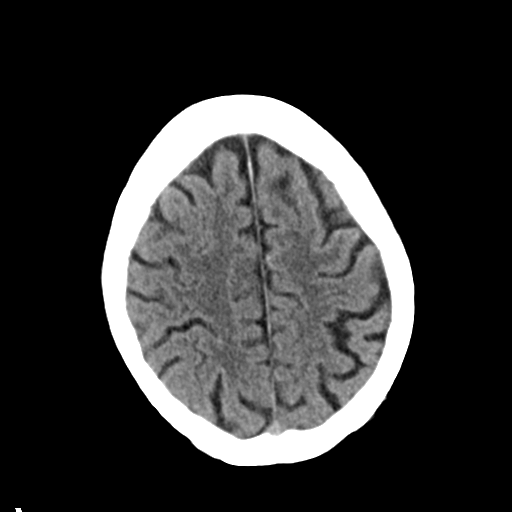
[im 21/30  bone]
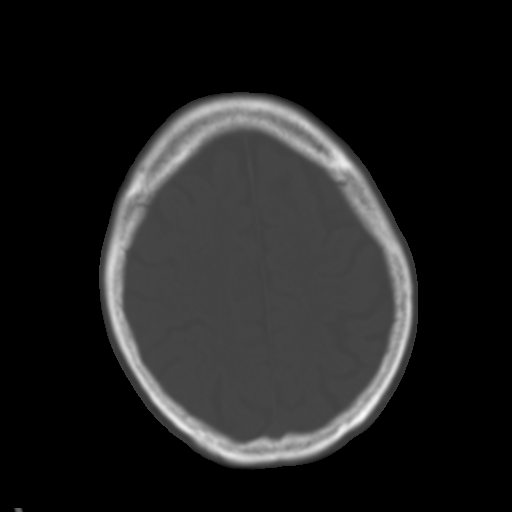
[im 25/30  bone]
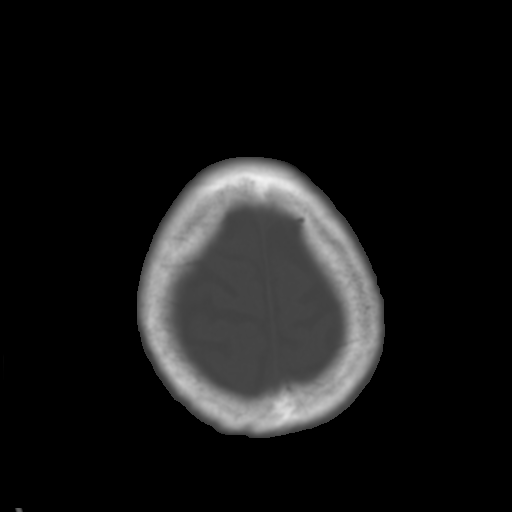

[Series 7: max soft · axial · 0.33mm/px · z∈[-8,+82]mm · 6 of 80 slices shown]
[im 8/80  brain]
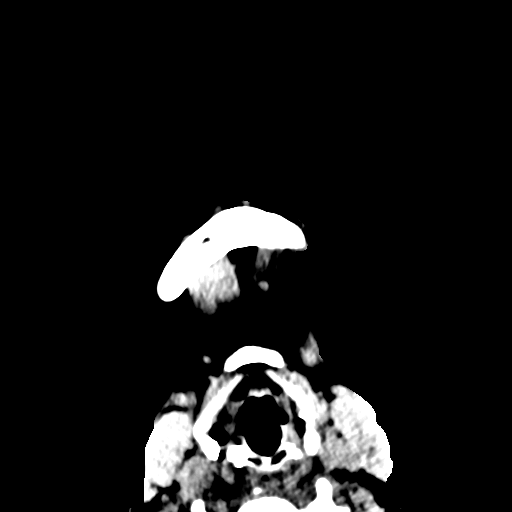
[im 16/80  brain]
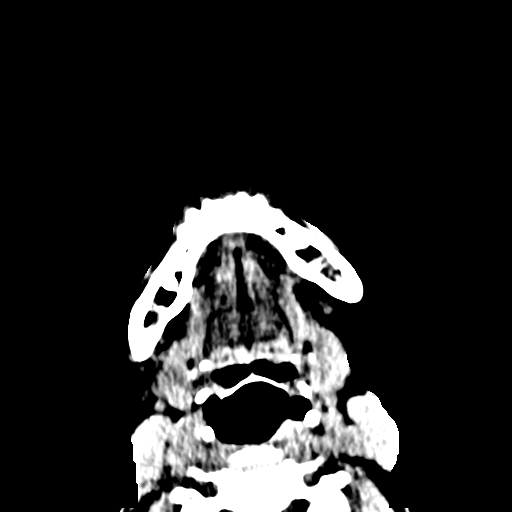
[im 27/80  brain]
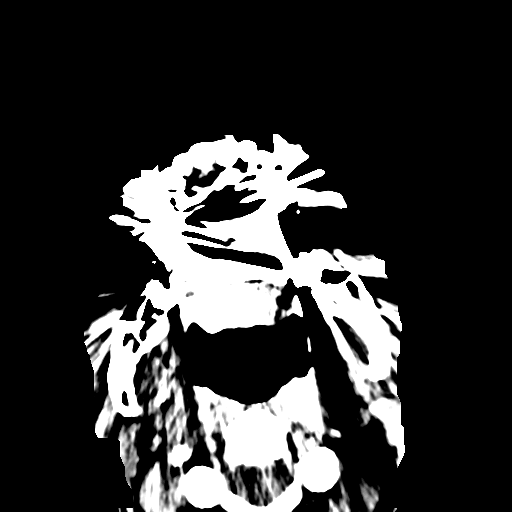
[im 34/80  brain]
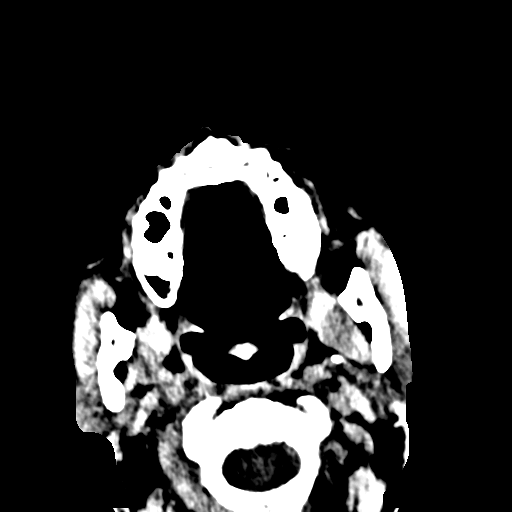
[im 46/80  brain]
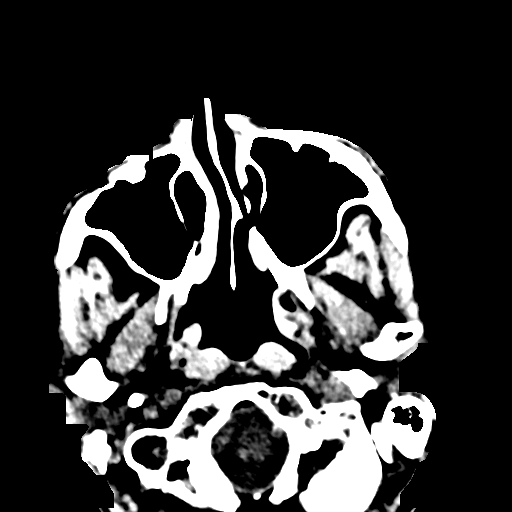
[im 53/80  brain]
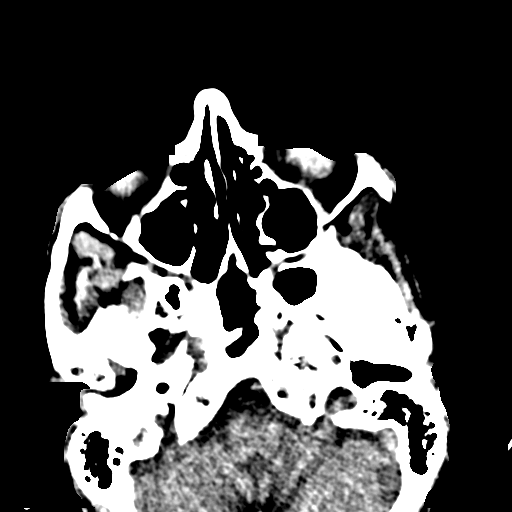

[Series 11: coronal soft · coronal · 0.32mm/px · 3 of 74 slices shown]
[im 4/74  bone]
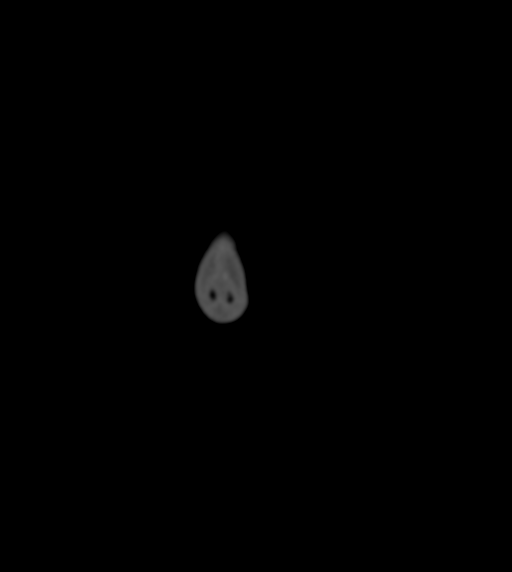
[im 21/74  bone]
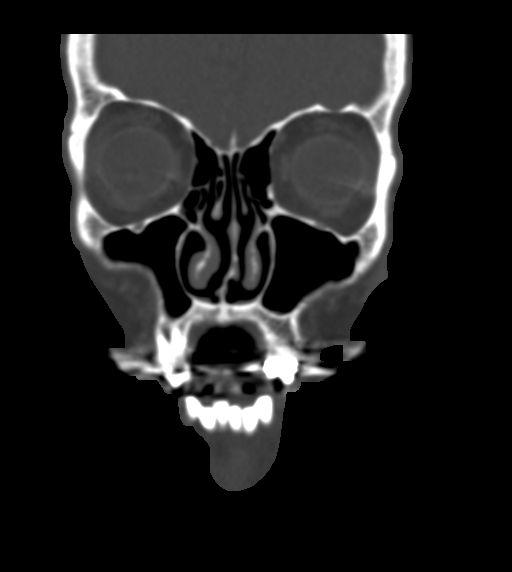
[im 39/74  bone]
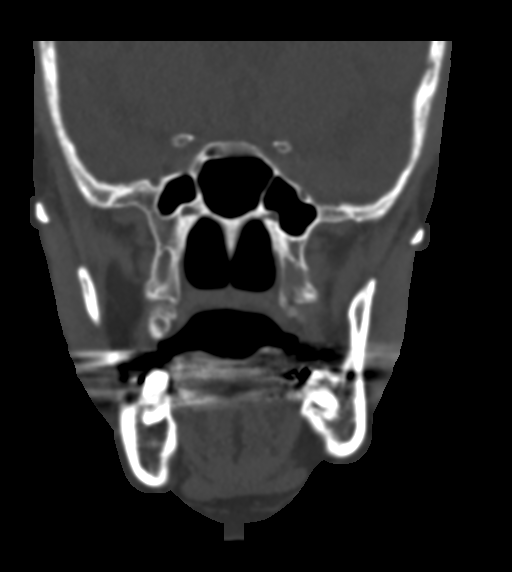

[Series 12: sagittal soft · sagittal · 0.30mm/px · 2 of 73 slices shown]
[im 25/73  bone]
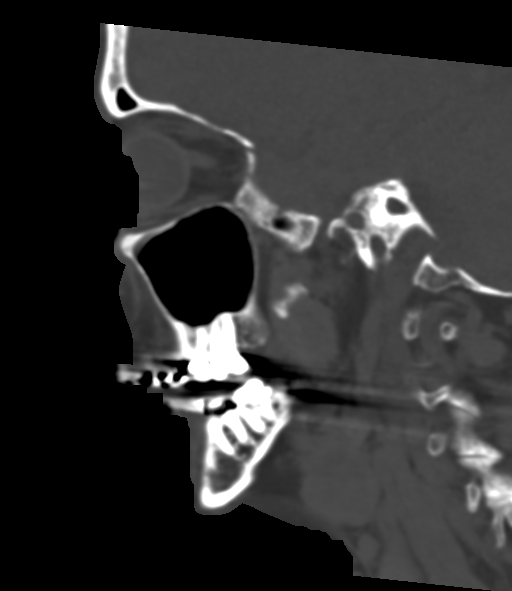
[im 49/73  bone]
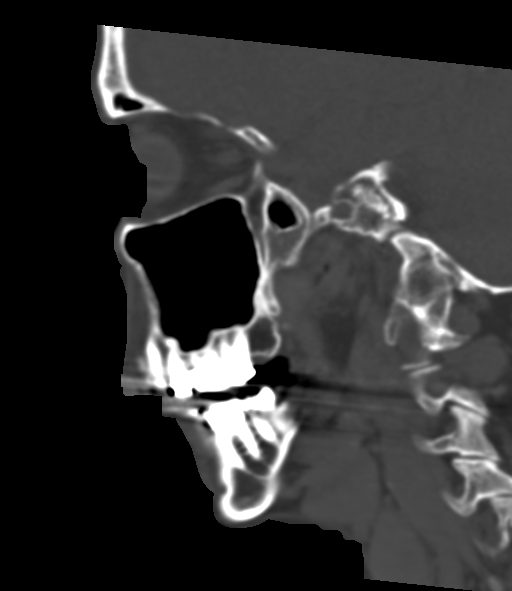

[17 of 47 positions shown; findings below may reference images not displayed]

FINDINGS: CT HEAD FINDINGS

Brain: Mild atrophy. No evidence of acute infarction, hemorrhage,
hydrocephalus, extra-axial collection or mass lesion/mass effect.

Vascular: Atherosclerotic and physiologic intracranial
calcifications.

Skull: Normal. Negative for fracture or focal lesion.

Other: None

CT MAXILLOFACIAL FINDINGS

Osseous: No fracture or mandibular dislocation. No destructive
process. Dental restorations.

Orbits: Negative. No traumatic or inflammatory finding.

Sinuses: Clear.

Soft tissues: Negative.
IMPRESSION: 1. Mild atrophy without bleed or other acute intracranial process.
2. Negative maxillofacial CT.

## 2019-12-04 ENCOUNTER — Emergency Department (HOSPITAL_COMMUNITY)
Admission: EM | Admit: 2019-12-04 | Discharge: 2019-12-05 | Disposition: A | Discharge status: Home / Self Care | Payer: Medicare PPO | Attending: Emergency Medicine | Admitting: Emergency Medicine

## 2019-12-04 ENCOUNTER — Emergency Department (HOSPITAL_COMMUNITY): Payer: Medicare PPO

## 2019-12-04 DIAGNOSIS — G20 Parkinson's disease: Secondary | ICD-10-CM | POA: Diagnosis not present

## 2019-12-04 DIAGNOSIS — W06XXXA Fall from bed, initial encounter: Secondary | ICD-10-CM | POA: Diagnosis not present

## 2019-12-04 DIAGNOSIS — R296 Repeated falls: Secondary | ICD-10-CM | POA: Insufficient documentation

## 2019-12-04 DIAGNOSIS — S065X0D Traumatic subdural hemorrhage without loss of consciousness, subsequent encounter: Secondary | ICD-10-CM | POA: Diagnosis not present

## 2019-12-04 DIAGNOSIS — S065X9A Traumatic subdural hemorrhage with loss of consciousness of unspecified duration, initial encounter: Secondary | ICD-10-CM

## 2019-12-04 DIAGNOSIS — S065XAA Traumatic subdural hemorrhage with loss of consciousness status unknown, initial encounter: Secondary | ICD-10-CM

## 2019-12-04 DIAGNOSIS — Z20822 Contact with and (suspected) exposure to covid-19: Secondary | ICD-10-CM | POA: Insufficient documentation

## 2019-12-04 DIAGNOSIS — S40211A Abrasion of right shoulder, initial encounter: Secondary | ICD-10-CM | POA: Insufficient documentation

## 2019-12-04 DIAGNOSIS — F0390 Unspecified dementia without behavioral disturbance: Secondary | ICD-10-CM | POA: Diagnosis not present

## 2019-12-04 DIAGNOSIS — Z79899 Other long term (current) drug therapy: Secondary | ICD-10-CM | POA: Diagnosis not present

## 2019-12-04 DIAGNOSIS — Z66 Do not resuscitate: Secondary | ICD-10-CM | POA: Diagnosis not present

## 2019-12-04 DIAGNOSIS — S0990XA Unspecified injury of head, initial encounter: Secondary | ICD-10-CM | POA: Diagnosis present

## 2019-12-04 DIAGNOSIS — Y92129 Unspecified place in nursing home as the place of occurrence of the external cause: Secondary | ICD-10-CM | POA: Diagnosis not present

## 2019-12-04 DIAGNOSIS — W19XXXA Unspecified fall, initial encounter: Secondary | ICD-10-CM

## 2019-12-04 DIAGNOSIS — Y999 Unspecified external cause status: Secondary | ICD-10-CM | POA: Insufficient documentation

## 2019-12-04 DIAGNOSIS — Y9389 Activity, other specified: Secondary | ICD-10-CM | POA: Diagnosis not present

## 2019-12-04 NOTE — ED Notes (Signed)
Pt transported to CT ?

## 2019-12-04 NOTE — ED Provider Notes (Signed)
[facility] EMERGENCY DEPARTMENT Provider Note   CSN: [id number] Arrival date & time: 12/04/19  2155     History Chief Complaint  Patient presents with  . Fall    Wendy Reynolds is a 79 y.o. female.  Patient with history of dementia, Parkinson's, HTN, HLD, hypothyroid, frequent falls presents from [facility] after unwitnessed fall from bed. Nursing home reports her roommate called staff after she fell and staff found her awake and on the floor. She denied pain to them. No vomiting. The patient is a new resident having arrived today, but nursing reports mental status unchanged after the fall. She has abrasion to right shoulder. The patient is largely nonverbal and does not contribute significantly to history. She denies significant pain, but reports soreness. There has been no reported vomiting. Chart/information from NF state the patient is fully dependent for ADL's, non-ambulatory, DNR.   The history is provided by the EMS personnel and the nursing home. No language interpreter was used.  Fall       Past Medical History:  Diagnosis Date  . Alcohol use   . Chest pain 03/04/2012   MI ruled out. Likely anxiety related.  . Depression   . Diastolic dysfunction 03/05/2012   Grade 1. EF 60%  . Falls   . Generalized anxiety disorder 03/04/2012  . Hyperlipidemia 03/05/2012  . Hypertension   . Hypothyroid   . Panic   . Parkinson disease (HCC)   . Parkinson's disease [facility])     Patient Active Problem List   Diagnosis Date Noted  . UTI (lower urinary tract infection) 09/15/2014  . Acute kidney injury (HCC) 09/15/2014  . Acute encephalopathy 09/15/2014  . Orthostatic hypotension 09/15/2014  . Hypoglycemia 09/15/2014  . Frequent falls   . Renal insufficiency 09/14/2014  . Parkinson's disease (tremor, stiffness, slow motion, unstable posture) (HCC) 05/15/2013  . 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 Diastolic  dysfunction 03/05/2012  . Hyperlipidemia 03/05/2012  . Chest pain 03/04/2012  . Generalized anxiety disorder 03/04/2012  . HTN (hypertension) 03/04/2012  . Depression 03/04/2012    Past Surgical History:  Procedure Laterality Date  . AUGMENTATION MAMMAPLASTY Bilateral   . DILATION AND CURETTAGE OF UTERUS       OB History    Gravida  2   Para      Term      Preterm      AB      Living  2     SAB      TAB      Ectopic      Multiple      Live Births              Family History  Problem Relation Age of Onset  . Depression Maternal Grandfather   . Depression Cousin   . Alcohol abuse Cousin   . Depression Daughter   . Alcohol abuse Mother   . Alcohol abuse Father   . Alcohol abuse Maternal Aunt     Social History   Tobacco Use  . Smoking status: Never Smoker  . Smokeless tobacco: Never Used  Substance Use Topics  . Alcohol use: Yes    Comment: 2 glasses a night  . Drug use: No    Home Medications Prior to Admission medications   Medication Sig Start Date End Date Taking? Authorizing Provider  carbidopa-levodopa (SINEMET IR) 25-100 MG tablet Take 1 tablet by mouth 3 (three)  times daily.     [provider]  cholecalciferol (VITAMIN D) 1000 units tablet Take 1,000 Units by mouth daily.    [provider]  clonazePAM (KLONOPIN) 1 MG tablet Take 1-1.5 mg by mouth daily as needed for anxiety.     [provider]  DULoxetine (CYMBALTA) 60 MG capsule Take 1 capsule (60 mg total) by mouth daily. 09/06/15   Cloria Spring, MD  hydrOXYzine (ATARAX/VISTARIL) 50 MG tablet Take 50-100 mg by mouth at bedtime as needed for anxiety (for sleep).     [provider]  ibuprofen (ADVIL,MOTRIN) 400 MG tablet Take 1 tablet (400 mg total) by mouth every 6 (six) hours as needed. 03/09/17   Noemi Chapel, MD  levothyroxine (SYNTHROID, LEVOTHROID) 50 MCG tablet Take 50 mcg by mouth daily before breakfast.     [provider]    lisinopril (PRINIVIL,ZESTRIL) 5 MG tablet Take 5 mg by mouth daily.  03/02/15   [provider]  Multiple Vitamin (MULTIVITAMIN WITH MINERALS) TABS tablet Take 1 tablet by mouth daily.    [provider]  Omega-3 Fatty Acids (FISH OIL) 1000 MG CAPS Take 1 capsule by mouth daily.    [provider]    Allergies    Erythromycin and Penicillins  Review of Systems   Review of Systems  Unable to perform ROS: Dementia    Physical Exam Updated Vital Signs BP 125/65 (BP Location: Left Arm)   Pulse 76   Temp 98.7 F (37.1 C) (Oral)   Resp 18   SpO2 100%   Physical Exam Vitals and nursing note reviewed.  Constitutional:      General: She is not in acute distress.    Appearance: She is well-developed.  HENT:     Head:     Comments: No scalp wound or significant hematoma about the scalp. There is a minor swelling to left forehead without wound or abrasion.  Cardiovascular:     Rate and Rhythm: Normal rate.  Pulmonary:     Effort: Pulmonary effort is normal. No respiratory distress.  Chest:     Chest wall: No tenderness.  Abdominal:     Palpations: Abdomen is soft.     Tenderness: There is no abdominal tenderness.  Musculoskeletal:     Cervical back: Normal range of motion.     Comments: Moves all extremities voluntarily. No bony deformities. Moves LE's from flexed to fully extended without difficulty or obvious discomfort.   Skin:    General: Skin is warm and dry.  Neurological:     Mental Status: She is alert.     Comments: Minimally verbal. Does not follow command.      ED Results / Procedures / Treatments   Labs (all labs ordered are listed, but only abnormal results are displayed) Labs Reviewed - No data to display  EKG None  Radiology No results found.  Procedures Procedures (including critical care time)  Medications Ordered in ED Medications - No data to display  ED Course  I have reviewed the triage vital signs and the nursing  notes.  Pertinent labs & imaging results that were available during my care of the patient were reviewed by me and considered in my medical decision making (see chart for details).  Clinical Course as of Dec 04 357  Sat Dec 05, 2019  0041 I received a return phone call from daughter who provides additional history. She was diagnosed with Subdural one week ago, treated conservatively at [facility] and  sent to [facility] today. She is aware that a midline shift was involved but does not know the measurement. She confirms the patient is DNR status and that surgery is not something that would be pursued by the family. No records in Care Everywhere.    [SU]  0045 The daughter, given the option of observation admission in the hospital vs discharge back to the nursing home for observation for change in mental status, states she prefers discharge back to the nursing home for observation for mental status change.     [SU]  [id number] Patient has been resting quietly.   Records were obtained by [facility] for comparison with tonight's CT and shows the SDH increased from 11 mm to 18 mm tonight, and the midline shift increased from 4 mm to 11 mm tonight. These results were discussed with neurosurgery who advised that neither observation nor intervention are indicated. She can be discharged back to [facility]. Will attempt to call Jonnie Finner to update her as to the CT findings. [facility] called for transport.    [SU]    Clinical Course User Index [SU] [place]   MDM Rules/Calculators/A&P                      Patient to ED from [facility] after unwitnessed fall from bed. The patient is awake, states only that she is sore but denies significant or focal pain. Moves all extremities.   The patient has a subdural hemorrhage on CT scan that measures 18 mm with a 10 mm midline shift to the right that has the appearance of being subacute. Paperwork from the nursing home  reports she has a history of traumatic subdural hematoma but no other information is available. Attempts to contact daughter, Jailey Booton, unsuccessful. (see above notes)  The patient can be transported back to [facility].    Final Clinical Impression(s) / ED Diagnoses Final diagnoses:  None   1. Subdural hematoma 2. Dementia   Rx / DC Orders ED Discharge Orders    None       Elpidio Anis, PA-C 12/05/19 0400    Glynn Octave, MD 12/05/19 2257

## 2019-12-04 NOTE — ED Triage Notes (Signed)
Patient arrived via [facility] from [address] [facility] facility for fall. Hit head with bump, laceration to right shoulder. Patient c/o soreness. History of subdural hematoma. VS BP 134/74, HR 60's, CBG 122. Patient has DNR paperwork.

## 2019-12-05 LAB — CBC WITH DIFFERENTIAL/PLATELET
Abs Immature Granulocytes: 0.03 10*3/uL (ref 0.00–0.07)
Basophils Absolute: 0.1 10*3/uL (ref 0.0–0.1)
Basophils Relative: 1 %
Eosinophils Absolute: 0.1 10*3/uL (ref 0.0–0.5)
Eosinophils Relative: 1 %
HCT: 31.5 % — ABNORMAL LOW (ref 36.0–46.0)
Hemoglobin: 9.5 g/dL — ABNORMAL LOW (ref 12.0–15.0)
Immature Granulocytes: 0 %
Lymphocytes Relative: 11 %
Lymphs Abs: 0.9 10*3/uL (ref 0.7–4.0)
MCH: 27.5 pg (ref 26.0–34.0)
MCHC: 30.2 g/dL (ref 30.0–36.0)
MCV: 91.3 fL (ref 80.0–100.0)
Monocytes Absolute: 0.4 10*3/uL (ref 0.1–1.0)
Monocytes Relative: 6 %
Neutro Abs: 6.3 10*3/uL (ref 1.7–7.7)
Neutrophils Relative %: 81 %
Platelets: 468 10*3/uL — ABNORMAL HIGH (ref 150–400)
RBC: 3.45 MIL/uL — ABNORMAL LOW (ref 3.87–5.11)
RDW: 14.7 % (ref 11.5–15.5)
WBC: 7.8 10*3/uL (ref 4.0–10.5)
nRBC: 0 % (ref 0.0–0.2)

## 2019-12-05 LAB — BASIC METABOLIC PANEL
Anion gap: 11 (ref 5–15)
BUN: 23 mg/dL (ref 8–23)
CO2: 23 mmol/L (ref 22–32)
Calcium: 8.9 mg/dL (ref 8.9–10.3)
Chloride: 100 mmol/L (ref 98–111)
Creatinine, Ser: 0.74 mg/dL (ref 0.44–1.00)
GFR calc Af Amer: >60 mL/min (ref >60)
GFR calc non Af Amer: >60 mL/min (ref >60)
Glucose, Bld: 95 mg/dL (ref 70–99)
Potassium: 4.4 mmol/L (ref 3.5–5.1)
Sodium: 134 mmol/L — ABNORMAL LOW (ref 135–145)

## 2019-12-05 LAB — RESPIRATORY PANEL BY RT PCR (FLU A&B, COVID)
Influenza A by PCR: NEGATIVE
Influenza B by PCR: NEGATIVE
SARS Coronavirus 2 by RT PCR: NEGATIVE

## 2019-12-05 NOTE — ED Provider Notes (Signed)
Records arrived from [place].  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  Subdural hematoma was initially 11 mm with 4 mm of midline shift on April 21. Subdural fluid collection today is 18 mm with 10 mm of midline shift.  Discussed with Dr. Maisie Fus of neurosurgery.  He feels this is a chronic issue and does not need any neurosurgical intervention.  Recommends follow-up with [facility].  Does not feel she needs to stay in the hospital overnight or have repeat imaging.   Glynn Octave, MD 12/05/19 [phone]

## 2019-12-05 NOTE — ED Notes (Signed)
Pt makes eye contact but answers all questions  With a yes or no response  If she tries to answer the  Speech is garbled and makes no sense  She does not follow commands

## 2019-12-05 NOTE — ED Notes (Signed)
Medical Record requested from [facility]

## 2019-12-05 NOTE — ED Notes (Signed)
REPORT CALLED TO CRYSTAL AT [facility]  PT WAITING FOR [facility] TO ARRIVE

## 2019-12-05 NOTE — ED Notes (Signed)
Pt placed on bed alarm for safety.   

## 2019-12-05 NOTE — ED Notes (Signed)
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 MAYBE HAS HELPED  STIL  CONFUSED AND DISORIENTED

## 2019-12-05 NOTE — ED Notes (Signed)
Called [facility], patient was place on the list

## 2019-12-05 NOTE — Discharge Instructions (Addendum)
The CT scan done here tonight shows a slightly larger SDH than when compared to most recent at [facility], with a slightly larger midline shift. These results were discussed with neurosurgery tonight who advised that no further intervention is indicated.   The nursing home should monitor for significant mental status changes or decline. Follow up with [facility] is recommended for further medical care as needed. Return here as needed.

## 2019-12-05 NOTE — ED Notes (Signed)
Pt brief was changed and pt cleaned up and is dry. Bed alarm on for safety and in view of nurses at station.

## 2019-12-29 DEATH — deceased
